# Patient Record
Sex: Female | Born: 1970
Health system: Southern US, Community
[De-identification: ages and names within clinical notes are randomized; demographics above are authoritative.]

## PROBLEM LIST (undated history)

## (undated) DIAGNOSIS — I10 Essential (primary) hypertension: Secondary | ICD-10-CM

## (undated) DIAGNOSIS — R55 Syncope and collapse: Secondary | ICD-10-CM

## (undated) DIAGNOSIS — E0590 Thyrotoxicosis, unspecified without thyrotoxic crisis or storm: Secondary | ICD-10-CM

## (undated) DIAGNOSIS — K047 Periapical abscess without sinus: Secondary | ICD-10-CM

## (undated) DIAGNOSIS — K589 Irritable bowel syndrome without diarrhea: Secondary | ICD-10-CM

## (undated) DIAGNOSIS — E0500 Thyrotoxicosis with diffuse goiter without thyrotoxic crisis or storm: Secondary | ICD-10-CM

## (undated) DIAGNOSIS — E039 Hypothyroidism, unspecified: Secondary | ICD-10-CM

## (undated) DIAGNOSIS — F419 Anxiety disorder, unspecified: Secondary | ICD-10-CM

## (undated) DIAGNOSIS — M549 Dorsalgia, unspecified: Secondary | ICD-10-CM

## (undated) DIAGNOSIS — N809 Endometriosis, unspecified: Secondary | ICD-10-CM

## (undated) DIAGNOSIS — R739 Hyperglycemia, unspecified: Secondary | ICD-10-CM

## (undated) DIAGNOSIS — F4321 Adjustment disorder with depressed mood: Secondary | ICD-10-CM

## (undated) DIAGNOSIS — Z87442 Personal history of urinary calculi: Secondary | ICD-10-CM

## (undated) DIAGNOSIS — E559 Vitamin D deficiency, unspecified: Secondary | ICD-10-CM

## (undated) DIAGNOSIS — Z87440 Personal history of urinary (tract) infections: Secondary | ICD-10-CM

## (undated) HISTORY — DX: Hyperglycemia, unspecified: R73.9

## (undated) HISTORY — DX: Essential (primary) hypertension: I10

## (undated) HISTORY — DX: Endometriosis, unspecified: N80.9

## (undated) HISTORY — DX: Syncope and collapse: R55

## (undated) HISTORY — DX: Dorsalgia, unspecified: M54.9

## (undated) HISTORY — DX: Vitamin D deficiency, unspecified: E55.9

## (undated) HISTORY — DX: Anxiety disorder, unspecified: F41.9

## (undated) HISTORY — DX: Thyrotoxicosis, unspecified without thyrotoxic crisis or storm: E05.90

## (undated) HISTORY — DX: Personal history of urinary (tract) infections: Z87.440

## (undated) HISTORY — DX: Irritable bowel syndrome, unspecified: K58.9

## (undated) HISTORY — PX: ABDOMINAL HYSTERECTOMY: SHX81

## (undated) HISTORY — DX: Personal history of urinary calculi: Z87.442

## (undated) HISTORY — DX: Adjustment disorder with depressed mood: F43.21

## (undated) HISTORY — DX: Periapical abscess without sinus: K04.7

## (undated) HISTORY — DX: Thyrotoxicosis with diffuse goiter without thyrotoxic crisis or storm: E05.00

---
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
## 1996-07-19 HISTORY — PX: OTHER SURGICAL HISTORY: SHX169

## 2001-07-19 DIAGNOSIS — Z87442 Personal history of urinary calculi: Secondary | ICD-10-CM

## 2001-07-19 HISTORY — DX: Personal history of urinary calculi: Z87.442

## 2007-01-17 HISTORY — PX: ENDOMETRIAL ABLATION: SHX621

## 2007-04-25 ENCOUNTER — Other Ambulatory Visit: Payer: Self-pay

## 2007-04-26 ENCOUNTER — Inpatient Hospital Stay: Payer: Self-pay

## 2008-05-16 ENCOUNTER — Inpatient Hospital Stay (HOSPITAL_COMMUNITY): Admission: AD | Admit: 2008-05-16 | Discharge: 2008-05-17 | Payer: Self-pay | Admitting: Obstetrics & Gynecology

## 2008-06-12 ENCOUNTER — Ambulatory Visit: Payer: Self-pay | Admitting: Obstetrics & Gynecology

## 2008-07-19 HISTORY — PX: VAGINAL HYSTERECTOMY: SHX2639

## 2008-08-08 ENCOUNTER — Ambulatory Visit: Payer: Self-pay | Admitting: Obstetrics and Gynecology

## 2008-08-08 ENCOUNTER — Encounter: Payer: Self-pay | Admitting: Obstetrics & Gynecology

## 2008-08-09 ENCOUNTER — Encounter: Payer: Self-pay | Admitting: Obstetrics and Gynecology

## 2008-08-29 ENCOUNTER — Ambulatory Visit: Payer: Self-pay | Admitting: Obstetrics & Gynecology

## 2008-10-08 ENCOUNTER — Ambulatory Visit (HOSPITAL_COMMUNITY): Admission: RE | Admit: 2008-10-08 | Discharge: 2008-10-09 | Payer: Self-pay | Admitting: Obstetrics & Gynecology

## 2008-10-08 ENCOUNTER — Ambulatory Visit: Payer: Self-pay | Admitting: Obstetrics & Gynecology

## 2008-10-08 ENCOUNTER — Encounter: Payer: Self-pay | Admitting: Obstetrics & Gynecology

## 2008-10-15 ENCOUNTER — Ambulatory Visit: Payer: Self-pay | Admitting: Obstetrics & Gynecology

## 2008-10-23 ENCOUNTER — Ambulatory Visit: Payer: Self-pay | Admitting: Obstetrics and Gynecology

## 2008-11-13 ENCOUNTER — Ambulatory Visit: Payer: Self-pay | Admitting: Obstetrics & Gynecology

## 2008-11-14 ENCOUNTER — Encounter: Payer: Self-pay | Admitting: Obstetrics & Gynecology

## 2008-11-14 LAB — CONVERTED CEMR LAB: GC Probe Amp, Genital: NEGATIVE

## 2008-11-15 ENCOUNTER — Encounter: Payer: Self-pay | Admitting: Obstetrics & Gynecology

## 2008-11-15 LAB — CONVERTED CEMR LAB
Clue Cells Wet Prep HPF POC: NONE SEEN
Trich, Wet Prep: NONE SEEN

## 2009-07-03 ENCOUNTER — Ambulatory Visit: Payer: Self-pay | Admitting: General Practice

## 2009-11-06 ENCOUNTER — Other Ambulatory Visit: Payer: Self-pay | Admitting: Physician Assistant

## 2010-02-02 ENCOUNTER — Inpatient Hospital Stay (HOSPITAL_COMMUNITY): Admission: AD | Admit: 2010-02-02 | Discharge: 2010-02-02 | Payer: Self-pay | Admitting: Obstetrics & Gynecology

## 2010-02-11 ENCOUNTER — Ambulatory Visit: Payer: Self-pay | Admitting: Obstetrics and Gynecology

## 2010-02-20 ENCOUNTER — Ambulatory Visit (HOSPITAL_COMMUNITY): Admission: RE | Admit: 2010-02-20 | Discharge: 2010-02-20 | Payer: Self-pay | Admitting: Family Medicine

## 2010-02-26 ENCOUNTER — Ambulatory Visit: Payer: Self-pay | Admitting: Obstetrics & Gynecology

## 2010-06-08 ENCOUNTER — Ambulatory Visit: Payer: Self-pay | Admitting: Internal Medicine

## 2010-06-08 ENCOUNTER — Inpatient Hospital Stay: Payer: Self-pay | Admitting: Internal Medicine

## 2010-06-14 ENCOUNTER — Encounter: Payer: Self-pay | Admitting: Internal Medicine

## 2010-06-16 ENCOUNTER — Telehealth: Payer: Self-pay | Admitting: Internal Medicine

## 2010-06-29 ENCOUNTER — Ambulatory Visit (HOSPITAL_COMMUNITY)
Admission: RE | Admit: 2010-06-29 | Discharge: 2010-06-29 | Payer: Self-pay | Source: Home / Self Care | Attending: Internal Medicine | Admitting: Internal Medicine

## 2010-07-07 ENCOUNTER — Ambulatory Visit: Payer: Self-pay | Admitting: Unknown Physician Specialty

## 2010-07-15 ENCOUNTER — Ambulatory Visit: Payer: Self-pay | Admitting: Vascular Surgery

## 2010-08-09 ENCOUNTER — Encounter: Payer: Self-pay | Admitting: *Deleted

## 2010-08-18 NOTE — Progress Notes (Signed)
Summary: Table test  Phone Note Call from Patient Call back at Home Phone [phone]   Caller: self Call For: Graciela Husbands Summary of Call: Pt was discharged from [facility] on Sunday and was told by Graciela Husbands that she needed to have a table test performed-She is calling to schedule. Initial call taken by: Harlon Flor,  June 16, 2010 2:45 PM  Follow-up for Phone Call        Attempted to call Tresa Endo or Bjorn Loser to schedule TTS, out of office today.  Attempted to contact pt, LMOM TCB.  Working on scheduling appt. Follow-up by: Cloyde Reams RN,  June 18, 2010 8:46 AM  Additional Follow-up for Phone Call Additional follow up Details #1::        Spoke with pt, she is aware we are working on scheduling TTS Cloyde Reams RN  June 18, 2010 9:42 AM   pt calling back wanting to know when she can be set up for the tilt table test? pls call [phone] Glynda Jaeger  June 24, 2010 1:42 PM  Called EP lab scheduled TTS for 06/29/10 at 10am pt arrival time 8 am. Pt notified of TTS appt. Pt instructions given over the phone. Additional Follow-up by: Lanny Hurst RN,  June 24, 2010 2:19 PM

## 2010-08-20 NOTE — Letter (Signed)
SummaryScientist, [facility]   [facility]   Imported By: Roderic Ovens 07/01/2010 10:31:28  _____________________________________________________________________  External Attachment:    Type:   Image     Comment:   External Document

## 2010-09-10 ENCOUNTER — Emergency Department (HOSPITAL_COMMUNITY)
Admission: EM | Admit: 2010-09-10 | Discharge: 2010-09-10 | Disposition: A | Discharge status: Home / Self Care | Payer: PRIVATE HEALTH INSURANCE | Attending: Emergency Medicine | Admitting: Emergency Medicine

## 2010-09-10 DIAGNOSIS — T148XXA Other injury of unspecified body region, initial encounter: Secondary | ICD-10-CM | POA: Insufficient documentation

## 2010-09-10 DIAGNOSIS — S0510XA Contusion of eyeball and orbital tissues, unspecified eye, initial encounter: Secondary | ICD-10-CM | POA: Insufficient documentation

## 2010-09-10 DIAGNOSIS — E039 Hypothyroidism, unspecified: Secondary | ICD-10-CM | POA: Insufficient documentation

## 2010-09-10 DIAGNOSIS — H5710 Ocular pain, unspecified eye: Secondary | ICD-10-CM | POA: Insufficient documentation

## 2010-09-10 DIAGNOSIS — S1093XA Contusion of unspecified part of neck, initial encounter: Secondary | ICD-10-CM | POA: Insufficient documentation

## 2010-09-10 DIAGNOSIS — R0609 Other forms of dyspnea: Secondary | ICD-10-CM | POA: Insufficient documentation

## 2010-09-10 DIAGNOSIS — S0003XA Contusion of scalp, initial encounter: Secondary | ICD-10-CM | POA: Insufficient documentation

## 2010-09-10 DIAGNOSIS — R220 Localized swelling, mass and lump, head: Secondary | ICD-10-CM | POA: Insufficient documentation

## 2010-09-10 DIAGNOSIS — Z79899 Other long term (current) drug therapy: Secondary | ICD-10-CM | POA: Insufficient documentation

## 2010-09-10 DIAGNOSIS — R0989 Other specified symptoms and signs involving the circulatory and respiratory systems: Secondary | ICD-10-CM | POA: Insufficient documentation

## 2010-09-10 DIAGNOSIS — R221 Localized swelling, mass and lump, neck: Secondary | ICD-10-CM | POA: Insufficient documentation

## 2010-10-28 LAB — POCT URINALYSIS DIP (DEVICE)
Nitrite: NEGATIVE
Protein, ur: NEGATIVE mg/dL
Urobilinogen, UA: 0.2 mg/dL (ref 0.0–1.0)
pH: 6.5 (ref 5.0–8.0)

## 2010-10-29 LAB — CBC
HCT: 30 % — ABNORMAL LOW (ref 36.0–46.0)
HCT: 40 % (ref 36.0–46.0)
Hemoglobin: 10.3 g/dL — ABNORMAL LOW (ref 12.0–15.0)
Hemoglobin: 13.7 g/dL (ref 12.0–15.0)
MCHC: 34.3 g/dL (ref 30.0–36.0)
MCV: 93.5 fL (ref 78.0–100.0)
MCV: 95 fL (ref 78.0–100.0)
RBC: 3.15 MIL/uL — ABNORMAL LOW (ref 3.87–5.11)
RDW: 11.9 % (ref 11.5–15.5)
WBC: 12.8 10*3/uL — ABNORMAL HIGH (ref 4.0–10.5)
WBC: 8.6 10*3/uL (ref 4.0–10.5)

## 2010-10-29 LAB — TYPE AND SCREEN: Antibody Screen: NEGATIVE

## 2010-10-29 LAB — ABO/RH: ABO/RH(D): O POS

## 2010-11-02 LAB — POCT URINALYSIS DIP (DEVICE)
Glucose, UA: NEGATIVE mg/dL
Ketones, ur: NEGATIVE mg/dL
Urobilinogen, UA: 0.2 mg/dL (ref 0.0–1.0)

## 2010-11-03 LAB — POCT URINALYSIS DIP (DEVICE)
Glucose, UA: NEGATIVE mg/dL
Ketones, ur: NEGATIVE mg/dL

## 2010-12-01 NOTE — Group Therapy Note (Signed)
NAME:  Katie Henry, Katie Henry NO.:  [id number]   MEDICAL RECORD NO.:  [id number]          PATIENT TYPE:  WOC   LOCATION:  [facility]                   FACILITY:  [facility]   PHYSICIAN:  Allie Bossier, MD        DATE OF BIRTH:  12-14-1970   DATE OF SERVICE:                                  CLINIC NOTE   [name] is a 40 year old lady who was seen by Dr. Silas Flood on August 08, 2008.  She has scheduled her for a laparoscopic-assisted vaginal  hysterectomy and started her on Vicodin and Voltaren for her pelvic pain  as well as Xanax to help with her anxiety.  She comes in today for  followup on this.  She said that the anxiety is relieved with the Xanax  and she said that her surgery has been scheduled for March 23.  She does  report some dysuria.  Please note that she was treated for a UTI at her  last visit.  I will reculture her urine today.  Her urinalysis only  shows a small bit of leukocyte esterase today and negative nitrite.  She  will follow up as above.      Allie Bossier, MD     MCD/MEDQ  D:  08/29/2008  T:  08/29/2008  Job:  [id number]

## 2010-12-01 NOTE — Discharge Summary (Signed)
NAMESAYLER, MICKIEWICZ               ACCOUNT NO.:  [id number]   MEDICAL RECORD NO.:  [id number]          PATIENT TYPE:  INP   LOCATION:  9319                          [facility]   PHYSICIAN:  Norton Blizzard, MD    DATE OF BIRTH:  29-Nov-1970   DATE OF ADMISSION:  10/08/2008  DATE OF DISCHARGE:  10/09/2008                               DISCHARGE SUMMARY   ADMISSION DIAGNOSIS:  Chronic pelvic pain.   DISCHARGE DIAGNOSIS:  Endometriosis.   PROCEDURES:  Laparoscopic-assisted vaginal hysterectomy on October 08, 2008.   PERTINENT STUDIES:  Preoperative hemoglobin of 13.7, postoperative  hemoglobin of 10.3.   BRIEF HOSPITAL COURSE:  The patient is a 40 year old gravida 3, para 2-0-  1-2 with long history of chronic pelvic pain, who desires definitive  surgical management in the form of a hysterectomy.  The patient was  counseled regarding adding a laparoscopy to her vaginal hysterectomy in  order to rule out endometriosis during surgery.  Endometriosis was  diagnosed during laparoscopy and she had a total vaginal hysterectomy.  For further details of this operation please refer to separate dictated  operative report.  On postoperative day #1, the patient was doing well.  She had an uncomplicated postoperative course.  She ambulated, voided  without difficulty, was passing flatus and her pain was controlled on  oral pain medications.  She was also tolerating a regular diet.  The  patient was deemed stable for discharge to home.   DISCHARGE CONDITION:  Stable.   DISCHARGE MEDICATIONS:  1. Dilaudid 2-4 mg p.o. q.4 h. p.r.n. pain.  2. Ibuprofen 600 mg p.o. q.6 h. p.r.n. pain.  3. Colace 100 mg p.o. b.i.d. p.r.n. constipation.  4. Nizoral 2% cream topical b.i.d. p.r.n. for skin irritation.   DISCHARGE INSTRUCTIONS:  The patient was given routine postoperative  instructions and told to avoid sexual intercourse for 6 weeks.  She was  advised to avoid lifting anything greater than 15  pounds for the next 6  weeks and to shower but not to sit in pools of fluid (bath tubs,  swimming pools, hot tubs etc).  She was also instructed to keep her  umbilical incision clean and dry and to come to MAU for any abnormal  bleeding, incisional drainage, infection or any other postoperative  concerns.   FOLLOWUP APPOINTMENTS:  The patient has her 2-week postop check on October 23, 2008 at 1:45 p.m. and her postoperative examination on November 13, 2008  at 2:15 p.m.      Norton Blizzard, MD  Electronically Signed     UAD/MEDQ  D:  10/09/2008  T:  10/10/2008  Job:  [id number]

## 2010-12-01 NOTE — Group Therapy Note (Signed)
NAME:  Katie Henry, Katie Henry NO.:  [id number]   MEDICAL RECORD NO.:  [id number]          PATIENT TYPE:  WOC   LOCATION:  [facility]                   FACILITY:  [facility]   PHYSICIAN:  Johnella Moloney, MD        DATE OF BIRTH:  1970/11/05   DATE OF SERVICE:  11/13/2008                                  CLINIC NOTE   The patient is a 40 year old female who underwent LAVH for chronic  pelvic pain by Dr. Johnella Moloney.  The patient had benign hysterectomy  findings with some adenomyosis.  The patient was having some pain at the  umbilical site but this looks well-healed.  No erythema, no discharge.  The patient is still having some pain vaginally, has not had sexual  intercourse yet.  We went a UA, urine culture to make sure this is not  urinary tract infection.  The patient will go back to work next week and  is going to be cleared for physical activity.   PHYSICAL EXAMINATION:  VITAL SIGNS:  Temperature 97.2, pulse 70, blood  pressure 132/94, weight 173.4, height 63 inches.  GENERAL:  Well-nourished, well-developed, in no apparent distress.  ABDOMEN:  Soft, nontender, no organomegaly, no hernia, no rebound, no  guarding.  All umbilical incisions healing well.  Speculum exam Tanner  5.  Vagina pink.  However, there is more discharge than expected.  We  sent wet prep and GC/chlamydia.  Bimanual, I still feel suture in the  left apex and the patient is a little tender on exam.  I would recommend  that the patient not have sexual intercourse for 2 more weeks.   ASSESSMENT AND PLAN:  A 40 year old female postop from LAVH for  adenomyosis.  1. GC/chlamydia and wet prep.  2. Urinalysis and urine culture.  3. Return to work next week.  4. Hold off on sexual intercourse for 2 weeks.     ______________________________  Elsie Lincoln, MD    ______________________________  Johnella Moloney, MD    KL/MEDQ  D:  11/13/2008  T:  11/13/2008  Job:  [id number]

## 2010-12-01 NOTE — Group Therapy Note (Signed)
Katie Henry, Katie Henry               ACCOUNT NO.:  [id number]   MEDICAL RECORD NO.:  [id number]          PATIENT TYPE:  WOC   LOCATION:  [facility]                   [facility]ACILITY:  [facility]   PHYSICIAN:  Johnella Moloney, MD        DATE OF BIRTH:  [birth year]   DATE OF SERVICE:  08/08/2008                                  CLINIC NOTE   CHIEF COMPLAINT:  Chronic pelvic pain.   HISTORY OF PRESENT ILLNESS:  Patient is a 40 year old gravida 3, para 2-  0-1-2 who is here today for a surgical consult regarding chronic pelvic  pain.  She was last seen by Dr. Perlie Gold on June 12, 2008, at which  point she was reported having moderate pelvic pain.  The pain is  describes as crampy in nature and severe, it is associated mostly with  her periods, but over the last month has become constant.  In the  beginning she used to be able to control her pain with some ibuprofen  but now, even when she takes 600 mg of ibuprofen, it is not able to  control her pain.  She takes a tablet of Vicodin when the pain gets to  be unbearable, it is interfering with her work and her life at home.  She says that she has become very anxious because of this pain and  really wants a hysterectomy.  Of note, patient has had an ablation done  in July 2008 for bleeding.  She said that even though she does not have  any bleeding she still has cyclical pain which is worse than it was  prior to her ablation.  On a recent ultrasound that was done on May 16, 2008, had her ultrasound was remarkable for about a 10-week sized  uterus with adenomyosis, no other abnormalities were seen and there were  normal adnexa bilaterally.  The patient also reports today that she has  been having urinary pressure, frequency, tenderness and pain at the end  of her urination for a few days.  She wants to be worked up for a  possible urinary tract infection.   PAST MEDICAL HISTORY:  1. Kidney stones.  2. Hypothyroidism.   PAST SURGICAL HISTORY:  1.  Appendectomy.  2. D and C.  3. Lithotripsy.  4. Bilateral tubal ligation.   PAST OB/GYN HISTORY:  Patient reported regular menstrual periods  initially, but she had menometrorrhagia which was caused by her  hypothyroidism and was alleviated by her ablation in 2008.  She is not  sexually active currently.  She had two vaginal deliveries and one  termination.  Her last Pap smear was in July 2008 and was normal.  She  does report a history of an abnormal Pap smear in 2002 which was treated  by follow-up repeat Paps which were normal.   MEDICATIONS:  1. Motrin p.r.n.  2. Dramamine p.r.n. nausea.  3. Synthroid 150 mg p.o. daily.  4. Vitamin D 50,000 international units every month.   ALLERGIES:  1. AMOXICILLIN, which causes nausea.  2. CODEINE, which causes nausea.  The patient does not have an allergy to  latex.   SOCIAL HISTORY:  The patient lives with her two sons.  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  She does not  smoke.  She only has one alcoholic drink per week.  She denies any  illicit drugs, denies any past or current history of sexual or physical  abuse.   FAMILIAL HISTORY:  Remarkable for cervical cancer in a paternal aunt,  prostate cancer in a paternal grandfather, extensive history of  diabetes, heart disease and high blood pressure and thyroid disease.   REVIEW OF SYSTEMS:  Patient reports having abdominal pain, urinary  frequency tenderness and dysuria.   PHYSICAL EXAMINATION:  Temperature 97.1, pulse 82, blood pressure  125/89, weight 172.6 pounds, height 5 feet 3 inches.  GENERAL:  No apparent distress.  HEENT:  Normocephalic, atraumatic.  NECK:  Supple.  No masses.  Normal thyroid.  LUNGS:  Clear to auscultation bilaterally.  HEART:  Regular rate and rhythm.  ABDOMEN:  Soft, moderate tenderness to palpation in lower abdomen.  No  rebound or guarding.  EXTREMITIES:  No cyanosis, clubbing or edema.  PELVIC:  Normal external  female genitalia.  Pink, well rugated vagina,  no lesions.  Pap smear was obtained.  Of note, patient was very  uncomfortable with entire Pap smear, especially manipulation of her  cervix.  On bimanual exam, patient was tender diffusely over the uterus  and bilateral adnexa, no abnormal masses were palpated.   ASSESSMENT/PLAN:  The patient is a 40 year old gravida 3 para 2-0-1-2  here with chronic pelvic pain.  Of note, urinalysis was done in the  clinic which was positive for nitrites and positive for leukocytes and  because of her urinary tract infection patient was given a prescription  for levofloxacin 500 mg by mouth daily for 7 days.  As for her pelvic  pain, patient is interested in getting a hysterectomy for her pain.  The  risks of surgery, including bleeding, infection, injury to surrounding  organs, need for additional procedures, were discussed with the patient.  She does want minimally invasive surgery and so given that she has a  normal-sized uterus she was told that we will proceed with a  laparoscopic-assisted vaginal hysterectomy.  I also told the patient  about the risks of conversion to laparotomy.  All questions about the  surgery were answered.  She was told that she will be contacted by a  surgical scheduler soon for a time and date of her surgery.  For her  pelvic pain in the meantime, patient was given a prescription for  Vicodin 30 tablets, Voltaren 50 mg 1-2 tablets by mouth two times daily  as needed for pain.  As for her anxiety, the patient was given a  prescription for Xanax and told that this should help with her anxiety;  however, if her symptoms worsen, or are not alleviated by this, that she  would need a referral to a psychologist or psychiatrist for further  management.  The patient will follow up in 3-4 weeks to see if her  symptoms are better and also to verify the time and date for her  upcoming surgery.           ______________________________   Johnella Moloney, MD    UD/MEDQ  D:  08/08/2008  T:  08/08/2008  Job:  [id number]

## 2010-12-01 NOTE — Op Note (Signed)
Katie Henry, LINDQUIST               ACCOUNT NO.:  [id number]   MEDICAL RECORD NO.:  [id number]          PATIENT TYPE:  INP   LOCATION:  9319                          [facility]   PHYSICIAN:  Norton Blizzard, MD    DATE OF BIRTH:  1971-01-19   DATE OF PROCEDURE:  10/08/2008  DATE OF DISCHARGE:                               OPERATIVE REPORT   PREOPERATIVE DIAGNOSIS:  Chronic pelvic pain.   POSTOPERATIVE DIAGNOSES:  Chronic pelvic pain, endometriosis.   PROCEDURE:  Laparoscopic-assisted vaginal hysterectomy.   SURGEON:  Norton Blizzard, MD.   ASSISTANT:  Scheryl Darter, MD   ANESTHESIA:  General.   IV FLUIDS:  3 L of lactated Ringer's.   ESTIMATED BLOOD LOSS:  300 mL.   URINE OUTPUT:  300 mL of clear yellow urine.   INDICATIONS:  The patient is a 40 year old gravida 3 para 2-0-1-2 with  chronic pelvic pain.  The patient had a history of dysfunctional uterine  bleeding, but underwent an ablation in July 2008 for bleeding.  Despite  the ablation, she feels that she has cyclical pain which is worst than  it was prior to her ablation.  Of note, the patient also had a bilateral  tubal ligation.  On recent ultrasound, her uterus was about 10 week size  with adenomyosis, and normal adnexa bilaterally.  The patient opted for  definitive surgical management and also evaluation for possible  endometriosis prior to surgery.  The risks of surgery including, but not  limited to bleeding which might require transfusion, infection which  might require antibiotics, injury to surrounding internal organs, need  for additional procedures, and possible need for conversion to  laparotomy were all explained and informed written consent was obtained.   FINDINGS:  10 weeks' size uterus.  There were clear endometriotic  implants on the uterosacral ligaments and the posterior cul-de-sac.  There was some adhesions of the omentum to the posterior cul-de-sac.  Bilateral adnexae were normal,  and there  were no endometriotic implants  that were visualized around the adnexae and the upper abdomen was also  normal.   SPECIMENS:  Uterus and cervix.   DISPOSITION:  Specimens to pathology.   COMPLICATIONS:  None immediately after the case.   PROCEDURE DETAILS:  The patient received preoperative intravenous  cefotetan and had sequential compressive devices applied to the lower  extremities while in the preoperative area.  She was then taken to the  operating room where general anesthesia was administered and found to be  adequate.  The patient was placed in the dorsal lithotomy position, and  prepped and draped in a sterile manner.  A Foley catheter was inserted  to the patient's bladder and attached to constant gravity and the  uterine manipulator was also placed at this point.  Attention was turned  to the patient's abdomen where an umbilical incision was made with a  scalpel.  A Veress needle was inserted and correct intraperitoneal  placement was noted upon insufflation with carbon dioxide gas as the  opening pressure was 3 mmHg.  The abdomen was insufflated to 15  mmHg and  the Veress needle was removed and a 10-mm trocar was placed in the  umbilical port.  The operative laparoscope was then placed into the  abdomen and general survey of the abdomen and pelvis showed some  implants of endometriosis that were noted involving the uterosacral  ligaments and also the posterior cul-de-sac.  There were also omental  adhesions that were noted on the posterior cul-de-sac.  Bilateral adnexa  were found to be normal and the uterus was found to be normal, but there  was a question of the endometriotic implants seen on the uterus.  A  survey of the upper abdomen was found to be normal.  At this point, the  abdomen was desufflated, but the trocar was left in place and attention  was then turned to the patient's pelvis for the vaginal hysterectomy.  The uterine manipulator was removed from the  patient's vagina and a long  weighted speculum was then placed.  The cervix was grasped with a  tenaculum and 0.25% Marcaine with epinephrine was injected  circumferentially on the cervicovaginal junction.  The cervicovaginal  junction was also then incised with a scalpel in a circumferential  manner and an incision was made on the anterior part of the  cervicovaginal junction.  Using a combination of blunt to sharp methods,  an opening into the vesicouterine peritoneum was then obtained and a  Deaver retractor was placed.  This was also done posteriorly and there  was entry into the rectovaginal peritoneum; the longer weighted speculum  was placed at this point.  Heaney clamps were used to clamp the  uterosacral ligaments, cut, and suture ligated bilaterally.  Overall  good hemostasis was noted.  The cardinal ligaments were then clamped,  cut, and suture ligated, and then the uterine arteries were clamped,  cut, and suture ligated.  Of note, the suture that was used in the  surgery is 0 Vicryl unless otherwise stated.  The rest of the broad  ligament was then serially clamped, cut, and suture ligated, and then  adnexa pedicles, as well as in the utero-ovarian ligaments were then  clamped, cut, and tied with a free tie, and then suture ligated.  Excellent hemostasis was noted on all pedicles.  The vesicouterine  peritoneum and the rectovaginal peritoneum were then reapproximated with  pursestring suture of 2-0 Vicryl with care given to incorporate the  pedicles on each side and excellent peritoneal closure was noted.  The  bilateral uterosacral ligaments were then transfixed to the upper and  lower vaginal cuffs to obtain good apical support.  Hemostasis was then  reassured and on all surfaces and the vaginal cuff was reapproximated  with 0 Vicryl figure-of-eight stitches.  At this point, all the  instruments were removed from the patient's vagina and attention was  then turned back to  the patient's abdomen where it was insufflated again  and the laparoscope was used to look at the area of surgery.  The  vaginal cuff was noted to be closed, and there was no injury to bowel or  other surrounding organs.  There was minimal blood in her abdomen as a  result of surgery and no other bleeding surfaces were noted.  Overall,  good hemostasis was noted.  The abdomen was then desufflated and the  trocar was removed.  The fascial incision on the umbilical port site was  reapproximated using a interrupted figure-of-eight stitch of 0 Vicryl  and the skin was reapproximated with 4-0 Vicryl  interrupted sutures.  The patient tolerated the procedure well.  Sponge, instrument, and  needle counts were correct x3.  She was taken to the recovery room  extubated, awake, and in stable condition.      Norton Blizzard, MD  Electronically Signed     UAD/MEDQ  D:  10/08/2008  T:  10/09/2008  Job:  [id number]

## 2010-12-01 NOTE — Group Therapy Note (Signed)
NAME:  Katie Henry, Katie Henry NO.:  [id number]   MEDICAL RECORD NO.:  [id number]          PATIENT TYPE:  WOC   LOCATION:  [facility]                   FACILITY:  [facility]   PHYSICIAN:  Dorthula Perfect, MD     DATE OF BIRTH:  18-Feb-1971   DATE OF SERVICE:  06/12/2008                                  CLINIC NOTE   [age]-year-old white female is seen here today for followup from  a visit at the end of October in the MAU.  She has been having a problem  with abdominal pain.  As best as I can elicit, she was found to have  fibroids in her pregnancy in 2003.  Because of cramping pain and  bleeding problems she had an ablation performed in July 2008.  She has  had no vaginal bleeding since that procedure was done.  Every month she  had a couple days of bad pelvic pain with the second day being really  bad.  This is much more than she had before she had the ablation.  She  is starting to have the pain again today.  When she was seen in the MAU,  she had been using Lortab.  The MAU obtained a CT of the pelvis which  was normal.  It did not reveal anything abnormal with the uterus or  ovaries.  She also had a transvaginal ultrasound which did not reveal  any acute problems.  There was slight calcification noted in the  myometrium.  It stated it may also represent adenomyosis.  The ovaries  were normal.  There was no free fluid noted.   PAST MEDICAL HISTORY/OPERATIONS:   OPERATIONS:  Uterine ablation July of 2008, bilateral tubal  sterilization, appendectomy and lithotripsy.   Lab work when she was seen in the MAU was normal.   PHYSICAL EXAM:  Weight 175 pounds, blood pressure 135/94.  The patient  is not examined today as this was a followup visit.   IMPRESSION:  Pelvic pain, etiology unknown.  Quite possibly adenomyosis  and/or endometriosis.   DISPOSITION:  1. I have given prescription for Vicodin 5 mg 30 tablets with no      refills.  2. She is to get an appointment  with one of the gynecology surgeons      here in the clinic, probably next month, to evaluate for diagnostic      laparoscopy.  Whether her pain is simply because of the ablation      and a little bit of bleeding in the upper uterus that cannot      escape, or whether it is because of adenomyosis and/or      endometriosis will have to be decided.  I believe she will need the      laparoscopy and it would not surprise me if she requires      hysterectomy.           ______________________________  Dorthula Perfect, MD     ER/MEDQ  D:  06/12/2008  T:  06/12/2008  Job:  [id number]

## 2011-04-20 LAB — WET PREP, GENITAL
Clue Cells Wet Prep HPF POC: NONE SEEN
Yeast Wet Prep HPF POC: NONE SEEN

## 2011-04-20 LAB — POCT PREGNANCY, URINE: Preg Test, Ur: NEGATIVE

## 2011-04-20 LAB — COMPREHENSIVE METABOLIC PANEL
ALT: 14
Albumin: 3.4 — ABNORMAL LOW
Alkaline Phosphatase: 60
Calcium: 9
Chloride: 108
Creatinine, Ser: 0.69
GFR calc non Af Amer: >60
Glucose, Bld: 97
Potassium: 3.8
Sodium: 139

## 2011-04-20 LAB — CBC
MCHC: 34.1
MCV: 94.7
Platelets: 395
RBC: 4.24
WBC: 11.9 — ABNORMAL HIGH

## 2011-04-20 LAB — URINALYSIS, ROUTINE W REFLEX MICROSCOPIC
Leukocytes, UA: NEGATIVE
Specific Gravity, Urine: 1.01
pH: 6.5

## 2011-05-17 ENCOUNTER — Ambulatory Visit (INDEPENDENT_AMBULATORY_CARE_PROVIDER_SITE_OTHER): Payer: 59 | Admitting: Internal Medicine

## 2011-05-17 ENCOUNTER — Encounter: Payer: Self-pay | Admitting: Internal Medicine

## 2011-05-17 VITALS — BP 140/89 | HR 83 | Temp 97.8°F | Resp 18 | Ht 64.5 in | Wt 175.0 lb

## 2011-05-17 DIAGNOSIS — E039 Hypothyroidism, unspecified: Secondary | ICD-10-CM

## 2011-05-17 DIAGNOSIS — F329 Major depressive disorder, single episode, unspecified: Secondary | ICD-10-CM

## 2011-05-17 DIAGNOSIS — E559 Vitamin D deficiency, unspecified: Secondary | ICD-10-CM

## 2011-05-17 DIAGNOSIS — R309 Painful micturition, unspecified: Secondary | ICD-10-CM

## 2011-05-17 DIAGNOSIS — N23 Unspecified renal colic: Secondary | ICD-10-CM

## 2011-05-17 DIAGNOSIS — F32A Depression, unspecified: Secondary | ICD-10-CM

## 2011-05-17 LAB — POCT URINALYSIS DIPSTICK
Bilirubin, UA: NEGATIVE
Blood, UA: NEGATIVE
Leukocytes, UA: NEGATIVE
Urobilinogen, UA: 0.2

## 2011-05-17 LAB — TSH: TSH: 0.09 u[IU]/mL — ABNORMAL LOW (ref 0.350–4.500)

## 2011-05-17 LAB — T4, FREE: Free T4: 1.51 ng/dL (ref 0.80–1.80)

## 2011-05-17 MED ORDER — CITALOPRAM HYDROBROMIDE 20 MG PO TABS: 20.0000 mg | ORAL_TABLET | Freq: Every day | ORAL | Status: DC

## 2011-05-17 MED ORDER — CIPROFLOXACIN HCL 500 MG PO TABS: 500.0000 mg | ORAL_TABLET | Freq: Two times a day (BID) | ORAL | Status: AC

## 2011-05-18 LAB — VITAMIN D 25 HYDROXY (VIT D DEFICIENCY, FRACTURES): Vit D, 25-Hydroxy: 37 ng/mL (ref 30–89)

## 2011-05-22 NOTE — Progress Notes (Signed)
  Subjective:    Patient ID: Katie Henry, female    DOB: [birth year], 40 y.o.   MRN: [id number]  HPI Pt presents to clinic to establish care and for evaluation of multiple problems. H/o hypothyroidism with dose adjustment reported 7/12. H/o vit d def s/p ergocalciferol. Has h/o migraines s/p neurology evaluation. Notes increasing stress with depressed mood and irritability. States mind won't shut off at night and has difficulty sleeping. Does snore without apnea. [facility] for insomnia. Notes recent urinary discomfort and back pain without f/c or n/v. No alleviating or exacerbating factors. No other complaints.  Past Medical History  Diagnosis Date  . Fainting episodes     Dr Morton Peters related  . Migraine   . History of kidney stones 2003    onset with pregnacy   . Hyperthyroidism   . History of UTI    Past Surgical History  Procedure Date  . Appendectomy 1991  . Endometrial ablation 01/2007  . Vaginal hysterectomy 2010    ovaries still in place    reports that she has quit smoking. She has never used smokeless tobacco. She reports that she drinks alcohol. She reports that she does not use illicit drugs. family history includes Diabetes in her maternal grandmother; Heart attack in her maternal aunt; Heart disease in her maternal grandfather; Hyperlipidemia in her maternal grandfather, maternal grandmother, and mother; Hypertension in her father and paternal grandfather; and Prostate cancer in an unspecified family member. Allergies  Allergen Reactions  . Amoxicillin   . Codeine   . Ultram (Tramadol Hcl)        Review of Systems  Constitutional: Negative for fever and chills.  Respiratory: Negative for shortness of breath.   Cardiovascular: Negative for chest pain.  Genitourinary: Positive for dysuria. Negative for flank pain.  Musculoskeletal: Positive for back pain.  Psychiatric/Behavioral: Positive for dysphoric mood. Negative for suicidal ideas.  All other systems  reviewed and are negative.       Objective:   Physical Exam  Nursing note and vitals reviewed. Constitutional: She appears well-developed and well-nourished. No distress.  HENT:  Head: Normocephalic and atraumatic.  Right Ear: External ear normal.  Left Ear: External ear normal.  Eyes: Conjunctivae are normal. Right eye exhibits no discharge. Left eye exhibits no discharge. No scleral icterus.  Neck: Neck supple.  Cardiovascular: Normal rate, regular rhythm and normal heart sounds.  Exam reveals no gallop and no friction rub.   No murmur heard. Pulmonary/Chest: Effort normal and breath sounds normal. No respiratory distress. She has no wheezes. She has no rales.  Neurological: She is alert.  Skin: Skin is warm and dry. She is not diaphoretic.  Psychiatric: She has a normal mood and affect. Her behavior is normal.          Assessment & Plan:

## 2011-05-23 DIAGNOSIS — E039 Hypothyroidism, unspecified: Secondary | ICD-10-CM | POA: Insufficient documentation

## 2011-05-23 DIAGNOSIS — R309 Painful micturition, unspecified: Secondary | ICD-10-CM | POA: Insufficient documentation

## 2011-05-23 DIAGNOSIS — F419 Anxiety disorder, unspecified: Secondary | ICD-10-CM | POA: Insufficient documentation

## 2011-05-23 DIAGNOSIS — E559 Vitamin D deficiency, unspecified: Secondary | ICD-10-CM | POA: Insufficient documentation

## 2011-05-23 NOTE — Assessment & Plan Note (Signed)
Begin cipro x 5d. Followup if no improvement or worsening.

## 2011-05-23 NOTE — Assessment & Plan Note (Signed)
Begin celexa. Schedule close follow up

## 2011-05-23 NOTE — Assessment & Plan Note (Signed)
Obtain tsh and ft4

## 2011-05-23 NOTE — Assessment & Plan Note (Signed)
Obtain vit d level 

## 2011-05-25 ENCOUNTER — Encounter: Payer: Self-pay | Admitting: Internal Medicine

## 2011-05-25 DIAGNOSIS — E039 Hypothyroidism, unspecified: Secondary | ICD-10-CM

## 2011-05-25 MED ORDER — VITAMIN D (ERGOCALCIFEROL) 1.25 MG (50000 UNIT) PO CAPS: 50000.0000 [IU] | ORAL_CAPSULE | ORAL | Status: DC

## 2011-05-25 MED ORDER — LEVOTHYROXINE SODIUM 125 MCG PO CAPS: 125.0000 ug | ORAL_CAPSULE | Freq: Every day | ORAL | Status: DC

## 2011-05-25 MED ORDER — ZOLPIDEM TARTRATE 10 MG PO TABS: ORAL_TABLET | ORAL | Status: DC

## 2011-05-25 NOTE — Telephone Encounter (Signed)
Addended by: Mervin Kung A on: 05/25/2011 05:31 PM   Modules accepted: Orders

## 2011-05-25 NOTE — Telephone Encounter (Signed)
Please advise 

## 2011-05-28 ENCOUNTER — Encounter: Payer: Self-pay | Admitting: Family Medicine

## 2011-05-28 ENCOUNTER — Ambulatory Visit (INDEPENDENT_AMBULATORY_CARE_PROVIDER_SITE_OTHER): Payer: 59 | Admitting: Family Medicine

## 2011-05-28 VITALS — BP 121/83 | HR 79 | Temp 98.1°F | Ht 64.5 in | Wt 170.1 lb

## 2011-05-28 DIAGNOSIS — K044 Acute apical periodontitis of pulpal origin: Secondary | ICD-10-CM

## 2011-05-28 DIAGNOSIS — K047 Periapical abscess without sinus: Secondary | ICD-10-CM | POA: Insufficient documentation

## 2011-05-28 HISTORY — DX: Periapical abscess without sinus: K04.7

## 2011-05-28 MED ORDER — CLINDAMYCIN HCL 300 MG PO CAPS: 300.0000 mg | ORAL_CAPSULE | Freq: Four times a day (QID) | ORAL | Status: DC

## 2011-05-28 MED ORDER — ALIGN 4 MG PO CAPS: 1.0000 | ORAL_CAPSULE | Freq: Every day | ORAL | Status: DC

## 2011-05-28 NOTE — Progress Notes (Signed)
Katie Henry [id number] 1970/08/30 05/28/2011      Progress Note-Follow Up  Subjective  Chief Complaint  Chief Complaint  Patient presents with  . Dental Pain    X 7 days    HPI  Patient is in today c/o roughly 6 days of dental pain. This tooth was already evaluated by her dentist last month. She was told that she would be a root canal but at that time it was not bothering her. Unfortunately over the last few days she's had some increased dental pain. Yesterday she started noting swelling along her right jaw bone she's here today just to hopefully start antibiotics secondary to being able to see her dentist at that time. She denies fevers, chills. She's had some trouble with her headaches recently but she thinks it's lack of sleep due to other variables. She denies any head congestion, sore throat chest pain nausea or other signs of systemic illness. She does note some swelling and pain along the gum line.  Past Medical History  Diagnosis Date  . Fainting episodes     Dr Morton Peters related  . Migraine   . History of kidney stones 2003    onset with pregnacy   . Hyperthyroidism   . History of UTI   . Dental infection 05/28/2011    Past Surgical History  Procedure Date  . Appendectomy 1991  . Endometrial ablation 01/2007  . Vaginal hysterectomy 2010    ovaries still in place    Family History  Problem Relation Age of Onset  . Prostate cancer      maternal and paternal grandparents  . Hyperlipidemia Mother   . Hyperlipidemia Maternal Grandmother   . Hyperlipidemia Maternal Grandfather   . Heart disease Maternal Grandfather   . Heart attack Maternal Aunt     deceased age 17  . Hypertension Father   . Hypertension Paternal Grandfather   . Diabetes Maternal Grandmother     History   Social History  . Marital Status: Single    Spouse Name: N/A    Number of Children: N/A  . Years of Education: N/A   Occupational History  . Not on file.   Social History Main  Topics  . Smoking status: Former Games developer  . Smokeless tobacco: Never Used   Comment: quit smoking 6 years ago  . Alcohol Use: Yes  . Drug Use: No  . Sexually Active: Yes -- Female partner(s)   Other Topics Concern  . Not on file   Social History Narrative  . No narrative on file    Current Outpatient Prescriptions on File Prior to Visit  Medication Sig Dispense Refill  . Levothyroxine Sodium 125 MCG CAPS Take 1 capsule (125 mcg total) by mouth daily.  30 capsule  1  . Vitamin D, Ergocalciferol, (DRISDOL) 50000 UNITS CAPS Take 1 capsule (50,000 Units total) by mouth every 7 (seven) days.  4 capsule  2  . zolpidem (AMBIEN) 10 MG tablet Take 1/2 to 1 tablet by mouth at bedtime.  30 tablet  0  . citalopram (CELEXA) 20 MG tablet Take 1 tablet (20 mg total) by mouth daily.  30 tablet  6    Allergies  Allergen Reactions  . Amoxicillin   . Codeine   . Ultram (Tramadol Hcl)     Review of Systems  Review of Systems  Constitutional: Negative for fever and malaise/fatigue.  HENT: Negative for congestion.   Eyes: Negative for discharge.  Respiratory: Negative for shortness of breath.  Cardiovascular: Negative for chest pain, palpitations and leg swelling.  Gastrointestinal: Negative for nausea, abdominal pain and diarrhea.  Genitourinary: Negative for dysuria.  Musculoskeletal: Negative for falls.  Skin: Negative for rash.  Neurological: Negative for loss of consciousness and headaches.  Endo/Heme/Allergies: Negative for polydipsia.  Psychiatric/Behavioral: Negative for depression and suicidal ideas. The patient is not nervous/anxious and does not have insomnia.     Objective  BP 121/83  Pulse 79  Temp(Src) 98.1 F (36.7 C) (Oral)  Ht 5' 4.5" (1.638 m)  Wt 170 lb 1.9 oz (77.166 kg)  BMI 28.75 kg/m2  SpO2 100%  Physical Exam  Physical Exam  Constitutional: She is oriented to person, place, and time and well-developed, well-nourished, and in no distress. No distress.    HENT:  Head: Normocephalic and atraumatic.  Eyes: Conjunctivae are normal.  Neck: Neck supple. No thyromegaly present.  Cardiovascular: Normal rate, regular rhythm and normal heart sounds.   No murmur heard. Pulmonary/Chest: Effort normal and breath sounds normal. She has no wheezes.  Abdominal: She exhibits no distension and no mass.  Musculoskeletal: She exhibits no edema.  Lymphadenopathy:    She has no cervical adenopathy.  Neurological: She is alert and oriented to person, place, and time.  Skin: Skin is warm and dry. No rash noted. She is not diaphoretic.  Psychiatric: Memory, affect and judgment normal.    Lab Results  Component Value Date   TSH 0.090* 05/17/2011   Lab Results  Component Value Date   WBC 12.8* 10/09/2008   HGB 10.3 DELTA CHECK NOTED* 10/09/2008   HCT 30.0* 10/09/2008   MCV 95.0 10/09/2008   PLT 272 DELTA CHECK NOTED 10/09/2008   Lab Results  Component Value Date   CREATININE 0.69 05/16/2008   BUN 6 05/16/2008   NA 139 05/16/2008   K 3.8 05/16/2008   CL 108 05/16/2008   CO2 23 05/16/2008   Lab Results  Component Value Date   ALT 14 05/16/2008   AST 15 05/16/2008   ALKPHOS 60 05/16/2008   BILITOT 0.6 05/16/2008    Assessment & Plan  Dental infection Patient notes several days of increasing tooth pain. Over the past 24 hours it has worsened, she noted pain and swelling along her right lower jaw line yesterday. She has already been told by her dentist she needs a root canal in this particular right molar but is unable to get in there today. She will proceed with further dental work soon. She may alternate Ibuprofen and Acetaminophen as directed and she is started on Clindamycin 300mg  po qid x 10 days. She will eat a yogurt daily and start a probiotic cap as well

## 2011-05-28 NOTE — Patient Instructions (Signed)
Abscessed Tooth An abscessed tooth is an infection around your tooth. It may be caused by holes or damage to the tooth (cavity) or a dental disease. An abscessed tooth causes mild to very bad pain in and around the tooth. See your dentist right away if you have tooth or gum pain. HOME CARE  Take your medicine as told. Finish it even if you start to feel better.   Do not drive after taking pain medicine.   Rinse your mouth (gargle) often with salt water ( teaspoon salt in 8 ounces of warm water).   Do not apply heat to the outside of your face.  GET HELP RIGHT AWAY IF:   You have a temperature by mouth above 102 F (38.9 C), not controlled by medicine.   You have chills and a very bad headache.   You have problems breathing or swallowing.   Your mouth will not open.   You develop puffiness (swelling) on the neck or around the eye.   Your pain is not helped by medicine.   Your pain is getting worse instead of better.  MAKE SURE YOU:   Understand these instructions.   Will watch your condition.   Will get help right away if you are not doing well or get worse.  Document Released: 12/22/2007 Document Revised: 03/17/2011 Document Reviewed: 12/22/2007 [facility] Patient Information 2012 [place], [place].

## 2011-05-28 NOTE — Assessment & Plan Note (Addendum)
Patient notes several days of increasing tooth pain. Over the past 24 hours it has worsened, she noted pain and swelling along her right lower jaw line yesterday. She has already been told by her dentist she needs a root canal in this particular right molar but is unable to get in there today. She will proceed with further dental work soon. She may alternate Ibuprofen and Acetaminophen as directed and she is started on Clindamycin 300mg  po qid x 10 days. She will eat a yogurt daily and start a probiotic cap as well

## 2011-06-03 ENCOUNTER — Other Ambulatory Visit: Payer: Self-pay

## 2011-06-03 DIAGNOSIS — N760 Acute vaginitis: Secondary | ICD-10-CM

## 2011-06-03 MED ORDER — FLUCONAZOLE 150 MG PO TABS: ORAL_TABLET | ORAL | Status: DC

## 2011-06-03 NOTE — Telephone Encounter (Signed)
Sent Diflucan in for patient, developed vaginitis secondary to recent antibiotic use.

## 2011-06-03 NOTE — Telephone Encounter (Signed)
Pt would like something sent into [facility] for yeast infection. Please advise?

## 2011-06-07 ENCOUNTER — Telehealth: Payer: Self-pay

## 2011-06-07 DIAGNOSIS — K047 Periapical abscess without sinus: Secondary | ICD-10-CM

## 2011-06-07 MED ORDER — CLINDAMYCIN HCL 300 MG PO CAPS: 300.0000 mg | ORAL_CAPSULE | Freq: Four times a day (QID) | ORAL | Status: DC

## 2011-06-07 NOTE — Telephone Encounter (Signed)
Discussed with patient and have agreed to keep her on the Clindamycin until her root canal which is scheduled for 11/27, continue probiotic and alternating Tylenol and Ibuprofen for pain management

## 2011-06-07 NOTE — Telephone Encounter (Signed)
Pt states she is getting a root canal on November 27, 12. Pt states she would like an antibiotic to get her thru till then. Per Md give patient 40 more.

## 2011-06-22 ENCOUNTER — Encounter: Payer: Self-pay | Admitting: Internal Medicine

## 2011-06-24 ENCOUNTER — Ambulatory Visit: Payer: Self-pay | Admitting: Internal Medicine

## 2011-06-25 ENCOUNTER — Other Ambulatory Visit: Payer: Self-pay | Admitting: Internal Medicine

## 2011-06-25 DIAGNOSIS — E039 Hypothyroidism, unspecified: Secondary | ICD-10-CM

## 2011-07-08 ENCOUNTER — Encounter: Payer: Self-pay | Admitting: Internal Medicine

## 2011-07-08 ENCOUNTER — Ambulatory Visit (INDEPENDENT_AMBULATORY_CARE_PROVIDER_SITE_OTHER): Payer: 59 | Admitting: Internal Medicine

## 2011-07-08 ENCOUNTER — Other Ambulatory Visit: Payer: Self-pay | Admitting: Family Medicine

## 2011-07-08 ENCOUNTER — Other Ambulatory Visit: Payer: Self-pay | Admitting: *Deleted

## 2011-07-08 VITALS — BP 102/80 | HR 79 | Temp 98.2°F | Resp 18 | Wt 174.0 lb

## 2011-07-08 DIAGNOSIS — K047 Periapical abscess without sinus: Secondary | ICD-10-CM

## 2011-07-08 DIAGNOSIS — F329 Major depressive disorder, single episode, unspecified: Secondary | ICD-10-CM

## 2011-07-08 DIAGNOSIS — E039 Hypothyroidism, unspecified: Secondary | ICD-10-CM

## 2011-07-08 DIAGNOSIS — Z1239 Encounter for other screening for malignant neoplasm of breast: Secondary | ICD-10-CM

## 2011-07-08 DIAGNOSIS — F32A Depression, unspecified: Secondary | ICD-10-CM

## 2011-07-08 LAB — T4, FREE: Free T4: 0.8 ng/dL (ref 0.80–1.80)

## 2011-07-08 LAB — TSH: TSH: 14.147 u[IU]/mL — ABNORMAL HIGH (ref 0.350–4.500)

## 2011-07-08 MED ORDER — CLINDAMYCIN HCL 300 MG PO CAPS: 300.0000 mg | ORAL_CAPSULE | Freq: Four times a day (QID) | ORAL | Status: AC

## 2011-07-08 NOTE — Progress Notes (Signed)
  Subjective:    Patient ID: Katie Henry, female    DOB: Sep 30, 1970, 40 y.o.   MRN: [id number]  HPI Pt presents to clinic for followup of multiple medical problems. Attempted celexa but stopped due sedation. Notes mood improved and is able to talk with her minister. Reviewed last tsh minimally low with nl ft4. S/p dose decrease. Wt up four lbs but no other s/s of hypothyroidism. Needs annual mammogram scheduled. No other complaints.  Past Medical History  Diagnosis Date  . Fainting episodes     Dr Morton Peters related  . Migraine   . History of kidney stones 2003    onset with pregnacy   . Hyperthyroidism   . History of UTI   . Dental infection 05/28/2011   Past Surgical History  Procedure Date  . Appendectomy 1991  . Endometrial ablation 01/2007  . Vaginal hysterectomy 2010    ovaries still in place    reports that she has quit smoking. She has never used smokeless tobacco. She reports that she drinks alcohol. She reports that she does not use illicit drugs. family history includes Diabetes in her maternal grandmother; Heart attack in her maternal aunt; Heart disease in her maternal grandfather; Hyperlipidemia in her maternal grandfather, maternal grandmother, and mother; Hypertension in her father and paternal grandfather; and Prostate cancer in an unspecified family member. Allergies  Allergen Reactions  . Amoxicillin   . Codeine   . Ultram (Tramadol Hcl)       Review of Systems see hpi     Objective:   Physical Exam  Nursing note and vitals reviewed. Constitutional: She appears well-developed and well-nourished. No distress.  HENT:  Head: Normocephalic and atraumatic.  Right Ear: External ear normal.  Left Ear: External ear normal.  Eyes: Conjunctivae are normal. No scleral icterus.  Cardiovascular: Normal rate, regular rhythm and normal heart sounds.  Exam reveals no gallop and no friction rub.   No murmur heard. Pulmonary/Chest: Effort normal and breath sounds  normal. No respiratory distress. She has no wheezes. She has no rales.  Neurological: She is alert.  Skin: Skin is warm and dry. She is not diaphoretic.  Psychiatric: She has a normal mood and affect.          Assessment & Plan:

## 2011-07-08 NOTE — Assessment & Plan Note (Signed)
Obtain tsh/ft4 s/p dose change. asx

## 2011-07-08 NOTE — Assessment & Plan Note (Signed)
Schedule screening mammogram

## 2011-07-08 NOTE — Assessment & Plan Note (Signed)
Improved without medication.

## 2011-07-15 ENCOUNTER — Other Ambulatory Visit: Payer: Self-pay | Admitting: *Deleted

## 2011-07-15 DIAGNOSIS — E039 Hypothyroidism, unspecified: Secondary | ICD-10-CM

## 2011-07-15 MED ORDER — ZOLPIDEM TARTRATE 10 MG PO TABS: ORAL_TABLET | ORAL | Status: DC

## 2011-07-15 NOTE — Telephone Encounter (Signed)
Patient returned phone call to acknowledge message received. Katie Henry states Katie Henry forgot to ask Dr Rodena Medin for a refill on Ambien. Katie Henry was informed Rx would be sent to pharmacy.  Refill for Ambien left on [facility] voice line.

## 2011-09-13 ENCOUNTER — Encounter: Payer: Self-pay | Admitting: Internal Medicine

## 2011-09-13 ENCOUNTER — Other Ambulatory Visit: Payer: Self-pay | Admitting: *Deleted

## 2011-09-13 NOTE — Telephone Encounter (Signed)
Call placed to [facility], spoke with Sharyl Nimrod, she has verified Rx on file from December that is available for refill for Ambien. Call placed to patient she was informed of refill to [facility] pharmacy for Ambien.  No refills needed at this time.

## 2011-09-16 ENCOUNTER — Other Ambulatory Visit: Payer: Self-pay | Admitting: *Deleted

## 2011-09-16 DIAGNOSIS — E039 Hypothyroidism, unspecified: Secondary | ICD-10-CM

## 2011-09-21 ENCOUNTER — Encounter: Payer: Self-pay | Admitting: Internal Medicine

## 2011-09-21 ENCOUNTER — Telehealth: Payer: Self-pay | Admitting: *Deleted

## 2011-09-21 DIAGNOSIS — E039 Hypothyroidism, unspecified: Secondary | ICD-10-CM

## 2011-09-21 MED ORDER — LEVOTHYROXINE SODIUM 150 MCG PO TABS: 150.0000 ug | ORAL_TABLET | Freq: Every day | ORAL | Status: DC

## 2011-09-21 NOTE — Telephone Encounter (Signed)
Patient made aware via my chart message.

## 2011-09-21 NOTE — Telephone Encounter (Signed)
Patient sent my chart message requesting thyroid results and a refill on her thyroid medication. She states she has been out of her thyroid medication for a couple of days.

## 2011-09-21 NOTE — Telephone Encounter (Signed)
Both thyroid test underactive. Return back to qd dose. rf 6. tsh and free t4 in 8-10 wks hypothyroidism

## 2012-01-07 ENCOUNTER — Ambulatory Visit: Payer: 59 | Admitting: Internal Medicine

## 2012-01-13 ENCOUNTER — Encounter: Payer: Self-pay | Admitting: Internal Medicine

## 2012-01-13 ENCOUNTER — Telehealth: Payer: Self-pay | Admitting: Internal Medicine

## 2012-01-13 ENCOUNTER — Ambulatory Visit (INDEPENDENT_AMBULATORY_CARE_PROVIDER_SITE_OTHER): Payer: 59 | Admitting: Internal Medicine

## 2012-01-13 VITALS — BP 124/66 | HR 71 | Temp 98.5°F | Resp 18 | Ht 64.5 in | Wt 178.0 lb

## 2012-01-13 DIAGNOSIS — Z79899 Other long term (current) drug therapy: Secondary | ICD-10-CM

## 2012-01-13 DIAGNOSIS — N200 Calculus of kidney: Secondary | ICD-10-CM

## 2012-01-13 DIAGNOSIS — G43909 Migraine, unspecified, not intractable, without status migrainosus: Secondary | ICD-10-CM | POA: Insufficient documentation

## 2012-01-13 DIAGNOSIS — E039 Hypothyroidism, unspecified: Secondary | ICD-10-CM

## 2012-01-13 DIAGNOSIS — E559 Vitamin D deficiency, unspecified: Secondary | ICD-10-CM

## 2012-01-13 DIAGNOSIS — Z13220 Encounter for screening for lipoid disorders: Secondary | ICD-10-CM

## 2012-01-13 LAB — BASIC METABOLIC PANEL
BUN: 13 mg/dL (ref 6–23)
Calcium: 9.9 mg/dL (ref 8.4–10.5)
Creat: 0.77 mg/dL (ref 0.50–1.10)
Glucose, Bld: 82 mg/dL (ref 70–99)
Sodium: 139 mEq/L (ref 135–145)

## 2012-01-13 MED ORDER — ZOLPIDEM TARTRATE 10 MG PO TABS: ORAL_TABLET | ORAL | Status: DC

## 2012-01-13 NOTE — Assessment & Plan Note (Signed)
Asx. Reminded if has recurrent kidney stones may need to stop topamax. States medication is intended for several month trial only. Obtain chem7

## 2012-01-13 NOTE — Patient Instructions (Signed)
Please schedule fasting labs prior to next visit TSH, free t4-hypothyroidism, lipid-chol screening and vitamin d-vit d deficiency

## 2012-01-13 NOTE — Telephone Encounter (Signed)
Please schedule fasting labs prior to next visit  TSH, free t4-hypothyroidism, lipid-chol screening and vitamin d-vit d deficiency   Patient has upcoming visit at the end of December. She will be going to [facility] lab.

## 2012-01-13 NOTE — Progress Notes (Signed)
  Subjective:    Patient ID: Katie Henry, female    DOB: 16-May-1971, 41 y.o.   MRN: [id number]  HPI Pt is a very pleasant 41 yo female who presents to clinic for follow up of multiple medical problems. H/o hypothyroidism and wt up mildly. Seeing neurology with h/o migraines. Due to recent severe ha with associated vertigo was placed on topamax. Tolerating well without side effects. Remote h/o kidney stones but none recently. Mood is good. Insomnia is stable using lower dose ambien prn and takes vacations from medication sometimes on weekends.  Past Medical History  Diagnosis Date  . Fainting episodes     Dr Morton Peters related  . Migraine   . History of kidney stones 2003    onset with pregnacy   . Hyperthyroidism   . History of UTI   . Dental infection 05/28/2011   Past Surgical History  Procedure Date  . Appendectomy 1991  . Endometrial ablation 01/2007  . Vaginal hysterectomy 2010    ovaries still in place    reports that she has quit smoking. She has never used smokeless tobacco. She reports that she drinks alcohol. She reports that she does not use illicit drugs. family history includes Diabetes in her maternal grandmother; Heart attack in her maternal aunt; Heart disease in her maternal grandfather; Hyperlipidemia in her maternal grandfather, maternal grandmother, and mother; Hypertension in her father and paternal grandfather; and Prostate cancer in an unspecified family member. Allergies  Allergen Reactions  . Amoxicillin   . Codeine   . Ultram (Tramadol Hcl)      Review of Systems see hpi     Objective:   Physical Exam  Physical Exam  Nursing note and vitals reviewed. Constitutional: Appears well-developed and well-nourished. No distress.  HENT:  Head: Normocephalic and atraumatic.  Right Ear: External ear normal.  Left Ear: External ear normal.  Eyes: Conjunctivae are normal. No scleral icterus.  Neck: Neck supple. Carotid bruit is not present.    Cardiovascular: Normal rate, regular rhythm and normal heart sounds.  Exam reveals no gallop and no friction rub.   No murmur heard. Pulmonary/Chest: Effort normal and breath sounds normal. No respiratory distress. He has no wheezes. no rales.  Lymphadenopathy:    He has no cervical adenopathy.  Neurological:Alert.  Skin: Skin is warm and dry. Not diaphoretic.  Psychiatric: Has a normal mood and affect.        Assessment & Plan:

## 2012-01-13 NOTE — Assessment & Plan Note (Signed)
Obtain tsh/free t4 

## 2012-01-14 ENCOUNTER — Other Ambulatory Visit: Payer: Self-pay | Admitting: *Deleted

## 2012-01-14 MED ORDER — LEVOTHYROXINE SODIUM 150 MCG PO TABS: 150.0000 ug | ORAL_TABLET | Freq: Every day | ORAL | Status: DC

## 2012-01-14 NOTE — Telephone Encounter (Signed)
Lab orders entered for December 2013. 

## 2012-01-14 NOTE — Telephone Encounter (Signed)
Patient advised of lab results per Dr Rodena Medin instructions. Refill for synthroid sent to pharmacy.

## 2012-01-21 ENCOUNTER — Encounter: Payer: Self-pay | Admitting: Family Medicine

## 2012-01-21 ENCOUNTER — Ambulatory Visit (INDEPENDENT_AMBULATORY_CARE_PROVIDER_SITE_OTHER): Payer: 59 | Admitting: Family Medicine

## 2012-01-21 VITALS — BP 122/78 | HR 75 | Temp 96.4°F | Wt 180.0 lb

## 2012-01-21 DIAGNOSIS — J069 Acute upper respiratory infection, unspecified: Secondary | ICD-10-CM | POA: Insufficient documentation

## 2012-01-21 LAB — POCT RAPID STREP A (OFFICE): Rapid Strep A Screen: NEGATIVE

## 2012-01-21 MED ORDER — CEPHALEXIN 500 MG PO CAPS: ORAL_CAPSULE | ORAL | Status: DC

## 2012-01-21 NOTE — Progress Notes (Signed)
OFFICE NOTE  01/21/2012  CC: No chief complaint on file.    HPI: Patient is a 41 y.o. African-American female who is here for sore throat. Pt presents complaining of respiratory symptoms for 24 hours.  Primary symptoms are: ST, chest feels congested when she coughs but cough is not that prominent, has to clear her throat some but denies nasal mucous or congestion.  No fever, no HA.  Worst symptoms seems to be the ST/cough.  Lately the symptoms seem to be slightly worsening. Pertinent negatives: No fevers, no wheezing, and no SOB.  No pain in face or teeth.  No significant HA.  Symptoms made worse by nothing.  Symptoms improved by nothing. Smoker? no Recent sick contact? Yes--friend with strep was at her home yesterday. Muscle or joint aches? no  Additional ROS: no n/v/d or abdominal pain.  No rash.  No neck stiffness.   +Mild fatigue.  +Mild appetite loss.   Pertinent PMH:  Past Medical History  Diagnosis Date  . Fainting episodes     Dr Morton Peters related  . Migraine   . History of kidney stones 2003    onset with pregnacy   . Hyperthyroidism   . History of UTI   . Dental infection 05/28/2011    MEDS:  Outpatient Prescriptions Prior to Visit  Medication Sig Dispense Refill  . levothyroxine (SYNTHROID, LEVOTHROID) 150 MCG tablet Take 1 tablet (150 mcg total) by mouth daily.  90 tablet  1  . Probiotic Product (ALIGN) 4 MG CAPS Take 1 capsule by mouth daily.  30 capsule  0  . topiramate (TOPAMAX) 25 MG capsule Take 25 mg by mouth 2 (two) times daily.      . Vitamin D, Ergocalciferol, (DRISDOL) 50000 UNITS CAPS Take 1 capsule (50,000 Units total) by mouth every 7 (seven) days.  4 capsule  2  . zolpidem (AMBIEN) 10 MG tablet Take 1/2 to 1 tablet by mouth at bedtime.  90 tablet  0    PE: Blood pressure 122/78, pulse 75, temperature 96.4 F (35.8 C), weight 180 lb (81.647 kg). VS: noted--normal. Gen: alert, NAD, NONTOXIC APPEARING. HEENT: eyes without injection, drainage, or  swelling.  Ears: EACs clear, TMs with normal light reflex and landmarks.  Nose: No rhinorrhea or turbinate edema or injection. No paranasal sinus TTP.  No facial swelling.  Throat and mouth without focal lesion.  No pharyngial swelling or exudate.  Her soft palate has a deeper pink hue compared to the remainder of her pharynx.   Neck: supple, no LAD.   LUNGS: CTA bilat, nonlabored resps.   CV: RRR, no m/r/g. EXT: no c/c/e SKIN: no rash  LABS: rapid strep NEG  IMPRESSION AND PLAN:  URI (upper respiratory infection) Self-limited nature of this illness was discussed, questions answered.  Discussed symptomatic care, rest, fluids.   Warning signs/symptoms of worsening illness were discussed.  Patient instructed to call or return if any of these occur. Rapid strep neg.  Will send swab for throat culture. Pt going out of town for the w/e.  I gave her a rx for keflex to fill if her strep culture returns positive--I'll give her a call if this occurs.     FOLLOW UP: prn

## 2012-01-21 NOTE — Assessment & Plan Note (Addendum)
Self-limited nature of this illness was discussed, questions answered.  Discussed symptomatic care, rest, fluids.   Warning signs/symptoms of worsening illness were discussed.  Patient instructed to call or return if any of these occur. Rapid strep neg.  Will send swab for throat culture. Pt going out of town for the w/e.  I gave her a rx for keflex to fill if her strep culture returns positive--I'll give her a call if this occurs.

## 2012-05-29 ENCOUNTER — Ambulatory Visit (HOSPITAL_BASED_OUTPATIENT_CLINIC_OR_DEPARTMENT_OTHER)
Admission: RE | Admit: 2012-05-29 | Discharge: 2012-05-29 | Disposition: A | Discharge status: Home / Self Care | Payer: 59 | Source: Ambulatory Visit | Attending: Internal Medicine | Admitting: Internal Medicine

## 2012-05-29 DIAGNOSIS — Z1231 Encounter for screening mammogram for malignant neoplasm of breast: Secondary | ICD-10-CM | POA: Insufficient documentation

## 2012-05-29 DIAGNOSIS — Z1239 Encounter for other screening for malignant neoplasm of breast: Secondary | ICD-10-CM

## 2012-06-01 ENCOUNTER — Encounter: Payer: Self-pay | Admitting: Internal Medicine

## 2012-07-14 ENCOUNTER — Ambulatory Visit: Payer: 59 | Admitting: Internal Medicine

## 2012-07-17 ENCOUNTER — Ambulatory Visit (INDEPENDENT_AMBULATORY_CARE_PROVIDER_SITE_OTHER): Payer: 59 | Admitting: Internal Medicine

## 2012-07-17 ENCOUNTER — Other Ambulatory Visit: Payer: Self-pay | Admitting: *Deleted

## 2012-07-17 ENCOUNTER — Encounter: Payer: Self-pay | Admitting: Internal Medicine

## 2012-07-17 VITALS — BP 116/80 | HR 84 | Temp 98.0°F | Resp 16 | Ht 64.5 in | Wt 173.1 lb

## 2012-07-17 DIAGNOSIS — M79609 Pain in unspecified limb: Secondary | ICD-10-CM

## 2012-07-17 DIAGNOSIS — Z13220 Encounter for screening for lipoid disorders: Secondary | ICD-10-CM

## 2012-07-17 DIAGNOSIS — E559 Vitamin D deficiency, unspecified: Secondary | ICD-10-CM

## 2012-07-17 DIAGNOSIS — E039 Hypothyroidism, unspecified: Secondary | ICD-10-CM

## 2012-07-17 DIAGNOSIS — Z79899 Other long term (current) drug therapy: Secondary | ICD-10-CM

## 2012-07-17 DIAGNOSIS — M79673 Pain in unspecified foot: Secondary | ICD-10-CM | POA: Insufficient documentation

## 2012-07-17 LAB — BASIC METABOLIC PANEL
CO2: 23 mEq/L (ref 19–32)
Calcium: 9.1 mg/dL (ref 8.4–10.5)
Chloride: 106 mEq/L (ref 96–112)
Glucose, Bld: 95 mg/dL (ref 70–99)
Potassium: 4.6 mEq/L (ref 3.5–5.3)
Sodium: 140 mEq/L (ref 135–145)

## 2012-07-17 LAB — T4, FREE: Free T4: 1.1 ng/dL (ref 0.80–1.80)

## 2012-07-17 LAB — LIPID PANEL
HDL: 44 mg/dL (ref >39)
LDL Cholesterol: 112 mg/dL — ABNORMAL HIGH (ref 0–99)

## 2012-07-17 LAB — TSH: TSH: 1.443 u[IU]/mL (ref 0.350–4.500)

## 2012-07-17 MED ORDER — TOPIRAMATE 25 MG PO CPSP: 25.0000 mg | ORAL_CAPSULE | Freq: Two times a day (BID) | ORAL | Status: DC

## 2012-07-17 MED ORDER — ZOLPIDEM TARTRATE 10 MG PO TABS: ORAL_TABLET | ORAL | Status: DC

## 2012-07-17 MED ORDER — VITAMIN D (ERGOCALCIFEROL) 1.25 MG (50000 UNIT) PO CAPS: 50000.0000 [IU] | ORAL_CAPSULE | ORAL | Status: DC

## 2012-07-17 NOTE — Assessment & Plan Note (Signed)
Obtain vit d level 

## 2012-07-17 NOTE — Assessment & Plan Note (Signed)
States will attempt shoe inserts. Consider podiatry referral if sx's persist.

## 2012-07-17 NOTE — Assessment & Plan Note (Signed)
Obtain tsh/free t4 

## 2012-07-17 NOTE — Progress Notes (Signed)
  Subjective:    Patient ID: Katie Henry, female    DOB: 1970/12/31, 41 y.o.   MRN: [id number]  HPI Pt presents to clinic for followup of multiple medical problems. Weight down 5 lbs since last visit. Is attempting to use less ambien and has not required any for the last few days. Does note intermittent left heel pain and right lateral distal MT pain without injury. Wearing running shoes at work helps.  Past Medical History  Diagnosis Date  . Fainting episodes     Dr Morton Peters related  . Migraine   . History of kidney stones 2003    onset with pregnacy   . Hyperthyroidism   . History of UTI   . Dental infection 05/28/2011   Past Surgical History  Procedure Date  . Appendectomy 1991  . Endometrial ablation 01/2007  . Vaginal hysterectomy 2010    ovaries still in place    reports that she has quit smoking. She has never used smokeless tobacco. She reports that she drinks alcohol. She reports that she does not use illicit drugs. family history includes Diabetes in her maternal grandmother; Heart attack in her maternal aunt; Heart disease in her maternal grandfather; Hyperlipidemia in her maternal grandfather, maternal grandmother, and mother; Hypertension in her father and paternal grandfather; and Prostate cancer in an unspecified family member. Allergies  Allergen Reactions  . Amoxicillin   . Codeine   . Ultram (Tramadol Hcl)       Review of Systems see hpi     Objective:   Physical Exam  Physical Exam  Nursing note and vitals reviewed. Constitutional: Appears well-developed and well-nourished. No distress.  HENT:  Head: Normocephalic and atraumatic.  Right Ear: External ear normal.  Left Ear: External ear normal.  Eyes: Conjunctivae are normal. No scleral icterus.  Neck: Neck supple. Carotid bruit is not present.  Cardiovascular: Normal rate, regular rhythm and normal heart sounds.  Exam reveals no gallop and no friction rub.   No murmur heard. Pulmonary/Chest:  Effort normal and breath sounds normal. No respiratory distress. He has no wheezes. no rales.  Lymphadenopathy:    He has no cervical adenopathy.  Neurological:Alert.  Skin: Skin is warm and dry. Not diaphoretic.  Psychiatric: Has a normal mood and affect.        Assessment & Plan:

## 2012-07-17 NOTE — Progress Notes (Signed)
Per patient request, BMET added to orders for kidney function d/t new medication/SLS

## 2012-07-18 ENCOUNTER — Telehealth: Payer: Self-pay | Admitting: *Deleted

## 2012-07-18 DIAGNOSIS — E559 Vitamin D deficiency, unspecified: Secondary | ICD-10-CM

## 2012-07-18 LAB — VITAMIN D 25 HYDROXY (VIT D DEFICIENCY, FRACTURES): Vit D, 25-Hydroxy: 28 ng/mL — ABNORMAL LOW (ref 30–89)

## 2012-07-18 NOTE — Telephone Encounter (Signed)
Message copied by Regis Bill on Tue Jul 18, 2012  4:06 PM ------      Message from: Edwyna Perfect      Created: Tue Jul 18, 2012  1:02 PM       pls notify vit d level low (28). Stop ergocalciferol after finishing current prescription. Begin otc vitamin d 3000 units a day. Recheck vit d level 6 wks after change. Other labs nl

## 2012-07-18 NOTE — Telephone Encounter (Signed)
Pt informed, understood & agreed; future lab order placed/SLS

## 2012-09-08 ENCOUNTER — Other Ambulatory Visit (INDEPENDENT_AMBULATORY_CARE_PROVIDER_SITE_OTHER): Payer: 59

## 2012-09-08 ENCOUNTER — Other Ambulatory Visit: Payer: Self-pay | Admitting: Family Medicine

## 2012-09-08 ENCOUNTER — Telehealth (INDEPENDENT_AMBULATORY_CARE_PROVIDER_SITE_OTHER): Payer: 59 | Admitting: Internal Medicine

## 2012-09-08 DIAGNOSIS — R319 Hematuria, unspecified: Secondary | ICD-10-CM

## 2012-09-08 DIAGNOSIS — N390 Urinary tract infection, site not specified: Secondary | ICD-10-CM

## 2012-09-08 DIAGNOSIS — R350 Frequency of micturition: Secondary | ICD-10-CM

## 2012-09-08 LAB — POCT URINALYSIS DIPSTICK
Bilirubin, UA: NEGATIVE
Glucose, UA: NEGATIVE
Ketones, UA: NEGATIVE
Nitrite, UA: NEGATIVE
pH, UA: 6.5

## 2012-09-08 MED ORDER — NITROFURANTOIN MONOHYD MACRO 100 MG PO CAPS: 100.0000 mg | ORAL_CAPSULE | Freq: Two times a day (BID) | ORAL | Status: DC

## 2012-09-08 NOTE — Telephone Encounter (Signed)
Have printed a copy of Macrobid for her to use over the weekend if her symptoms worsen

## 2012-09-08 NOTE — Progress Notes (Signed)
Patient with urinary frequency, some wbc and rbc on dip will send for culture and given Macrobid to use if symptoms worsen

## 2012-09-08 NOTE — Telephone Encounter (Signed)
RX faxed to [facility].  Pt will fill and use if needed.

## 2012-09-08 NOTE — Telephone Encounter (Signed)
Pt having frequency and hematuria.  Verbal per Dr.Blyth, OK to run POCT urine and culture.

## 2012-09-08 NOTE — Telephone Encounter (Signed)
Patient saw a trace of blood in urine yesterday. Can she have a lab appointment for UA today? She did have her kidney function checked in Dec and it was normal. She is requesting the UA due to her Rx for Topomax.

## 2012-09-10 LAB — URINE CULTURE

## 2012-12-21 ENCOUNTER — Other Ambulatory Visit: Payer: Self-pay | Admitting: Internal Medicine

## 2013-01-09 ENCOUNTER — Encounter: Payer: Self-pay | Admitting: Family

## 2013-01-09 ENCOUNTER — Ambulatory Visit (INDEPENDENT_AMBULATORY_CARE_PROVIDER_SITE_OTHER): Payer: 59 | Admitting: Family

## 2013-01-09 VITALS — BP 122/80 | HR 91 | Temp 98.0°F | Resp 16 | Ht 64.5 in | Wt 176.1 lb

## 2013-01-09 DIAGNOSIS — E039 Hypothyroidism, unspecified: Secondary | ICD-10-CM

## 2013-01-09 DIAGNOSIS — M79609 Pain in unspecified limb: Secondary | ICD-10-CM

## 2013-01-09 DIAGNOSIS — G43909 Migraine, unspecified, not intractable, without status migrainosus: Secondary | ICD-10-CM

## 2013-01-09 DIAGNOSIS — E559 Vitamin D deficiency, unspecified: Secondary | ICD-10-CM

## 2013-01-09 DIAGNOSIS — M79673 Pain in unspecified foot: Secondary | ICD-10-CM

## 2013-01-09 LAB — TSH: TSH: 0.559 u[IU]/mL (ref 0.350–4.500)

## 2013-01-09 NOTE — Progress Notes (Signed)
Subjective:    Patient ID: Katie Henry, female    DOB: [birth year], 42 y.o.   MRN: [id number]  HPI  Katie Henry is a 42 yr old female who presents today for follow up.  1) Hypothyroidism-  The pt is currently maintained on levothyroxine . Reports feeling well on current dose.   2) Migraines- she is currently maintained on topamax. Reports that last month they became more frequent.  She has imitrex that she uses PRN.     3) Vitamin D deficiency- currently on once weekly vitamin D.   4) Heel pain- bilateral heel pain L>R, worsend by standing.    Review of Systems See HPI  Past Medical History  Diagnosis Date  . Fainting episodes     Dr Morton Peters related  . Migraine   . History of kidney stones 2003    onset with pregnacy   . Hyperthyroidism   . History of UTI   . Dental infection 05/28/2011    History   Social History  . Marital Status: Single    Spouse Name: N/A    Number of Children: N/A  . Years of Education: N/A   Occupational History  . Not on file.   Social History Main Topics  . Smoking status: Former Games developer  . Smokeless tobacco: Never Used     Comment: quit smoking 6 years ago  . Alcohol Use: Yes  . Drug Use: No  . Sexually Active: Yes -- Female partner(s)   Other Topics Concern  . Not on file   Social History Narrative   Engaged, two sons.   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   No Tob, occ alcohol, no drugs.    Past Surgical History  Procedure Laterality Date  . Appendectomy  1991  . Endometrial ablation  01/2007  . Vaginal hysterectomy  2010    ovaries still in place    Family History  Problem Relation Age of Onset  . Prostate cancer      maternal and paternal grandparents  . Hyperlipidemia Mother   . Hyperlipidemia Maternal Grandmother   . Hyperlipidemia Maternal Grandfather   . Heart disease Maternal Grandfather   . Heart attack Maternal Aunt     deceased age 59  . Hypertension  Father   . Hypertension Paternal Grandfather   . Diabetes Maternal Grandmother     Allergies  Allergen Reactions  . Amoxicillin   . Codeine   . Ultram (Tramadol Hcl)     Current Outpatient Prescriptions on File Prior to Visit  Medication Sig Dispense Refill  . levothyroxine (SYNTHROID, LEVOTHROID) 150 MCG tablet TAKE 1 TABLET (150 MCG TOTAL) BY MOUTH DAILY.  90 tablet  1  . Magnesium 500 MG TABS Take 500 mg by mouth daily.      . Probiotic Product (ALIGN) 4 MG CAPS Take 1 capsule by mouth daily.  30 capsule  0  . topiramate (TOPAMAX) 25 MG tablet TAKE 1 TABLET (25 MG TOTAL) BY MOUTH 2 (TWO) TIMES DAILY.  60 tablet  3  . Vitamin D, Ergocalciferol, (DRISDOL) 50000 UNITS CAPS Take 1 capsule (50,000 Units total) by mouth every 7 (seven) days.  4 capsule  2  . zolpidem (AMBIEN) 10 MG tablet Take 1/2 to 1 tablet by mouth at bedtime.  90 tablet  0   No current facility-administered medications on file prior to visit.    BP 122/80  Pulse 91  Temp(Src) 98 F (36.7 C) (Oral)  Resp 16  Ht 5' 4.5" (1.638 m)  Wt 176 lb 1.3 oz (79.869 kg)  BMI 29.77 kg/m2  SpO2 99%       Objective:   Physical Exam        Assessment & Plan:

## 2013-01-09 NOTE — Assessment & Plan Note (Signed)
Clinically stable on synthroid, obtain tsh, continue same.

## 2013-01-09 NOTE — Patient Instructions (Addendum)
You can try aleve 220mg  twice daily for [redacted] week along with daily exercises (see handout) and icing for heel pain. Call if symptoms worsen or if not improved in 2 weeks. Please complete lab work prior to leaving.  Follow up in 6 months.

## 2013-01-09 NOTE — Assessment & Plan Note (Signed)
Stable on topamax and prn imitrex.  

## 2013-01-09 NOTE — Assessment & Plan Note (Signed)
Check, level.  Hopefully she can transition to an otc vitamin D if stable.

## 2013-01-09 NOTE — Assessment & Plan Note (Signed)
Likely plantar fasciitis.  Recommended 1 week course of aleve, ice bid, exercises provided.

## 2013-01-10 ENCOUNTER — Other Ambulatory Visit: Payer: Self-pay | Admitting: Family

## 2013-01-10 ENCOUNTER — Encounter: Payer: Self-pay | Admitting: Family

## 2013-01-10 DIAGNOSIS — E039 Hypothyroidism, unspecified: Secondary | ICD-10-CM

## 2013-01-10 LAB — VITAMIN D 25 HYDROXY (VIT D DEFICIENCY, FRACTURES): Vit D, 25-Hydroxy: 40 ng/mL (ref 30–89)

## 2013-01-10 MED ORDER — VITAMIN D 1000 UNITS PO CAPS: 3000.0000 [IU] | ORAL_CAPSULE | Freq: Every day | ORAL | Status: DC

## 2013-03-28 ENCOUNTER — Other Ambulatory Visit (INDEPENDENT_AMBULATORY_CARE_PROVIDER_SITE_OTHER): Payer: 59

## 2013-03-28 DIAGNOSIS — E039 Hypothyroidism, unspecified: Secondary | ICD-10-CM

## 2013-03-28 NOTE — Progress Notes (Signed)
Labs only

## 2013-03-29 ENCOUNTER — Ambulatory Visit (INDEPENDENT_AMBULATORY_CARE_PROVIDER_SITE_OTHER): Payer: 59 | Admitting: Neurology

## 2013-03-29 ENCOUNTER — Encounter: Payer: Self-pay | Admitting: Neurology

## 2013-03-29 ENCOUNTER — Telehealth: Payer: Self-pay | Admitting: Family

## 2013-03-29 VITALS — BP 123/84 | HR 78 | Ht 65.0 in | Wt 179.0 lb

## 2013-03-29 DIAGNOSIS — R55 Syncope and collapse: Secondary | ICD-10-CM | POA: Insufficient documentation

## 2013-03-29 DIAGNOSIS — E0500 Thyrotoxicosis with diffuse goiter without thyrotoxic crisis or storm: Secondary | ICD-10-CM | POA: Insufficient documentation

## 2013-03-29 DIAGNOSIS — G43909 Migraine, unspecified, not intractable, without status migrainosus: Secondary | ICD-10-CM

## 2013-03-29 DIAGNOSIS — K589 Irritable bowel syndrome without diarrhea: Secondary | ICD-10-CM

## 2013-03-29 DIAGNOSIS — N809 Endometriosis, unspecified: Secondary | ICD-10-CM

## 2013-03-29 DIAGNOSIS — E039 Hypothyroidism, unspecified: Secondary | ICD-10-CM

## 2013-03-29 MED ORDER — TOPIRAMATE 50 MG PO TABS: 50.0000 mg | ORAL_TABLET | Freq: Two times a day (BID) | ORAL | Status: DC

## 2013-03-29 MED ORDER — LEVOTHYROXINE SODIUM 125 MCG PO TABS: ORAL_TABLET | ORAL | Status: DC

## 2013-03-29 NOTE — Telephone Encounter (Signed)
pls call pt and let her know that her thyroid medication appears a bit too strong for her.  I would like her to decrease her dose.  She should take 125 mcg tabs on tuesdays and thursdays and 150mg  all other days.  Repeat TSH in 6 weeks, dx hypothyroid.

## 2013-03-29 NOTE — Progress Notes (Signed)
[facility]  PATIENT: Katie Henry DOB: [birth year]  HISTORICAL  Katie Henry is a 42 years old right-handed Caucasian female, was previously patient of Dr. Sandria Manly, she has been followed up for migraine headaches  She reported a history of migraine headaches since high school, usually clustered around her menstruation period of time, but has improved since her first childbirth at age 15.    Both her father and mother suffered migraine.  Since age 35, she began to suffer frequent migraine headaches again, her typical migraine are lateralized retro-orbital area severe pounding headache, usually on the left side, with associated light noise sensitivity, movements make it worse, lasting 4-6 hours, relieved by sleeping in dark quiet room, ibuprofen, occasionally Imitrex as needed was helpful.  Trigger for her migraines are artificial sweetener, nuts, canned food, weather change, exertion, sleep deprivation, she had history of endometriosis, had total hysterectomy  She was also admitted to [facility] in November 2011 for passing out episode, extensive evaluation including MRI of the brain, MRA of the brain, CAT scan of the brain, EKG, carotid Doppler study, echocardiogram, laboratory evaluation including CPK, serum cortisol, CBC, CMP, drug screen, thyroid function tests, EEG was all normal.  She no longer has dizziness or passing out episode, overall her migraine headaches are under satisfactory control, she only has migrated 2-4 times each month, relieved by couple tablets of ibuprofen, still work full time as phelbotomist  REVIEW OF SYSTEMS: Full 14 system review of systems performed and notable only for headaches ALLERGIES: Allergies  Allergen Reactions  . Amoxicillin   . Codeine   . Ultram [Tramadol Hcl]     HOME MEDICATIONS: Outpatient Prescriptions Prior to Visit  Medication Sig Dispense Refill  . Cholecalciferol (VITAMIN D) 1000 UNITS capsule Take 3  capsules (3,000 Units total) by mouth daily.      . Magnesium 500 MG TABS Take 500 mg by mouth daily.      . Probiotic Product (ALIGN) 4 MG CAPS Take 1 capsule by mouth daily.  30 capsule  0  . topiramate (TOPAMAX) 25 MG tablet TAKE 1 TABLET (25 MG TOTAL) BY MOUTH 2 (TWO) TIMES DAILY.  60 tablet  3  . zolpidem (AMBIEN) 10 MG tablet Take 1/2 to 1 tablet by mouth at bedtime.  90 tablet  0  . levothyroxine (SYNTHROID, LEVOTHROID) 150 MCG tablet TAKE 1 TABLET (150 MCG TOTAL) BY MOUTH DAILY.  90 tablet  1   No facility-administered medications prior to visit.    PAST MEDICAL HISTORY: Past Medical History  Diagnosis Date  . Fainting episodes     Dr Morton Peters related  . Migraine   . History of kidney stones 2003    onset with pregnacy   . Hyperthyroidism   . History of UTI   . Dental infection 05/28/2011  . Graves disease   . Endometriosis   . IBS (irritable bowel syndrome)   . Vitamin D deficiency   . Syncope     PAST SURGICAL HISTORY: Past Surgical History  Procedure Laterality Date  . Appendectomy  1991  . Endometrial ablation  01/2007  . Vaginal hysterectomy  2010    ovaries still in place    FAMILY HISTORY: Family History  Problem Relation Age of Onset  . Prostate cancer      maternal and paternal grandparents  . Hyperlipidemia Mother   . Hyperlipidemia Maternal Grandmother   . Diabetes Maternal Grandmother   . Hyperlipidemia Maternal Grandfather   . Heart disease  Maternal Grandfather   . Heart attack Maternal Aunt     deceased age 60  . Hypertension Father   . Hypertension Paternal Grandfather     SOCIAL HISTORY:  History   Social History  . Marital Status: Single    Spouse Name: N/A    Number of Children: 2  . Years of Education: 13   Occupational History  .  [employer]   Social History Main Topics  . Smoking status: Former Smoker    Types: Cigarettes  . Smokeless tobacco: Never Used     Comment: quit one year ago.  . Alcohol Use: 0.6 oz/week      1 Glasses of wine per week  . Drug Use: No  . Sexual Activity: Yes    Partners: Male   Other Topics Concern  . Not on file   Social History Narrative   Engaged, two sons.   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   No Tob, occ alcohol, no drugs.     PHYSICAL EXAM  Filed Vitals:   03/29/13 0812  BP: 123/84  Pulse: 78  Height: 5\' 5"  (1.651 m)  Weight: 179 lb (81.194 kg)   Body mass index is 29.79 kg/(m^2).   Generalized: In no acute distress  Neck: Supple, no carotid bruits   Cardiac: Regular rate rhythm  Pulmonary: Clear to auscultation bilaterally  Musculoskeletal: No deformity  Neurological examination  Mentation: Alert oriented to time, place, history taking, and causual conversation  Cranial nerve II-XII: Pupils were equal round reactive to light extraocular movements were full, visual field were full on confrontational test. facial sensation and strength were normal. hearing was intact to finger rubbing bilaterally. Uvula tongue midline.  head turning and shoulder shrug and were normal and symmetric.Tongue protrusion into cheek strength was normal.  Motor: normal tone, bulk and strength.  Sensory: Intact to fine touch, pinprick, preserved vibratory sensation, and proprioception at toes.  Coordination: Normal finger to nose, heel-to-shin bilaterally there was no truncal ataxia  Gait: Rising up from seated position without assistance, normal stance, without trunk ataxia, moderate stride, good arm swing, smooth turning, able to perform tiptoe, and heel walking without difficulty.   Romberg signs: Negative  Deep tendon reflexes: Brachioradialis 2/2, biceps 2/2, triceps 2/2, patellar 2/2, Achilles 2/2, plantar responses were flexor bilaterally.   DIAGNOSTIC DATA (LABS, IMAGING, TESTING) - I reviewed patient records, labs, notes, testing and imaging myself where available.  Lab Results  Component Value Date   WBC  12.8* 10/09/2008   HGB 10.3 DELTA CHECK NOTED* 10/09/2008   HCT 30.0* 10/09/2008   MCV 95.0 10/09/2008   PLT 272 DELTA CHECK NOTED 10/09/2008      Component Value Date/Time   NA 140 07/17/2012 1049   K 4.6 07/17/2012 1049   CL 106 07/17/2012 1049   CO2 23 07/17/2012 1049   GLUCOSE 95 07/17/2012 1049   BUN 8 07/17/2012 1049   CREATININE 0.72 07/17/2012 1049   CREATININE 0.69 05/16/2008 1850   CALCIUM 9.1 07/17/2012 1049   PROT 6.7 05/16/2008 1850   ALBUMIN 3.4* 05/16/2008 1850   AST 15 05/16/2008 1850   ALT 14 05/16/2008 1850   ALKPHOS 60 05/16/2008 1850   BILITOT 0.6 05/16/2008 1850   GFRNONAA >60 05/16/2008 1850   GFRAA  Value: >60        The eGFR has been calculated using the MDRD equation. This calculation has not been validated in all clinical 05/16/2008 1850  Lab Results  Component Value Date   CHOL 177 07/17/2012   HDL 44 07/17/2012   LDLCALC 112* 07/17/2012   TRIG 107 07/17/2012   CHOLHDL 4.0 07/17/2012   No results found for this basename: HGBA1C   No results found for this basename: VITAMINB12   Lab Results  Component Value Date   TSH 0.234* 03/28/2013   ASSESSMENT AND PLAN  42 years old right-handed Caucasian female, with a long-standing history of migraines, overall doing well,  1 increase her preventive medications Topamax from 25-50 mg twice a day 2 continue ibuprofen, and Imitrex as needed for abortive treatment 3 return to clinic in  one year as needed     Levert Feinstein, M.D. Ph.D.  [facility] [address], [address] [place], [place] [address] [phone]

## 2013-03-29 NOTE — Telephone Encounter (Signed)
Left detailed message on cell# and to call and let us know if we need to send strength to pharmacy. Lab order entered.

## 2013-05-08 ENCOUNTER — Other Ambulatory Visit (INDEPENDENT_AMBULATORY_CARE_PROVIDER_SITE_OTHER): Payer: 59

## 2013-05-08 ENCOUNTER — Encounter: Payer: Self-pay | Admitting: Family

## 2013-05-08 DIAGNOSIS — Z5181 Encounter for therapeutic drug level monitoring: Secondary | ICD-10-CM

## 2013-05-08 DIAGNOSIS — E039 Hypothyroidism, unspecified: Secondary | ICD-10-CM

## 2013-05-08 LAB — TSH: TSH: 0.06 u[IU]/mL — ABNORMAL LOW (ref 0.35–5.50)

## 2013-05-08 NOTE — Telephone Encounter (Signed)
Could you please leave order for bmet in lab dx therapeutic drug monitoring. Thanks

## 2013-05-09 ENCOUNTER — Encounter: Payer: Self-pay | Admitting: Family

## 2013-05-09 MED ORDER — LEVOTHYROXINE SODIUM 125 MCG PO TABS: 125.0000 ug | ORAL_TABLET | Freq: Every day | ORAL | Status: DC

## 2013-05-09 NOTE — Telephone Encounter (Signed)
Thyroid testing shows that her thyroid medication is still too strong. Decrease TSH to 125 mcg every day. Repeat TSH in 6 weeks.  I do not see a bmet.  Can elam add it on?

## 2013-05-09 NOTE — Telephone Encounter (Signed)
Order entered

## 2013-05-10 ENCOUNTER — Ambulatory Visit: Payer: 59

## 2013-05-10 DIAGNOSIS — Z5181 Encounter for therapeutic drug level monitoring: Secondary | ICD-10-CM

## 2013-05-10 LAB — BASIC METABOLIC PANEL
CO2: 26 mEq/L (ref 19–32)
Calcium: 10.1 mg/dL (ref 8.4–10.5)
Creatinine, Ser: 0.7 mg/dL (ref 0.4–1.2)
GFR: 119.79 mL/min (ref >60.00)
Glucose, Bld: 89 mg/dL (ref 70–99)
Sodium: 141 mEq/L (ref 135–145)

## 2013-05-11 ENCOUNTER — Encounter: Payer: Self-pay | Admitting: Family

## 2013-05-14 ENCOUNTER — Telehealth: Payer: Self-pay | Admitting: Family

## 2013-05-14 MED ORDER — LEVOTHYROXINE SODIUM 100 MCG PO TABS: 100.0000 ug | ORAL_TABLET | Freq: Every day | ORAL | Status: DC

## 2013-05-14 NOTE — Telephone Encounter (Signed)
See phone note

## 2013-05-14 NOTE — Telephone Encounter (Signed)
Please call pt and let her know that her thyroid test shows that her thyroid medication should be decreased. Change synthroid to once daily. Repeat TSH in 6 weeks, dx hypothyroid.

## 2013-05-14 NOTE — Telephone Encounter (Signed)
Left detailed message on cell# and to call and let us know where she wants to complete her lab work in 6 weeks.

## 2013-05-24 ENCOUNTER — Other Ambulatory Visit: Payer: Self-pay

## 2013-06-25 ENCOUNTER — Other Ambulatory Visit: Payer: Self-pay | Admitting: Family

## 2013-06-26 NOTE — Telephone Encounter (Signed)
Rx was sent to [facility] on 03/29/13, #60 x 12 refills. Left message on [facility] pharmacy voicemail to see if they can transfer rx from [facility] or call us tomorrow if new Rx is needed.

## 2013-06-29 ENCOUNTER — Other Ambulatory Visit: Payer: Self-pay | Admitting: Family Medicine

## 2013-06-29 ENCOUNTER — Encounter: Payer: Self-pay | Admitting: Family Medicine

## 2013-06-29 DIAGNOSIS — Z139 Encounter for screening, unspecified: Secondary | ICD-10-CM

## 2013-06-29 DIAGNOSIS — E039 Hypothyroidism, unspecified: Secondary | ICD-10-CM

## 2013-06-29 NOTE — Telephone Encounter (Signed)
I believe this is y'alls pt?

## 2013-07-09 ENCOUNTER — Ambulatory Visit (HOSPITAL_COMMUNITY)
Admission: RE | Admit: 2013-07-09 | Discharge: 2013-07-09 | Disposition: A | Discharge status: Home / Self Care | Payer: 59 | Source: Ambulatory Visit | Attending: Family Medicine | Admitting: Family Medicine

## 2013-07-09 DIAGNOSIS — Z1231 Encounter for screening mammogram for malignant neoplasm of breast: Secondary | ICD-10-CM | POA: Insufficient documentation

## 2013-07-09 DIAGNOSIS — Z139 Encounter for screening, unspecified: Secondary | ICD-10-CM

## 2013-07-22 LAB — T4, FREE: Free T4: 0.79 ng/dL — ABNORMAL LOW (ref 0.80–1.80)

## 2013-07-22 LAB — TSH: TSH: 46.91 u[IU]/mL — ABNORMAL HIGH (ref 0.350–4.500)

## 2013-07-24 NOTE — Telephone Encounter (Signed)
Please advise mychart question?

## 2013-07-26 MED ORDER — LEVOTHYROXINE SODIUM 150 MCG PO TABS: 150.0000 ug | ORAL_TABLET | Freq: Every day | ORAL | Status: DC

## 2013-07-26 NOTE — Addendum Note (Signed)
Addended by: Varney Daily on: 07/26/2013 01:10 PM   Modules accepted: Orders

## 2013-10-03 ENCOUNTER — Encounter: Payer: Self-pay | Admitting: Family Medicine

## 2013-10-03 DIAGNOSIS — E039 Hypothyroidism, unspecified: Secondary | ICD-10-CM

## 2013-10-03 DIAGNOSIS — Z0000 Encounter for general adult medical examination without abnormal findings: Secondary | ICD-10-CM

## 2013-10-05 NOTE — Telephone Encounter (Signed)
Lab order faxed.

## 2013-10-06 LAB — RENAL FUNCTION PANEL
Albumin: 3.9 g/dL (ref 3.5–5.2)
BUN: 11 mg/dL (ref 6–23)
CALCIUM: 8.9 mg/dL (ref 8.4–10.5)
CHLORIDE: 110 meq/L (ref 96–112)
CO2: 22 meq/L (ref 19–32)
Creat: 0.72 mg/dL (ref 0.50–1.10)
GLUCOSE: 91 mg/dL (ref 70–99)
POTASSIUM: 4.5 meq/L (ref 3.5–5.3)
Phosphorus: 2.6 mg/dL (ref 2.3–4.6)
Sodium: 136 mEq/L (ref 135–145)

## 2013-10-06 LAB — CBC
HEMATOCRIT: 39.8 % (ref 36.0–46.0)
HEMOGLOBIN: 13.5 g/dL (ref 12.0–15.0)
MCH: 30.8 pg (ref 26.0–34.0)
MCHC: 33.9 g/dL (ref 30.0–36.0)
MCV: 90.7 fL (ref 78.0–100.0)
Platelets: 387 10*3/uL (ref 150–400)
RBC: 4.39 MIL/uL (ref 3.87–5.11)
RDW: 12.7 % (ref 11.5–15.5)
WBC: 7.1 10*3/uL (ref 4.0–10.5)

## 2013-10-06 LAB — HEPATIC FUNCTION PANEL
ALBUMIN: 3.9 g/dL (ref 3.5–5.2)
ALT: 13 U/L (ref 0–35)
AST: 10 U/L (ref 0–37)
Alkaline Phosphatase: 48 U/L (ref 39–117)
Bilirubin, Direct: 0.1 mg/dL (ref 0.0–0.3)
Indirect Bilirubin: 0.3 mg/dL (ref 0.2–1.2)
TOTAL PROTEIN: 6.6 g/dL (ref 6.0–8.3)
Total Bilirubin: 0.4 mg/dL (ref 0.2–1.2)

## 2013-10-06 LAB — LIPID PANEL
CHOL/HDL RATIO: 3.1 ratio
CHOLESTEROL: 164 mg/dL (ref 0–200)
HDL: 53 mg/dL (ref >39)
LDL Cholesterol: 91 mg/dL (ref 0–99)
Triglycerides: 102 mg/dL (ref <150)
VLDL: 20 mg/dL (ref 0–40)

## 2013-10-06 LAB — T4, FREE: FREE T4: 0.89 ng/dL (ref 0.80–1.80)

## 2013-10-06 LAB — TSH: TSH: 4.662 u[IU]/mL — ABNORMAL HIGH (ref 0.350–4.500)

## 2013-10-09 ENCOUNTER — Ambulatory Visit (INDEPENDENT_AMBULATORY_CARE_PROVIDER_SITE_OTHER): Payer: 59 | Admitting: Family Medicine

## 2013-10-09 ENCOUNTER — Encounter: Payer: Self-pay | Admitting: Family Medicine

## 2013-10-09 VITALS — BP 120/74 | HR 75 | Temp 98.2°F | Ht 64.5 in | Wt 181.0 lb

## 2013-10-09 DIAGNOSIS — G43909 Migraine, unspecified, not intractable, without status migrainosus: Secondary | ICD-10-CM

## 2013-10-09 DIAGNOSIS — E039 Hypothyroidism, unspecified: Secondary | ICD-10-CM

## 2013-10-09 DIAGNOSIS — Z0000 Encounter for general adult medical examination without abnormal findings: Secondary | ICD-10-CM

## 2013-10-09 DIAGNOSIS — K589 Irritable bowel syndrome without diarrhea: Secondary | ICD-10-CM

## 2013-10-09 NOTE — Progress Notes (Signed)
Patient ID: Katie Henry, female   DOB: 09-10-1970, 43 y.o.   MRN: [id number] Katie Henry [id number] 19-Oct-1970 10/09/2013      Progress Note-Follow Up  Subjective  Chief Complaint  Chief Complaint  Patient presents with  . Annual Exam    Pt had mamogranm in 2014 and last pap was on 2010. no record of ECG. Pt complained about HA since last Saturday.    HPI  Patient is a 43 year old female in today for routine medical care. She is generally doing well. Recent adjustments to her levothyroxine has been helpful. But she did note on the increased dose every day she was struggling with some increased sluggishness. She is presently taking 150 mcg tablets daily except for twice a week she takes a half a tab. She happens to have a headache today which she has had for several days but in general is having less headaches than usual. Since starting Topamax they have decreased in intensity and frequency. No recent illness. IBS is improved with probiotic yogurt. Denies CP/palp/SOB/HA/congestion/fevers/GI or GU c/o. Taking meds as prescribed  Past Medical History  Diagnosis Date  . 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 Migraine   . History of kidney stones 2003    onset with pregnacy   . Hyperthyroidism   . History of UTI   . Dental infection 05/28/2011  . Graves disease   . Endometriosis   . IBS (irritable bowel syndrome)   . Vitamin D deficiency   . Syncope     Past Surgical History  Procedure Laterality Date  . Appendectomy  1991  . Endometrial ablation  01/2007  . Vaginal hysterectomy  2010    ovaries still in place    Family History  Problem Relation Age of Onset  . Prostate cancer      maternal and paternal grandparents  . Hyperlipidemia Mother   . Hyperlipidemia Maternal Grandmother   . Diabetes Maternal Grandmother   . Kidney disease Maternal Grandmother   . Hyperlipidemia Maternal Grandfather   . Heart disease Maternal Grandfather 35    MI  . Prostate  cancer Maternal Grandfather   . Heart attack Maternal Aunt     deceased age 17  . Hypertension Father   . Dementia Father   . Seizures Father     s/p TBI  . Hypertension Paternal Grandfather   . Graves' disease Sister   . Obesity Brother     History   Social History  . Marital Status: Single    Spouse Name: N/A    Number of Children: 2  . Years of Education: 13   Occupational History  .  [place]   Social History Main Topics  . Smoking status: Former Smoker    Types: Cigarettes  . Smokeless tobacco: Never Used     Comment: quit one year ago.  . Alcohol Use: 0.6 oz/week    1 Glasses of wine per week  . Drug Use: No  . Sexual Activity: Yes    Partners: Male     Comment: work at [employer], lives with fiance and son   Other Topics Concern  . Not on file   Social History Narrative   Engaged, two sons.   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   No Tob, occ alcohol, no drugs.    Current Outpatient Prescriptions on File Prior to Visit  Medication Sig Dispense Refill  .  Cholecalciferol (VITAMIN D) 1000 UNITS capsule Take 3 capsules (3,000 Units total) by mouth daily.      Marland Kitchen levothyroxine (SYNTHROID, LEVOTHROID) 150 MCG tablet Take 1 tablet (150 mcg total) by mouth daily. Must dispense Synthroid  90 tablet  0  . Magnesium 500 MG TABS Take 500 mg by mouth daily.      . Probiotic Product (ALIGN) 4 MG CAPS Take 1 capsule by mouth daily.  30 capsule  0  . topiramate (TOPAMAX) 50 MG tablet Take 1 tablet (50 mg total) by mouth 2 (two) times daily.  60 tablet  12  . levothyroxine (SYNTHROID, LEVOTHROID) 100 MCG tablet Take 1 tablet (100 mcg total) by mouth daily.  30 tablet  2   No current facility-administered medications on file prior to visit.    Allergies  Allergen Reactions  . Amoxicillin   . Codeine   . Ultram [Tramadol Hcl]     Review of Systems  Review of Systems  Constitutional: Negative for fever, chills and  malaise/fatigue.  HENT: Negative for congestion, hearing loss and nosebleeds.   Eyes: Negative for discharge.  Respiratory: Negative for cough, sputum production, shortness of breath and wheezing.   Cardiovascular: Negative for chest pain, palpitations and leg swelling.  Gastrointestinal: Negative for heartburn, nausea, vomiting, abdominal pain, diarrhea, constipation and blood in stool.  Genitourinary: Negative for dysuria, urgency, frequency and hematuria.  Musculoskeletal: Negative for back pain, falls and myalgias.  Skin: Negative for rash.  Neurological: Negative for dizziness, tremors, sensory change, focal weakness, loss of consciousness, weakness and headaches.  Endo/Heme/Allergies: Negative for polydipsia. Does not bruise/bleed easily.  Psychiatric/Behavioral: Negative for depression and suicidal ideas. The patient is not nervous/anxious and does not have insomnia.     Objective  BP 120/74  Pulse 75  Temp(Src) 98.2 F (36.8 C) (Oral)  Ht 5' 4.5" (1.638 m)  Wt 181 lb (82.101 kg)  BMI 30.60 kg/m2  SpO2 99%  Physical Exam  Physical Exam  Constitutional: She is well-developed, well-nourished, and in no distress. No distress.  HENT:  Left Ear: External ear normal.  Mouth/Throat: No oropharyngeal exudate.  Eyes: EOM are normal. Left eye exhibits no discharge. No scleral icterus.  Neck: No JVD present. Tracheal deviation present.  Cardiovascular: Normal heart sounds and intact distal pulses.   Pulmonary/Chest: She is in respiratory distress. She has no rales.  Abdominal: She exhibits no distension and no mass. There is tenderness. There is guarding.  Musculoskeletal: She exhibits edema. She exhibits no tenderness.  Lymphadenopathy:    She has no cervical adenopathy.  Skin: No rash noted. No erythema.    Lab Results  Component Value Date   TSH 4.662* 10/05/2013   Lab Results  Component Value Date   WBC 7.1 10/05/2013   HGB 13.5 10/05/2013   HCT 39.8 10/05/2013   MCV  90.7 10/05/2013   PLT 387 10/05/2013   Lab Results  Component Value Date   CREATININE 0.72 10/05/2013   BUN 11 10/05/2013   NA 136 10/05/2013   K 4.5 10/05/2013   CL 110 10/05/2013   CO2 22 10/05/2013   Lab Results  Component Value Date   ALT 13 10/05/2013   AST 10 10/05/2013   ALKPHOS 48 10/05/2013   BILITOT 0.4 10/05/2013   Lab Results  Component Value Date   CHOL 164 10/05/2013   Lab Results  Component Value Date   HDL 53 10/05/2013   Lab Results  Component Value Date   LDLCALC 91 10/05/2013  Lab Results  Component Value Date   TRIG 102 10/05/2013   Lab Results  Component Value Date   CHOLHDL 3.1 10/05/2013     Assessment & Plan  IBS (irritable bowel syndrome) Improved some with Activia yogurt, encouraged probiotic caps daily as well  Hypothyroidism On Levothyroxine, continue to monitor. Recent adjustments have been helpful.  Preventative health care Patient encouraged to maintain heart healthy diet, regular exercise, adequate sleep. Consider daily probiotics. Take medications as prescribed. Reviewed annual labs, encouraged 3D MGM  Migraines Improved with Topamax. Encouraged increased hydration, 64 ounces of clear fluids daily. Minimize alcohol and caffeine. Eat small frequent meals with lean proteins and complex carbs. Avoid high and low blood sugars. Get adequate sleep, 7-8 hours a night. Needs exercise daily preferably in the morning.

## 2013-10-09 NOTE — Patient Instructions (Signed)
Premium or Premier orthotics on [employer] Pas cream Call if you would like an appt with Dr Tamala Julian Plantar Fasciitis Plantar fasciitis is a common condition that causes foot pain. It is soreness (inflammation) of the band of tough fibrous tissue on the bottom of the foot that runs from the heel bone (calcaneus) to the ball of the foot. The cause of this soreness may be from excessive standing, poor fitting shoes, running on hard surfaces, being overweight, having an abnormal walk, or overuse (this is common in runners) of the painful foot or feet. It is also common in aerobic exercise dancers and ballet dancers. SYMPTOMS  Most people with plantar fasciitis complain of:  Severe pain in the morning on the bottom of their foot especially when taking the first steps out of bed. This pain recedes after a few minutes of walking.  Severe pain is experienced also during walking following a long period of inactivity.  Pain is worse when walking barefoot or up stairs DIAGNOSIS   Your caregiver will diagnose this condition by examining and feeling your foot.  Special tests such as X-rays of your foot, are usually not needed. PREVENTION   Consult a sports medicine professional before beginning a new exercise program.  Walking programs offer a good workout. With walking there is a lower chance of overuse injuries common to runners. There is less impact and less jarring of the joints.  Begin all new exercise programs slowly. If problems or pain develop, decrease the amount of time or distance until you are at a comfortable level.  Wear good shoes and replace them regularly.  Stretch your foot and the heel cords at the back of the ankle (Achilles tendon) both before and after exercise.  Run or exercise on even surfaces that are not hard. For example, asphalt is better than pavement.  Do not run barefoot on hard surfaces.  If using a treadmill, vary the incline.  Do not continue to workout if you  have foot or joint problems. Seek professional help if they do not improve. HOME CARE INSTRUCTIONS   Avoid activities that cause you pain until you recover.  Use ice or cold packs on the problem or painful areas after working out.  Only take over-the-counter or prescription medicines for pain, discomfort, or fever as directed by your caregiver.  Soft shoe inserts or athletic shoes with air or gel sole cushions may be helpful.  If problems continue or become more severe, consult a sports medicine caregiver or your own health care provider. Cortisone is a potent anti-inflammatory medication that may be injected into the painful area. You can discuss this treatment with your caregiver. MAKE SURE YOU:   Understand these instructions.  Will watch your condition.  Will get help right away if you are not doing well or get worse. Document Released: 03/30/2001 Document Revised: 09/27/2011 Document Reviewed: 05/29/2008 [facility] Patient Information 2014 [place], [place].

## 2013-10-14 DIAGNOSIS — Z0000 Encounter for general adult medical examination without abnormal findings: Secondary | ICD-10-CM | POA: Insufficient documentation

## 2013-10-14 MED ORDER — LEVOTHYROXINE SODIUM 150 MCG PO TABS: ORAL_TABLET | ORAL | Status: DC

## 2013-10-14 NOTE — Assessment & Plan Note (Signed)
On Levothyroxine, continue to monitor. Recent adjustments have been helpful.

## 2013-10-14 NOTE — Assessment & Plan Note (Signed)
Patient encouraged to maintain heart healthy diet, regular exercise, adequate sleep. Consider daily probiotics. Take medications as prescribed. Reviewed annual labs, encouraged 3D MGM

## 2013-10-14 NOTE — Assessment & Plan Note (Signed)
Improved some with Activia yogurt, encouraged probiotic caps daily as well

## 2013-10-14 NOTE — Assessment & Plan Note (Signed)
Improved with Topamax. Encouraged increased hydration, 64 ounces of clear fluids daily. Minimize alcohol and caffeine. Eat small frequent meals with lean proteins and complex carbs. Avoid high and low blood sugars. Get adequate sleep, 7-8 hours a night. Needs exercise daily preferably in the morning.

## 2013-10-22 ENCOUNTER — Encounter: Payer: Self-pay | Admitting: Family Medicine

## 2014-01-10 ENCOUNTER — Encounter: Payer: Self-pay | Admitting: Family Medicine

## 2014-01-11 ENCOUNTER — Other Ambulatory Visit: Payer: Self-pay | Admitting: Family Medicine

## 2014-01-11 DIAGNOSIS — R110 Nausea: Secondary | ICD-10-CM

## 2014-01-11 MED ORDER — SCOPOLAMINE 1 MG/3DAYS TD PT72
1.0000 | MEDICATED_PATCH | TRANSDERMAL | Status: DC
Start: 2014-01-11 — End: 2015-04-16

## 2014-01-15 ENCOUNTER — Telehealth: Payer: Self-pay | Admitting: Family Medicine

## 2014-01-15 MED ORDER — IBUPROFEN 400 MG PO TABS: 400.0000 mg | ORAL_TABLET | Freq: Three times a day (TID) | ORAL | Status: DC | PRN

## 2014-01-15 NOTE — Telephone Encounter (Signed)
I do not see an old script, my favorite dose is Ibuprofen 400 mg tabs 1 tab po tid prn pain with food, disp #90 with 2 rf. If that is OK with her

## 2014-01-15 NOTE — Telephone Encounter (Signed)
Refill- ibuprofen  [facility] [facility]

## 2014-01-15 NOTE — Telephone Encounter (Signed)
Please advise 

## 2014-04-02 ENCOUNTER — Telehealth: Payer: Self-pay | Admitting: Family Medicine

## 2014-04-02 DIAGNOSIS — N200 Calculus of kidney: Secondary | ICD-10-CM

## 2014-04-02 DIAGNOSIS — E559 Vitamin D deficiency, unspecified: Secondary | ICD-10-CM

## 2014-04-02 DIAGNOSIS — E039 Hypothyroidism, unspecified: Secondary | ICD-10-CM

## 2014-04-02 NOTE — Telephone Encounter (Signed)
Please advise labs and diagnosis

## 2014-04-02 NOTE — Telephone Encounter (Signed)
Check tsh and free T4 for hypothyroid, vit d for deficiency and renal panel for kidney stones at [facility]

## 2014-04-02 NOTE — Telephone Encounter (Signed)
Caller name: Gagandeep Relation to pt: self  Call back number: [phone]  Reason for call:   pt requesting lab orders for [facility]

## 2014-04-03 NOTE — Telephone Encounter (Signed)
Left a message for pt that labs were ordered

## 2014-04-09 ENCOUNTER — Other Ambulatory Visit (INDEPENDENT_AMBULATORY_CARE_PROVIDER_SITE_OTHER): Payer: 59

## 2014-04-09 DIAGNOSIS — E039 Hypothyroidism, unspecified: Secondary | ICD-10-CM

## 2014-04-09 DIAGNOSIS — N200 Calculus of kidney: Secondary | ICD-10-CM

## 2014-04-09 DIAGNOSIS — E559 Vitamin D deficiency, unspecified: Secondary | ICD-10-CM

## 2014-04-09 LAB — RENAL FUNCTION PANEL
ALBUMIN: 4 g/dL (ref 3.5–5.2)
BUN: 13 mg/dL (ref 6–23)
CHLORIDE: 108 meq/L (ref 96–112)
CO2: 25 meq/L (ref 19–32)
Calcium: 9.9 mg/dL (ref 8.4–10.5)
Creatinine, Ser: 0.9 mg/dL (ref 0.4–1.2)
GFR: 83.48 mL/min (ref >60.00)
Glucose, Bld: 89 mg/dL (ref 70–99)
Phosphorus: 3.1 mg/dL (ref 2.3–4.6)
Potassium: 4.1 mEq/L (ref 3.5–5.1)
Sodium: 137 mEq/L (ref 135–145)

## 2014-04-10 LAB — VITAMIN D 25 HYDROXY (VIT D DEFICIENCY, FRACTURES): VITD: 31.15 ng/mL (ref 30.00–100.00)

## 2014-04-10 LAB — TSH: TSH: 27.41 u[IU]/mL — ABNORMAL HIGH (ref 0.35–4.50)

## 2014-04-10 LAB — T4, FREE: FREE T4: 0.67 ng/dL (ref 0.60–1.60)

## 2014-04-12 ENCOUNTER — Ambulatory Visit (INDEPENDENT_AMBULATORY_CARE_PROVIDER_SITE_OTHER): Payer: 59 | Admitting: Family Medicine

## 2014-04-12 ENCOUNTER — Encounter: Payer: Self-pay | Admitting: Family Medicine

## 2014-04-12 VITALS — BP 141/77 | HR 74 | Temp 98.1°F | Ht 64.5 in | Wt 187.0 lb

## 2014-04-12 DIAGNOSIS — E039 Hypothyroidism, unspecified: Secondary | ICD-10-CM

## 2014-04-12 DIAGNOSIS — G43909 Migraine, unspecified, not intractable, without status migrainosus: Secondary | ICD-10-CM

## 2014-04-12 MED ORDER — SYNTHROID 175 MCG PO TABS: 175.0000 ug | ORAL_TABLET | Freq: Every day | ORAL | Status: DC

## 2014-04-12 NOTE — Progress Notes (Signed)
Pre visit review using our clinic review tool, if applicable. No additional management support is needed unless otherwise documented below in the visit note. 

## 2014-04-12 NOTE — Patient Instructions (Addendum)
Vitamin D 3 2000 to 3000 IU daily Krill oil or fish oil caps daily DASH Eating Plan DASH stands for "Dietary Approaches to Stop Hypertension." The DASH eating plan is a healthy eating plan that has been shown to reduce high blood pressure (hypertension). Additional health benefits may include reducing the risk of type 2 diabetes mellitus, heart disease, and stroke. The DASH eating plan may also help with weight loss. WHAT DO I NEED TO KNOW ABOUT THE DASH EATING PLAN? For the DASH eating plan, you will follow these general guidelines:  Choose foods with a percent daily value for sodium of less than 5% (as listed on the food label).  Use salt-free seasonings or herbs instead of table salt or sea salt.  Check with your health care provider or pharmacist before using salt substitutes.  Eat lower-sodium products, often labeled as "lower sodium" or "no salt added."  Eat fresh foods.  Eat more vegetables, fruits, and low-fat dairy products.  Choose whole grains. Look for the word "whole" as the first word in the ingredient list.  Choose fish and skinless chicken or [place] more often than red meat. Limit fish, poultry, and meat to 6 oz (170 g) each day.  Limit sweets, desserts, sugars, and sugary drinks.  Choose heart-healthy fats.  Limit cheese to 1 oz (28 g) per day.  Eat more home-cooked food and less restaurant, buffet, and fast food.  Limit fried foods.  Cook foods using methods other than frying.  Limit canned vegetables. If you do use them, rinse them well to decrease the sodium.  When eating at a restaurant, ask that your food be prepared with less salt, or no salt if possible. WHAT FOODS CAN I EAT? Seek help from a dietitian for individual calorie needs. Grains Whole grain or whole wheat bread. Brown rice. Whole grain or whole wheat pasta. Quinoa, bulgur, and whole grain cereals. Low-sodium cereals. Corn or whole wheat flour tortillas. Whole grain cornbread. Whole grain  crackers. Low-sodium crackers. Vegetables Fresh or frozen vegetables (raw, steamed, roasted, or grilled). Low-sodium or reduced-sodium tomato and vegetable juices. Low-sodium or reduced-sodium tomato sauce and paste. Low-sodium or reduced-sodium canned vegetables.  Fruits All fresh, canned (in natural juice), or frozen fruits. Meat and Other Protein Products Ground beef (85% or leaner), grass-fed beef, or beef trimmed of fat. Skinless chicken or [place]. Ground chicken or [place]. Pork trimmed of fat. All fish and seafood. Eggs. Dried beans, peas, or lentils. Unsalted nuts and seeds. Unsalted canned beans. Dairy Low-fat dairy products, such as skim or 1% milk, 2% or reduced-fat cheeses, low-fat ricotta or cottage cheese, or plain low-fat yogurt. Low-sodium or reduced-sodium cheeses. Fats and Oils Tub margarines without trans fats. Light or reduced-fat mayonnaise and salad dressings (reduced sodium). Avocado. Safflower, olive, or canola oils. Natural peanut or almond butter. Other Unsalted popcorn and pretzels. The items listed above may not be a complete list of recommended foods or beverages. Contact your dietitian for more options. WHAT FOODS ARE NOT RECOMMENDED? Grains White bread. White pasta. White rice. Refined cornbread. Bagels and croissants. Crackers that contain trans fat. Vegetables Creamed or fried vegetables. Vegetables in a cheese sauce. Regular canned vegetables. Regular canned tomato sauce and paste. Regular tomato and vegetable juices. Fruits Dried fruits. Canned fruit in light or heavy syrup. Fruit juice. Meat and Other Protein Products Fatty cuts of meat. Ribs, chicken wings, bacon, sausage, bologna, salami, chitterlings, fatback, hot dogs, bratwurst, and packaged luncheon meats. Salted nuts and seeds. Canned beans with salt.  Dairy Whole or 2% milk, cream, half-and-half, and cream cheese. Whole-fat or sweetened yogurt. Full-fat cheeses or blue cheese. Nondairy creamers and  whipped toppings. Processed cheese, cheese spreads, or cheese curds. Condiments Onion and garlic salt, seasoned salt, table salt, and sea salt. Canned and packaged gravies. Worcestershire sauce. Tartar sauce. Barbecue sauce. Teriyaki sauce. Soy sauce, including reduced sodium. Steak sauce. Fish sauce. Oyster sauce. Cocktail sauce. Horseradish. Ketchup and mustard. Meat flavorings and tenderizers. Bouillon cubes. Hot sauce. Tabasco sauce. Marinades. Taco seasonings. Relishes. Fats and Oils Butter, stick margarine, lard, shortening, ghee, and bacon fat. Coconut, palm kernel, or palm oils. Regular salad dressings. Other Pickles and olives. Salted popcorn and pretzels. The items listed above may not be a complete list of foods and beverages to avoid. Contact your dietitian for more information. WHERE CAN I FIND MORE INFORMATION? National Heart, Lung, and Blood Institute: travelstabloid.com Document Released: 06/24/2011 Document Revised: 11/19/2013 Document Reviewed: 05/09/2013 [facility] Patient Information 2015 [place], [place]. This information is not intended to replace advice given to you by your health care provider. Make sure you discuss any questions you have with your health care provider. Moist heat and [employer] patches daily  Back Pain, Adult Low back pain is very common. About 1 in 5 people have back pain.The cause of low back pain is rarely dangerous. The pain often gets better over time.About half of people with a sudden onset of back pain feel better in just 2 weeks. About 8 in 10 people feel better by 6 weeks.  CAUSES Some common causes of back pain include: Strain of the muscles or ligaments supporting the spine. Wear and tear (degeneration) of the spinal discs. Arthritis. Direct injury to the back. DIAGNOSIS Most of the time, the direct cause of low back pain is not known.However, back pain can be treated effectively even when the exact cause of  the pain is unknown.Answering your caregiver's questions about your overall health and symptoms is one of the most accurate ways to make sure the cause of your pain is not dangerous. If your caregiver needs more information, he or she may order lab work or imaging tests (X-rays or MRIs).However, even if imaging tests show changes in your back, this usually does not require surgery. HOME CARE INSTRUCTIONS For many people, back pain returns.Since low back pain is rarely dangerous, it is often a condition that people can learn to [facility] their own.  Remain active. It is stressful on the back to sit or stand in one place. Do not sit, drive, or stand in one place for more than 30 minutes at a time. Take short walks on level surfaces as soon as pain allows.Try to increase the length of time you walk each day. Do not stay in bed.Resting more than 1 or 2 days can delay your recovery. Do not avoid exercise or work.Your body is made to move.It is not dangerous to be active, even though your back may hurt.Your back will likely heal faster if you return to being active before your pain is gone. Pay attention to your body when you bend and lift. Many people have less discomfortwhen lifting if they bend their knees, keep the load close to their bodies,and avoid twisting. Often, the most comfortable positions are those that put less stress on your recovering back. Find a comfortable position to sleep. Use a firm mattress and lie on your side with your knees slightly bent. If you lie on your back, put a pillow under your knees. Only  take over-the-counter or prescription medicines as directed by your caregiver. Over-the-counter medicines to reduce pain and inflammation are often the most helpful.Your caregiver may prescribe muscle relaxant drugs.These medicines help dull your pain so you can more quickly return to your normal activities and healthy exercise. Put ice on the injured area. Put ice in a plastic  bag. Place a towel between your skin and the bag. Leave the ice on for 15-20 minutes, 03-04 times a day for the first 2 to 3 days. After that, ice and heat may be alternated to reduce pain and spasms. Ask your caregiver about trying back exercises and gentle massage. This may be of some benefit. Avoid feeling anxious or stressed.Stress increases muscle tension and can worsen back pain.It is important to recognize when you are anxious or stressed and learn ways to manage it.Exercise is a great option. SEEK MEDICAL CARE IF: You have pain that is not relieved with rest or medicine. You have pain that does not improve in 1 week. You have new symptoms. You are generally not feeling well. SEEK IMMEDIATE MEDICAL CARE IF:  You have pain that radiates from your back into your legs. You develop new bowel or bladder control problems. You have unusual weakness or numbness in your arms or legs. You develop nausea or vomiting. You develop abdominal pain. You feel faint. Document Released: 07/05/2005 Document Revised: 01/04/2012 Document Reviewed: 11/06/2013 [facility] Patient Information 2015 [place], [place]. This information is not intended to replace advice given to you by your health care provider. Make sure you discuss any questions you have with your health care provider.

## 2014-04-12 NOTE — Progress Notes (Signed)
Patient ID: CAMBER NINH, female   DOB: 08-06-1970, 43 y.o.   MRN: [id number] Katie Henry [id number] 02-Aug-1970 04/12/2014      Progress Note-Follow Up  Subjective  Chief Complaint  Chief Complaint  Patient presents with  . Follow-up    6 month    HPI  Patient is a 43 year old female in today for routine medical care. She is generally doing well. Has recently gotten married. Does acknowledge she is having excessive fatigue and weight gain and was not surprisingly her her thyroid was off she has had some recent increase in back pain and right shoulder. Denies CP/palp/SOB/HA/congestion/fevers/GI or GU c/o. Taking meds as prescribed  Past Medical History  Diagnosis Date  . 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 Migraine   . History of kidney stones 2003    onset with pregnacy   . Hyperthyroidism   . History of UTI   . Dental infection 05/28/2011  . Graves disease   . Endometriosis   . IBS (irritable bowel syndrome)   . Vitamin D deficiency   . Syncope     Past Surgical History  Procedure Laterality Date  . Appendectomy  1991  . Endometrial ablation  01/2007  . Vaginal hysterectomy  2010    ovaries still in place    Family History  Problem Relation Age of Onset  . Prostate cancer      maternal and paternal grandparents  . Hyperlipidemia Mother   . Hyperlipidemia Maternal Grandmother   . Diabetes Maternal Grandmother   . Kidney disease Maternal Grandmother   . Hyperlipidemia Maternal Grandfather   . Heart disease Maternal Grandfather 38    MI  . Prostate cancer Maternal Grandfather   . Heart attack Maternal Aunt     deceased age 20  . Hypertension Father   . Dementia Father   . Seizures Father     s/p TBI  . Hypertension Paternal Grandfather   . Graves' disease Sister   . Obesity Brother     History   Social History  . Marital Status: Single    Spouse Name: N/A    Number of Children: 2  . Years of Education: 13   Occupational History   .  [place]   Social History Main Topics  . Smoking status: Former Smoker    Types: Cigarettes  . Smokeless tobacco: Never Used     Comment: quit one year ago.  . Alcohol Use: 0.6 oz/week    1 Glasses of wine per week  . Drug Use: No  . Sexual Activity: Yes    Partners: Male     Comment: work at [employer], lives with fiance and son   Other Topics Concern  . Not on file   Social History Narrative   Engaged, two sons.   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   No Tob, occ alcohol, no drugs.    Current Outpatient Prescriptions on File Prior to Visit  Medication Sig Dispense Refill  . Cholecalciferol (VITAMIN D) 1000 UNITS capsule Take 3 capsules (3,000 Units total) by mouth daily.      Marland Kitchen ibuprofen (ADVIL,MOTRIN) 400 MG tablet Take 1 tablet (400 mg total) by mouth 3 (three) times daily as needed.  90 tablet  2  . levothyroxine (SYNTHROID, LEVOTHROID) 150 MCG tablet Must dispense Synthroid  1 tab po daily except Tuesday and Saturday take 1/2 tab  90 tablet  0  . Magnesium 500 MG TABS Take 500 mg by mouth daily.      . Probiotic Product (ALIGN) 4 MG CAPS Take 1 capsule by mouth daily.  30 capsule  0  . scopolamine (TRANSDERM-SCOP) 1 MG/3DAYS Place 1 patch (1.5 mg total) onto the skin every 3 (three) days.  3 patch  0  . topiramate (TOPAMAX) 50 MG tablet Take 1 tablet (50 mg total) by mouth 2 (two) times daily.  60 tablet  12   No current facility-administered medications on file prior to visit.    Allergies  Allergen Reactions  . Amoxicillin   . Codeine   . Ultram [Tramadol Hcl]     Review of Systems  Review of Systems  Constitutional: Negative for fever and malaise/fatigue.  HENT: Negative for congestion.   Eyes: Negative for discharge.  Respiratory: Negative for shortness of breath.   Cardiovascular: Negative for chest pain, palpitations and leg swelling.  Gastrointestinal: Negative for nausea, abdominal pain and diarrhea.   Genitourinary: Negative for dysuria.  Musculoskeletal: Positive for back pain. Negative for falls.  Skin: Negative for rash.  Neurological: Negative for loss of consciousness and headaches.  Endo/Heme/Allergies: Negative for polydipsia.  Psychiatric/Behavioral: Negative for depression and suicidal ideas. The patient is not nervous/anxious and does not have insomnia.     Objective  BP 141/77  Pulse 74  Temp(Src) 98.1 F (36.7 C) (Oral)  Ht 5' 4.5" (1.638 m)  Wt 187 lb (84.823 kg)  BMI 31.61 kg/m2  SpO2 100%  Physical Exam  Physical Exam  Constitutional: She is oriented to person, place, and time and well-developed, well-nourished, and in no distress. No distress.  HENT:  Head: Normocephalic and atraumatic.  Eyes: Conjunctivae are normal.  Neck: Neck supple. No thyromegaly present.  Cardiovascular: Normal rate, regular rhythm and normal heart sounds.   No murmur heard. Pulmonary/Chest: Effort normal and breath sounds normal. She has no wheezes.  Abdominal: She exhibits no distension and no mass.  Musculoskeletal: She exhibits no edema.  Lymphadenopathy:    She has no cervical adenopathy.  Neurological: She is alert and oriented to person, place, and time.  Skin: Skin is warm and dry. No rash noted. She is not diaphoretic.  Psychiatric: Memory, affect and judgment normal.    Lab Results  Component Value Date   TSH 27.41* 04/09/2014   Lab Results  Component Value Date   WBC 7.1 10/05/2013   HGB 13.5 10/05/2013   HCT 39.8 10/05/2013   MCV 90.7 10/05/2013   PLT 387 10/05/2013   Lab Results  Component Value Date   CREATININE 0.9 04/09/2014   BUN 13 04/09/2014   NA 137 04/09/2014   K 4.1 04/09/2014   CL 108 04/09/2014   CO2 25 04/09/2014   Lab Results  Component Value Date   ALT 13 10/05/2013   AST 10 10/05/2013   ALKPHOS 48 10/05/2013   BILITOT 0.4 10/05/2013   Lab Results  Component Value Date   CHOL 164 10/05/2013   Lab Results  Component Value Date   HDL 53  10/05/2013   Lab Results  Component Value Date   LDLCALC 91 10/05/2013   Lab Results  Component Value Date   TRIG 102 10/05/2013   Lab Results  Component Value Date   CHOLHDL 3.1 10/05/2013     Assessment & Plan  Hypothyroidism On Levothyroxine, continue to monitor  Migraines Encouraged increased hydration, 64 ounces of clear fluids daily. Minimize alcohol and caffeine. Eat small frequent meals  with lean proteins and complex carbs. Avoid high and low blood sugars. Get adequate sleep, 7-8 hours a night. Needs exercise daily preferably in the morning.

## 2014-04-14 NOTE — Assessment & Plan Note (Signed)
On Levothyroxine, continue to monitor 

## 2014-04-14 NOTE — Assessment & Plan Note (Signed)
Encouraged increased hydration, 64 ounces of clear fluids daily. Minimize alcohol and caffeine. Eat small frequent meals with lean proteins and complex carbs. Avoid high and low blood sugars. Get adequate sleep, 7-8 hours a night. Needs exercise daily preferably in the morning.  

## 2014-05-09 ENCOUNTER — Other Ambulatory Visit: Payer: Self-pay | Admitting: Neurology

## 2014-06-28 ENCOUNTER — Other Ambulatory Visit (INDEPENDENT_AMBULATORY_CARE_PROVIDER_SITE_OTHER): Payer: 59

## 2014-06-28 DIAGNOSIS — E039 Hypothyroidism, unspecified: Secondary | ICD-10-CM

## 2014-06-28 LAB — TSH: TSH: 2.56 u[IU]/mL (ref 0.35–4.50)

## 2014-06-28 LAB — T4, FREE: Free T4: 0.99 ng/dL (ref 0.60–1.60)

## 2014-07-05 ENCOUNTER — Other Ambulatory Visit: Payer: Self-pay | Admitting: Neurology

## 2014-08-06 ENCOUNTER — Other Ambulatory Visit: Payer: Self-pay | Admitting: Family Medicine

## 2014-08-06 ENCOUNTER — Other Ambulatory Visit: Payer: Self-pay

## 2014-08-06 ENCOUNTER — Other Ambulatory Visit: Payer: Self-pay | Admitting: Neurology

## 2014-08-06 DIAGNOSIS — Z1231 Encounter for screening mammogram for malignant neoplasm of breast: Secondary | ICD-10-CM

## 2014-08-07 NOTE — Telephone Encounter (Signed)
Last TSH 06/28/14 within normal limits.   Rx refill sent.  eal

## 2014-08-07 NOTE — Telephone Encounter (Signed)
Patient has not been seen in over 1 year.  I called, got no answer.  Left message.

## 2014-08-19 ENCOUNTER — Ambulatory Visit: Payer: 59

## 2014-08-26 ENCOUNTER — Ambulatory Visit
Admission: RE | Admit: 2014-08-26 | Discharge: 2014-08-26 | Disposition: A | Discharge status: Home / Self Care | Payer: 59 | Source: Ambulatory Visit

## 2014-08-26 DIAGNOSIS — Z1231 Encounter for screening mammogram for malignant neoplasm of breast: Secondary | ICD-10-CM

## 2014-08-27 ENCOUNTER — Other Ambulatory Visit: Payer: Self-pay | Admitting: Family Medicine

## 2014-08-27 DIAGNOSIS — N644 Mastodynia: Secondary | ICD-10-CM

## 2014-08-28 ENCOUNTER — Telehealth: Payer: Self-pay | Admitting: Family Medicine

## 2014-08-28 NOTE — Telephone Encounter (Signed)
Caller name: kristi Relation to pt: self Call back number: [phone] Pharmacy: [facility] outpatient pharmacy  Reason for call:   Patient states that her neurologist has "signed off" on her as a patient and states that Dr. Charlett Blake can prescribe patient topiramate (TOPAMAX) for patient. Patient wants to know if Dr. Charlett Blake will start presribing this for her.

## 2014-08-29 NOTE — Telephone Encounter (Signed)
Sure I am willing to write her Topamax same sig number 60 with 2 rf til seen

## 2014-08-30 MED ORDER — TOPIRAMATE 50 MG PO TABS: ORAL_TABLET | ORAL | Status: DC

## 2014-08-30 NOTE — Telephone Encounter (Signed)
Patient informed PCP ok to fill topamax.  Sent in refill to [place] as PCP instructed.

## 2014-09-04 ENCOUNTER — Ambulatory Visit
Admission: RE | Admit: 2014-09-04 | Discharge: 2014-09-04 | Disposition: A | Discharge status: Home / Self Care | Payer: 59 | Source: Ambulatory Visit | Attending: Family Medicine | Admitting: Family Medicine

## 2014-09-04 DIAGNOSIS — N644 Mastodynia: Secondary | ICD-10-CM

## 2014-10-03 ENCOUNTER — Telehealth: Payer: Self-pay | Admitting: Family Medicine

## 2014-10-03 NOTE — Telephone Encounter (Signed)
CPE letter sent °

## 2014-10-09 ENCOUNTER — Other Ambulatory Visit: Payer: Self-pay | Admitting: Family Medicine

## 2014-10-09 ENCOUNTER — Encounter: Payer: Self-pay | Admitting: Family Medicine

## 2014-10-09 DIAGNOSIS — E039 Hypothyroidism, unspecified: Secondary | ICD-10-CM

## 2014-10-09 DIAGNOSIS — Z0000 Encounter for general adult medical examination without abnormal findings: Secondary | ICD-10-CM

## 2014-10-09 DIAGNOSIS — E559 Vitamin D deficiency, unspecified: Secondary | ICD-10-CM

## 2014-10-14 ENCOUNTER — Encounter: Payer: 59 | Admitting: Family Medicine

## 2014-10-17 ENCOUNTER — Other Ambulatory Visit (INDEPENDENT_AMBULATORY_CARE_PROVIDER_SITE_OTHER): Payer: 59

## 2014-10-17 DIAGNOSIS — E559 Vitamin D deficiency, unspecified: Secondary | ICD-10-CM | POA: Diagnosis not present

## 2014-10-17 DIAGNOSIS — E039 Hypothyroidism, unspecified: Secondary | ICD-10-CM | POA: Diagnosis not present

## 2014-10-17 DIAGNOSIS — Z0000 Encounter for general adult medical examination without abnormal findings: Secondary | ICD-10-CM | POA: Diagnosis not present

## 2014-10-17 LAB — TSH: TSH: 0.34 u[IU]/mL — ABNORMAL LOW (ref 0.35–4.50)

## 2014-10-17 LAB — URINALYSIS, ROUTINE W REFLEX MICROSCOPIC
BILIRUBIN URINE: NEGATIVE
HGB URINE DIPSTICK: NEGATIVE
KETONES UR: NEGATIVE
Leukocytes, UA: NEGATIVE
Nitrite: POSITIVE — AB
RBC / HPF: NONE SEEN (ref 0–?)
Specific Gravity, Urine: 1.02 (ref 1.000–1.030)
Total Protein, Urine: NEGATIVE
URINE GLUCOSE: NEGATIVE
UROBILINOGEN UA: 0.2 (ref 0.0–1.0)
pH: 6 (ref 5.0–8.0)

## 2014-10-17 LAB — VITAMIN D 25 HYDROXY (VIT D DEFICIENCY, FRACTURES): VITD: 19.36 ng/mL — ABNORMAL LOW (ref 30.00–100.00)

## 2014-10-17 LAB — COMPREHENSIVE METABOLIC PANEL
ALBUMIN: 3.8 g/dL (ref 3.5–5.2)
ALT: 14 U/L (ref 0–35)
AST: 12 U/L (ref 0–37)
Alkaline Phosphatase: 56 U/L (ref 39–117)
BUN: 14 mg/dL (ref 6–23)
CHLORIDE: 107 meq/L (ref 96–112)
CO2: 25 mEq/L (ref 19–32)
Calcium: 9.8 mg/dL (ref 8.4–10.5)
Creatinine, Ser: 0.7 mg/dL (ref 0.40–1.20)
GFR: 117.02 mL/min (ref >60.00)
GLUCOSE: 84 mg/dL (ref 70–99)
Potassium: 4.2 mEq/L (ref 3.5–5.1)
Sodium: 136 mEq/L (ref 135–145)
Total Bilirubin: 0.3 mg/dL (ref 0.2–1.2)
Total Protein: 6.6 g/dL (ref 6.0–8.3)

## 2014-10-17 LAB — CBC
HCT: 40.2 % (ref 36.0–46.0)
HEMOGLOBIN: 13.7 g/dL (ref 12.0–15.0)
MCHC: 34 g/dL (ref 30.0–36.0)
MCV: 90 fl (ref 78.0–100.0)
Platelets: 395 10*3/uL (ref 150.0–400.0)
RBC: 4.46 Mil/uL (ref 3.87–5.11)
RDW: 12.5 % (ref 11.5–15.5)
WBC: 9.8 10*3/uL (ref 4.0–10.5)

## 2014-10-18 ENCOUNTER — Other Ambulatory Visit: Payer: Self-pay | Admitting: Family Medicine

## 2014-10-18 ENCOUNTER — Other Ambulatory Visit: Payer: 59

## 2014-10-18 DIAGNOSIS — R8271 Bacteriuria: Secondary | ICD-10-CM

## 2014-10-18 DIAGNOSIS — I10 Essential (primary) hypertension: Secondary | ICD-10-CM

## 2014-10-18 DIAGNOSIS — E559 Vitamin D deficiency, unspecified: Secondary | ICD-10-CM

## 2014-10-18 DIAGNOSIS — E038 Other specified hypothyroidism: Secondary | ICD-10-CM

## 2014-10-18 MED ORDER — VITAMIN D (ERGOCALCIFEROL) 1.25 MG (50000 UNIT) PO CAPS: 50000.0000 [IU] | ORAL_CAPSULE | ORAL | Status: DC

## 2014-10-18 NOTE — Telephone Encounter (Signed)
Labs entered and prescription sent in.

## 2014-10-21 ENCOUNTER — Ambulatory Visit (INDEPENDENT_AMBULATORY_CARE_PROVIDER_SITE_OTHER): Payer: 59 | Admitting: Family Medicine

## 2014-10-21 ENCOUNTER — Encounter: Payer: Self-pay | Admitting: Family Medicine

## 2014-10-21 VITALS — BP 126/90 | HR 76 | Temp 98.0°F | Resp 16 | Ht 63.0 in | Wt 185.0 lb

## 2014-10-21 DIAGNOSIS — E559 Vitamin D deficiency, unspecified: Secondary | ICD-10-CM | POA: Diagnosis not present

## 2014-10-21 DIAGNOSIS — E039 Hypothyroidism, unspecified: Secondary | ICD-10-CM | POA: Diagnosis not present

## 2014-10-21 DIAGNOSIS — G43909 Migraine, unspecified, not intractable, without status migrainosus: Secondary | ICD-10-CM

## 2014-10-21 DIAGNOSIS — N390 Urinary tract infection, site not specified: Secondary | ICD-10-CM

## 2014-10-21 DIAGNOSIS — K589 Irritable bowel syndrome without diarrhea: Secondary | ICD-10-CM

## 2014-10-21 LAB — URINE CULTURE: Colony Count: >=100000

## 2014-10-21 MED ORDER — SULFAMETHOXAZOLE-TRIMETHOPRIM 800-160 MG PO TABS
1.0000 | ORAL_TABLET | Freq: Two times a day (BID) | ORAL | Status: DC
Start: 2014-10-21 — End: 2015-04-16

## 2014-10-21 NOTE — Patient Instructions (Signed)
Hypothyroidism The thyroid is a large gland located in the lower front of your neck. The thyroid gland helps control metabolism. Metabolism is how your body handles food. It controls metabolism with the hormone thyroxine. When this gland is underactive (hypothyroid), it produces too little hormone.  CAUSES These include:   Absence or destruction of thyroid tissue.  Goiter due to iodine deficiency.  Goiter due to medications.  Congenital defects (since birth).  Problems with the pituitary. This causes a lack of TSH (thyroid stimulating hormone). This hormone tells the thyroid to turn out more hormone. SYMPTOMS  Lethargy (feeling as though you have no energy)  Cold intolerance  Weight gain (in spite of normal food intake)  Dry skin  Coarse hair  Menstrual irregularity (if severe, may lead to infertility)  Slowing of thought processes Cardiac problems are also caused by insufficient amounts of thyroid hormone. Hypothyroidism in the newborn is cretinism, and is an extreme form. It is important that this form be treated adequately and immediately or it will lead rapidly to retarded physical and mental development. DIAGNOSIS  To prove hypothyroidism, your caregiver may do blood tests and ultrasound tests. Sometimes the signs are hidden. It may be necessary for your caregiver to watch this illness with blood tests either before or after diagnosis and treatment. TREATMENT  Low levels of thyroid hormone are increased by using synthetic thyroid hormone. This is a safe, effective treatment. It usually takes about four weeks to gain the full effects of the medication. After you have the full effect of the medication, it will generally take another four weeks for problems to leave. Your caregiver may start you on low doses. If you have had heart problems the dose may be gradually increased. It is generally not an emergency to get rapidly to normal. HOME CARE INSTRUCTIONS   Take your  medications as your caregiver suggests. Let your caregiver know of any medications you are taking or start taking. Your caregiver will help you with dosage schedules.  As your condition improves, your dosage needs may increase. It will be necessary to have continuing blood tests as suggested by your caregiver.  Report all suspected medication side effects to your caregiver. SEEK MEDICAL CARE IF: Seek medical care if you develop:  Sweating.  Tremulousness (tremors).  Anxiety.  Rapid weight loss.  Heat intolerance.  Emotional swings.  Diarrhea.  Weakness. SEEK IMMEDIATE MEDICAL CARE IF:  You develop chest pain, an irregular heart beat (palpitations), or a rapid heart beat. MAKE SURE YOU:   Understand these instructions.  Will watch your condition.  Will get help right away if you are not doing well or get worse. Document Released: 07/05/2005 Document Revised: 09/27/2011 Document Reviewed: 02/23/2008 Exit[employer] Patient Information 2015 [employer]. This information is not intended to replace advice given to you by your health care provider. Make sure you discuss any questions you have with your health care provider.  

## 2014-10-27 ENCOUNTER — Encounter: Payer: Self-pay | Admitting: Family Medicine

## 2014-10-27 NOTE — Assessment & Plan Note (Signed)
Encouraged increased hydration, 64 ounces of clear fluids daily. Minimize alcohol and caffeine. Eat small frequent meals with lean proteins and complex carbs. Avoid high and low blood sugars. Get adequate sleep, 7-8 hours a night. Needs exercise daily preferably in the morning. Doing well, infrequent at this time

## 2014-10-27 NOTE — Progress Notes (Signed)
Katie Henry  [id number] [birth year] 10/27/2014      Progress Note-Follow Up  Subjective  Chief Complaint  Chief Complaint  Patient presents with  . Annual Exam    HPI  Patient is in today for follow-up. She is doing fairly well. No recent illness. Has had some trouble with right leg pain but it is manageable. She has low vitamin D but is not picked up her supplement yet. Patient is a 44 y.o. female in today for routine medical care. Denies CP/palp/SOB/HA/congestion/fevers/GI or GU c/o. Taking meds as prescribed  Past Medical History  Diagnosis Date  . 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 Migraine   . History of kidney stones 2003    onset with pregnacy   . Hyperthyroidism   . History of UTI   . Dental infection 05/28/2011  . Graves disease   . Endometriosis   . IBS (irritable bowel syndrome)   . Vitamin D deficiency   . Syncope     Past Surgical History  Procedure Laterality Date  . Appendectomy  1991  . Endometrial ablation  01/2007  . Vaginal hysterectomy  2010    ovaries still in place    Family History  Problem Relation Age of Onset  . Prostate cancer      maternal and paternal grandparents  . Hyperlipidemia Mother   . Hyperlipidemia Maternal Grandmother   . Diabetes Maternal Grandmother   . Kidney disease Maternal Grandmother   . Hyperlipidemia Maternal Grandfather   . Heart disease Maternal Grandfather 64    MI  . Prostate cancer Maternal Grandfather   . Heart attack Maternal Aunt     deceased age 27  . Hypertension Father   . Dementia Father   . Seizures Father     s/p TBI  . Hypertension Paternal Grandfather   . Graves' disease Sister   . Obesity Brother     History   Social History  . Marital Status: Married    Spouse Name: N/A  . Number of Children: 2  . Years of Education: 13   Occupational History  .  [employer]   Social History Main Topics  . Smoking status: Former Smoker    Types: Cigarettes  . Smokeless  tobacco: Never Used     Comment: quit one year ago.  . Alcohol Use: 0.6 oz/week    1 Glasses of wine per week  . Drug Use: No  . Sexual Activity:    Partners: Male     Comment: work at [employer], lives with fiance and son   Other Topics Concern  . Not on file   Social History Narrative   Engaged, two sons.   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   No Tob, occ alcohol, no drugs.    Current Outpatient Prescriptions on File Prior to Visit  Medication Sig Dispense Refill  . Cholecalciferol (VITAMIN D) 1000 UNITS capsule Take 3 capsules (3,000 Units total) by mouth daily.    Marland Kitchen ibuprofen (ADVIL,MOTRIN) 400 MG tablet Take 1 tablet (400 mg total) by mouth 3 (three) times daily as needed. 90 tablet 2  . Magnesium 500 MG TABS Take 500 mg by mouth daily.    . Probiotic Product (ALIGN) 4 MG CAPS Take 1 capsule by mouth daily. 30 capsule 0  . scopolamine (TRANSDERM-SCOP) 1 MG/3DAYS Place 1 patch (1.5 mg total) onto the skin every 3 (three) days. 3 patch 0  .  SYNTHROID 175 MCG tablet TAKE 1 TABLET BY MOUTH DAILY BEFORE BREAKFAST 90 tablet 1  . topiramate (TOPAMAX) 50 MG tablet TAKE 1 TABLET BY MOUTH 2 TIMES DAILY. 60 tablet 2  . Vitamin D, Ergocalciferol, (DRISDOL) 50000 UNITS CAPS capsule Take 1 capsule (50,000 Units total) by mouth every 7 (seven) days. 4 capsule 3   No current facility-administered medications on file prior to visit.    Allergies  Allergen Reactions  . Amoxicillin Nausea And Vomiting  . Codeine Nausea And Vomiting  . Ultram [Tramadol Hcl] Nausea And Vomiting    Review of Systems  Review of Systems  Constitutional: Negative for fever and malaise/fatigue.  HENT: Negative for congestion.   Eyes: Negative for discharge.  Respiratory: Negative for shortness of breath.   Cardiovascular: Negative for chest pain, palpitations and leg swelling.  Gastrointestinal: Negative for nausea, abdominal pain and diarrhea.  Genitourinary:  Negative for dysuria.  Musculoskeletal: Negative for falls.  Skin: Negative for rash.  Neurological: Negative for loss of consciousness and headaches.  Endo/Heme/Allergies: Negative for polydipsia.  Psychiatric/Behavioral: Negative for depression and suicidal ideas. The patient is not nervous/anxious and does not have insomnia.     Objective  BP 126/90 mmHg  Pulse 76  Temp(Src) 98 F (36.7 C) (Oral)  Resp 16  Ht 5\' 3"  (1.6 m)  Wt 185 lb (83.915 kg)  BMI 32.78 kg/m2  SpO2 98%  Physical Exam  Physical Exam  Constitutional: She is oriented to person, place, and time and well-developed, well-nourished, and in no distress. No distress.  HENT:  Head: Normocephalic and atraumatic.  Eyes: Conjunctivae are normal.  Neck: Neck supple. No thyromegaly present.  Cardiovascular: Normal rate, regular rhythm and normal heart sounds.   No murmur heard. Pulmonary/Chest: Effort normal and breath sounds normal. She has no wheezes.  Abdominal: She exhibits no distension and no mass.  Musculoskeletal: She exhibits no edema.  Lymphadenopathy:    She has no cervical adenopathy.  Neurological: She is alert and oriented to person, place, and time.  Skin: Skin is warm and dry. No rash noted. She is not diaphoretic.  Psychiatric: Memory, affect and judgment normal.    Lab Results  Component Value Date   TSH 0.34* 10/17/2014   Lab Results  Component Value Date   WBC 9.8 10/17/2014   HGB 13.7 10/17/2014   HCT 40.2 10/17/2014   MCV 90.0 10/17/2014   PLT 395.0 10/17/2014   Lab Results  Component Value Date   CREATININE 0.70 10/17/2014   BUN 14 10/17/2014   NA 136 10/17/2014   K 4.2 10/17/2014   CL 107 10/17/2014   CO2 25 10/17/2014   Lab Results  Component Value Date   ALT 14 10/17/2014   AST 12 10/17/2014   ALKPHOS 56 10/17/2014   BILITOT 0.3 10/17/2014   Lab Results  Component Value Date   CHOL 164 10/05/2013   Lab Results  Component Value Date   HDL 53 10/05/2013   Lab  Results  Component Value Date   LDLCALC 91 10/05/2013   Lab Results  Component Value Date   TRIG 102 10/05/2013   Lab Results  Component Value Date   CHOLHDL 3.1 10/05/2013     Assessment & Plan  Migraines Encouraged increased hydration, 64 ounces of clear fluids daily. Minimize alcohol and caffeine. Eat small frequent meals with lean proteins and complex carbs. Avoid high and low blood sugars. Get adequate sleep, 7-8 hours a night. Needs exercise daily preferably in the morning. Doing  well, infrequent at this time   Vitamin D deficiency Started on Vitamin D 50000 IU q week and will monitor   Hypothyroidism On Levothyroxine, continue to monitor   IBS (irritable bowel syndrome) Avoid offending foods, start probiotics. Do not eat large meals in late evening

## 2014-10-27 NOTE — Assessment & Plan Note (Signed)
Started on Vitamin D 50000 IU q week and will monitor

## 2014-10-27 NOTE — Assessment & Plan Note (Signed)
Avoid offending foods, start probiotics. Do not eat large meals in late evening 

## 2014-10-27 NOTE — Assessment & Plan Note (Signed)
On Levothyroxine, continue to monitor 

## 2014-12-13 ENCOUNTER — Other Ambulatory Visit: Payer: Self-pay | Admitting: Family Medicine

## 2014-12-13 ENCOUNTER — Encounter: Payer: Self-pay | Admitting: Family Medicine

## 2014-12-13 MED ORDER — NITROFURANTOIN MONOHYD MACRO 100 MG PO CAPS: 100.0000 mg | ORAL_CAPSULE | Freq: Two times a day (BID) | ORAL | Status: DC

## 2015-01-10 ENCOUNTER — Telehealth: Payer: Self-pay | Admitting: Family Medicine

## 2015-01-10 NOTE — Telephone Encounter (Signed)
[facility] - Client [facility] Patient Name: Katie Henry DOB: 02-Aug-1970 Initial Comment Caller says that she had heart palpitations early this week Nurse Assessment Nurse: Donalynn Furlong, RN, Myna Hidalgo Date/Time Eilene Ghazi Time): 01/10/2015 9:37:23 AM Confirm and document reason for call. If symptomatic, describe symptoms. ---Caller says that she had heart palpitations early this week . Not experiencing them now, at work currently. Has the patient traveled out of the country within the last 30 days? ---No Does the patient require triage? ---Yes Related visit to physician within the last 2 weeks? ---No Does the PT have any chronic conditions? (i.e. diabetes, asthma, etc.) ---Yes List chronic conditions. ---Graves Disease, migraines Did the patient indicate they were pregnant? ---No Guidelines Guideline Title Affirmed Question Affirmed Notes Heart Rate and Heartbeat Questions Palpitations (all triage questions negative) Final Disposition User Guthrie, RN, Myna Hidalgo

## 2015-01-10 NOTE — Telephone Encounter (Signed)
FYI

## 2015-01-10 NOTE — Telephone Encounter (Signed)
Patient states that she has been having chest tightness and heart palpitations earlier this week. Call transferred to [facility].

## 2015-01-31 ENCOUNTER — Other Ambulatory Visit (INDEPENDENT_AMBULATORY_CARE_PROVIDER_SITE_OTHER): Payer: 59

## 2015-01-31 DIAGNOSIS — I10 Essential (primary) hypertension: Secondary | ICD-10-CM | POA: Diagnosis not present

## 2015-01-31 DIAGNOSIS — E559 Vitamin D deficiency, unspecified: Secondary | ICD-10-CM | POA: Diagnosis not present

## 2015-01-31 DIAGNOSIS — E038 Other specified hypothyroidism: Secondary | ICD-10-CM | POA: Diagnosis not present

## 2015-01-31 LAB — CBC WITH DIFFERENTIAL/PLATELET
BASOS ABS: 0.1 10*3/uL (ref 0.0–0.1)
BASOS PCT: 1 % (ref 0.0–3.0)
EOS ABS: 0.2 10*3/uL (ref 0.0–0.7)
EOS PCT: 2.5 % (ref 0.0–5.0)
HCT: 41.6 % (ref 36.0–46.0)
Hemoglobin: 14.2 g/dL (ref 12.0–15.0)
Lymphocytes Relative: 27.9 % (ref 12.0–46.0)
Lymphs Abs: 2.5 10*3/uL (ref 0.7–4.0)
MCHC: 34.1 g/dL (ref 30.0–36.0)
MCV: 91.9 fl (ref 78.0–100.0)
Monocytes Absolute: 0.6 10*3/uL (ref 0.1–1.0)
Monocytes Relative: 7 % (ref 3.0–12.0)
NEUTROS ABS: 5.5 10*3/uL (ref 1.4–7.7)
Neutrophils Relative %: 61.6 % (ref 43.0–77.0)
Platelets: 379 10*3/uL (ref 150.0–400.0)
RBC: 4.53 Mil/uL (ref 3.87–5.11)
RDW: 12.7 % (ref 11.5–15.5)
WBC: 8.9 10*3/uL (ref 4.0–10.5)

## 2015-01-31 LAB — COMPREHENSIVE METABOLIC PANEL
ALT: 29 U/L (ref 0–35)
AST: 20 U/L (ref 0–37)
Albumin: 3.8 g/dL (ref 3.5–5.2)
Alkaline Phosphatase: 57 U/L (ref 39–117)
BUN: 12 mg/dL (ref 6–23)
CO2: 20 mEq/L (ref 19–32)
CREATININE: 0.66 mg/dL (ref 0.40–1.20)
Calcium: 9 mg/dL (ref 8.4–10.5)
Chloride: 108 mEq/L (ref 96–112)
GFR: 125.08 mL/min (ref >60.00)
Glucose, Bld: 88 mg/dL (ref 70–99)
Potassium: 4.1 mEq/L (ref 3.5–5.1)
SODIUM: 138 meq/L (ref 135–145)
TOTAL PROTEIN: 6.7 g/dL (ref 6.0–8.3)
Total Bilirubin: 0.4 mg/dL (ref 0.2–1.2)

## 2015-01-31 LAB — VITAMIN D 25 HYDROXY (VIT D DEFICIENCY, FRACTURES): VITD: 25.23 ng/mL — ABNORMAL LOW (ref 30.00–100.00)

## 2015-01-31 LAB — TSH: TSH: 0.64 u[IU]/mL (ref 0.35–4.50)

## 2015-03-03 ENCOUNTER — Encounter: Payer: Self-pay | Admitting: Family Medicine

## 2015-03-04 ENCOUNTER — Other Ambulatory Visit: Payer: Self-pay | Admitting: Family Medicine

## 2015-03-04 ENCOUNTER — Other Ambulatory Visit (INDEPENDENT_AMBULATORY_CARE_PROVIDER_SITE_OTHER): Payer: 59

## 2015-03-04 DIAGNOSIS — R8290 Unspecified abnormal findings in urine: Secondary | ICD-10-CM | POA: Diagnosis not present

## 2015-03-04 DIAGNOSIS — N390 Urinary tract infection, site not specified: Secondary | ICD-10-CM

## 2015-03-04 LAB — URINALYSIS, ROUTINE W REFLEX MICROSCOPIC
Bilirubin Urine: NEGATIVE
HGB URINE DIPSTICK: NEGATIVE
KETONES UR: NEGATIVE
LEUKOCYTES UA: NEGATIVE
NITRITE: POSITIVE — AB
Specific Gravity, Urine: 1.02 (ref 1.000–1.030)
TOTAL PROTEIN, URINE-UPE24: NEGATIVE
UROBILINOGEN UA: 0.2 (ref 0.0–1.0)
Urine Glucose: NEGATIVE
pH: 6 (ref 5.0–8.0)

## 2015-03-04 NOTE — Telephone Encounter (Signed)
Reordered urine test at [facility] lab per pt. Request.

## 2015-03-06 ENCOUNTER — Other Ambulatory Visit: Payer: Self-pay | Admitting: Family Medicine

## 2015-03-07 LAB — CULTURE, URINE COMPREHENSIVE: Colony Count: >=100000

## 2015-03-09 ENCOUNTER — Other Ambulatory Visit: Payer: Self-pay | Admitting: Family Medicine

## 2015-03-09 MED ORDER — SULFAMETHOXAZOLE-TRIMETHOPRIM 800-160 MG PO TABS: 1.0000 | ORAL_TABLET | Freq: Two times a day (BID) | ORAL | Status: DC

## 2015-04-16 ENCOUNTER — Encounter: Payer: Self-pay | Admitting: Physician Assistant

## 2015-04-16 ENCOUNTER — Ambulatory Visit (INDEPENDENT_AMBULATORY_CARE_PROVIDER_SITE_OTHER): Payer: 59 | Admitting: Physician Assistant

## 2015-04-16 VITALS — BP 136/92 | HR 91 | Temp 98.3°F | Resp 16 | Ht 63.5 in | Wt 187.0 lb

## 2015-04-16 DIAGNOSIS — J019 Acute sinusitis, unspecified: Secondary | ICD-10-CM | POA: Diagnosis not present

## 2015-04-16 DIAGNOSIS — B9689 Other specified bacterial agents as the cause of diseases classified elsewhere: Secondary | ICD-10-CM | POA: Insufficient documentation

## 2015-04-16 MED ORDER — BENZONATATE 100 MG PO CAPS: 100.0000 mg | ORAL_CAPSULE | Freq: Two times a day (BID) | ORAL | Status: DC | PRN

## 2015-04-16 MED ORDER — SULFAMETHOXAZOLE-TRIMETHOPRIM 800-160 MG PO TABS: 1.0000 | ORAL_TABLET | Freq: Two times a day (BID) | ORAL | Status: DC

## 2015-04-16 NOTE — Progress Notes (Signed)
Patient presents to clinic today c/o 5 days of sinus pressure, sinus pain, dizziness, sore throat and non-productive cough. Endorses some loose stools but denies increased stool frequency, fatigue and body aches. Denies fever,chest pain or SOB. Has been using Cepacol for sore throat.   Past Medical History  Diagnosis Date  . 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 Migraine   . History of kidney stones 2003    onset with pregnacy   . Hyperthyroidism   . History of UTI   . Dental infection 05/28/2011  . Graves disease   . Endometriosis   . IBS (irritable bowel syndrome)   . Vitamin D deficiency   . Syncope     Current Outpatient Prescriptions on File Prior to Visit  Medication Sig Dispense Refill  . Cholecalciferol (VITAMIN D) 1000 UNITS capsule Take 3 capsules (3,000 Units total) by mouth daily.    Marland Kitchen ibuprofen (ADVIL,MOTRIN) 400 MG tablet Take 1 tablet (400 mg total) by mouth 3 (three) times daily as needed. 90 tablet 2  . Magnesium 500 MG TABS Take 500 mg by mouth daily.    . Probiotic Product (ALIGN) 4 MG CAPS Take 1 capsule by mouth daily. 30 capsule 0  . SYNTHROID 175 MCG tablet TAKE 1 TABLET BY MOUTH DAILY BEFORE BREAKFAST 90 tablet PRN  . topiramate (TOPAMAX) 50 MG tablet TAKE 1 TABLET BY MOUTH 2 TIMES DAILY. 60 tablet PRN  . Vitamin D, Ergocalciferol, (DRISDOL) 50000 UNITS CAPS capsule Take 1 capsule (50,000 Units total) by mouth every 7 (seven) days. 4 capsule 3   No current facility-administered medications on file prior to visit.    Allergies  Allergen Reactions  . Amoxicillin Nausea And Vomiting  . Codeine Nausea And Vomiting  . Ultram [Tramadol Hcl] Nausea And Vomiting    Family History  Problem Relation Age of Onset  . Prostate cancer      maternal and paternal grandparents  . Hyperlipidemia Mother   . Hyperlipidemia Maternal Grandmother   . Diabetes Maternal Grandmother   . Kidney disease Maternal Grandmother   . Hyperlipidemia Maternal  Grandfather   . Heart disease Maternal Grandfather 55    MI  . Prostate cancer Maternal Grandfather   . Heart attack Maternal Aunt     deceased age 67  . Hypertension Father   . Dementia Father   . Seizures Father     s/p TBI  . Hypertension Paternal Grandfather   . Graves' disease Sister   . Obesity Brother     Social History   Social History  . Marital Status: Married    Spouse Name: N/A  . Number of Children: 2  . Years of Education: 13   Occupational History  .  [employer]   Social History Main Topics  . Smoking status: Former Smoker    Types: Cigarettes  . Smokeless tobacco: Never Used     Comment: quit one year ago.  . Alcohol Use: 0.6 oz/week    1 Glasses of wine per week  . Drug Use: No  . Sexual Activity:    Partners: Male     Comment: work at [employer], lives with fiance and son   Other Topics Concern  . None   Social History Narrative   Engaged, two sons.   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   No Tob, occ alcohol, no drugs.   Review of Systems - See HPI.  All other ROS are negative.  BP 136/92 mmHg  Pulse 91  Temp(Src) 98.3 F (36.8 C) (Oral)  Resp 16  Ht 5' 3.5" (1.613 m)  Wt 187 lb (84.823 kg)  BMI 32.60 kg/m2  SpO2 99%  Physical Exam  Constitutional: She is oriented to person, place, and time and well-developed, well-nourished, and in no distress.  HENT:  Head: Normocephalic and atraumatic.  Right Ear: Tympanic membrane normal.  Left Ear: Tympanic membrane normal.  Nose: Right sinus exhibits frontal sinus tenderness. Left sinus exhibits frontal sinus tenderness.  Mouth/Throat: Uvula is midline, oropharynx is clear and moist and mucous membranes are normal.  Eyes: Conjunctivae are normal.  Cardiovascular: Normal rate, regular rhythm, normal heart sounds and intact distal pulses.   Pulmonary/Chest: Effort normal and breath sounds normal. No respiratory distress. She has no wheezes. She has no  rales. She exhibits no tenderness.  Neurological: She is alert and oriented to person, place, and time.  Skin: Skin is warm and dry. No rash noted.  Psychiatric: Affect normal.    Recent Results (from the past 2160 hour(s))  Vitamin D (25 hydroxy)     Status: Abnormal   Collection Time: 01/31/15  8:59 AM  Result Value Ref Range   VITD 25.23 (L) 30.00 - 100.00 ng/mL  Comp Met (CMET)     Status: None   Collection Time: 01/31/15  8:59 AM  Result Value Ref Range   Sodium 138 135 - 145 mEq/L   Potassium 4.1 3.5 - 5.1 mEq/L   Chloride 108 96 - 112 mEq/L   CO2 20 19 - 32 mEq/L   Glucose, Bld 88 70 - 99 mg/dL   BUN 12 6 - 23 mg/dL   Creatinine, Ser 0.66 0.40 - 1.20 mg/dL   Total Bilirubin 0.4 0.2 - 1.2 mg/dL   Alkaline Phosphatase 57 39 - 117 U/L   AST 20 0 - 37 U/L   ALT 29 0 - 35 U/L   Total Protein 6.7 6.0 - 8.3 g/dL   Albumin 3.8 3.5 - 5.2 g/dL   Calcium 9.0 8.4 - 10.5 mg/dL   GFR 125.08 >60.00 mL/min  CBC with Differential/Platelet     Status: None   Collection Time: 01/31/15  8:59 AM  Result Value Ref Range   WBC 8.9 4.0 - 10.5 K/uL   RBC 4.53 3.87 - 5.11 Mil/uL   Hemoglobin 14.2 12.0 - 15.0 g/dL   HCT 41.6 36.0 - 46.0 %   MCV 91.9 78.0 - 100.0 fl   MCHC 34.1 30.0 - 36.0 g/dL   RDW 12.7 11.5 - 15.5 %   Platelets 379.0 150.0 - 400.0 K/uL   Neutrophils Relative % 61.6 43.0 - 77.0 %   Lymphocytes Relative 27.9 12.0 - 46.0 %   Monocytes Relative 7.0 3.0 - 12.0 %   Eosinophils Relative 2.5 0.0 - 5.0 %   Basophils Relative 1.0 0.0 - 3.0 %   Neutro Abs 5.5 1.4 - 7.7 K/uL   Lymphs Abs 2.5 0.7 - 4.0 K/uL   Monocytes Absolute 0.6 0.1 - 1.0 K/uL   Eosinophils Absolute 0.2 0.0 - 0.7 K/uL   Basophils Absolute 0.1 0.0 - 0.1 K/uL  TSH     Status: None   Collection Time: 01/31/15  8:59 AM  Result Value Ref Range   TSH 0.64 0.35 - 4.50 uIU/mL  CULTURE, URINE COMPREHENSIVE     Status: None   Collection Time: 03/04/15 11:33 AM  Result Value Ref Range   Culture ESCHERICHIA  COLI     Colony Count >=100,000 COLONIES/ML    Organism ID, Bacteria ESCHERICHIA COLI       Susceptibility   Escherichia coli -  (no method available)    AMPICILLIN >=32 Resistant     AMOX/CLAVULANIC 4 Sensitive     AMPICILLIN/SULBACTAM 16 Intermediate     PIP/TAZO <=4 Sensitive     IMIPENEM <=0.25 Sensitive     CEFTRIAXONE <=1 Sensitive     CEFTAZIDIME <=1 Sensitive     CEFEPIME <=1 Sensitive     GENTAMICIN <=1 Sensitive     TOBRAMYCIN <=1 Sensitive     CIPROFLOXACIN 1 Sensitive     LEVOFLOXACIN 1 Sensitive     NITROFURANTOIN <=16 Sensitive     TRIMETH/SULFA* <=20 Sensitive      * ORAL therapy:A cefazolin MIC of <32 predicts susceptibility to the oral agents cefaclor,cefdinir,cefpodoxime,cefprozil,cefuroxime,cephalexin,and loracarbef when used for therapy of uncomplicated UTIs due to E.coli,K.pneumomiae,and P.mirabilis. PARENTERAL therapy: A cefazolinMIC of >8 indicates resistance to parenteralcefazolin. An alternate test method must beperformed to confirm susceptibility to parenteralcefazolin.  Urinalysis, Routine w reflex microscopic (not at [facility])     Status: Abnormal   Collection Time: 03/04/15 11:33 AM  Result Value Ref Range   Color, Urine YELLOW Yellow;Lt. Yellow   APPearance CLEAR Clear   Specific Gravity, Urine 1.020 1.000-1.030   pH 6.0 5.0 - 8.0   Total Protein, Urine NEGATIVE Negative   Urine Glucose NEGATIVE Negative   Ketones, ur NEGATIVE Negative   Bilirubin Urine NEGATIVE Negative   Hgb urine dipstick NEGATIVE Negative   Urobilinogen, UA 0.2 0.0 - 1.0   Leukocytes, UA NEGATIVE Negative   Nitrite POSITIVE (A) Negative   WBC, UA 0-2/hpf 0-2/hpf   RBC / HPF 0-2/hpf 0-2/hpf   Squamous Epithelial / LPF Rare(0-4/hpf) Rare(0-4/hpf)   Bacteria, UA Many(>50/hpf) (A) None    Assessment/Plan: Acute bacterial sinusitis Rx Bactrim.  Increase fluids.  Rest.  Saline nasal spray.  Probiotic.  Mucinex as directed.  Humidifier in bedroom. Tessalon for cough.  Call or return to clinic  if symptoms are not improving.

## 2015-04-16 NOTE — Patient Instructions (Signed)
Please take antibiotic as directed.  Increase fluid intake.  Use Saline nasal spray.  Take a daily multivitamin. Use Tessalon as directed for cough.  Get some Mucinex-D to take twice daily  Place a humidifier in the bedroom.  Please call or return clinic if symptoms are not improving.  Sinusitis Sinusitis is redness, soreness, and swelling (inflammation) of the paranasal sinuses. Paranasal sinuses are air pockets within the bones of your face (beneath the eyes, the middle of the forehead, or above the eyes). In healthy paranasal sinuses, mucus is able to drain out, and air is able to circulate through them by way of your nose. However, when your paranasal sinuses are inflamed, mucus and air can become trapped. This can allow bacteria and other germs to grow and cause infection. Sinusitis can develop quickly and last only a short time (acute) or continue over a long period (chronic). Sinusitis that lasts for more than 12 weeks is considered chronic.  CAUSES  Causes of sinusitis include:  Allergies.  Structural abnormalities, such as displacement of the cartilage that separates your nostrils (deviated septum), which can decrease the air flow through your nose and sinuses and affect sinus drainage.  Functional abnormalities, such as when the small hairs (cilia) that line your sinuses and help remove mucus do not work properly or are not present. SYMPTOMS  Symptoms of acute and chronic sinusitis are the same. The primary symptoms are pain and pressure around the affected sinuses. Other symptoms include:  Upper toothache.  Earache.  Headache.  Bad breath.  Decreased sense of smell and taste.  A cough, which worsens when you are lying flat.  Fatigue.  Fever.  Thick drainage from your nose, which often is green and may contain pus (purulent).  Swelling and warmth over the affected sinuses. DIAGNOSIS  Your caregiver will perform a physical exam. During the exam, your caregiver  may:  Look in your nose for signs of abnormal growths in your nostrils (nasal polyps).  Tap over the affected sinus to check for signs of infection.  View the inside of your sinuses (endoscopy) with a special imaging device with a light attached (endoscope), which is inserted into your sinuses. If your caregiver suspects that you have chronic sinusitis, one or more of the following tests may be recommended:  Allergy tests.  Nasal culture A sample of mucus is taken from your nose and sent to a lab and screened for bacteria.  Nasal cytology A sample of mucus is taken from your nose and examined by your caregiver to determine if your sinusitis is related to an allergy. TREATMENT  Most cases of acute sinusitis are related to a viral infection and will resolve on their own within 10 days. Sometimes medicines are prescribed to help relieve symptoms (pain medicine, decongestants, nasal steroid sprays, or saline sprays).  However, for sinusitis related to a bacterial infection, your caregiver will prescribe antibiotic medicines. These are medicines that will help kill the bacteria causing the infection.  Rarely, sinusitis is caused by a fungal infection. In theses cases, your caregiver will prescribe antifungal medicine. For some cases of chronic sinusitis, surgery is needed. Generally, these are cases in which sinusitis recurs more than 3 times per year, despite other treatments. HOME CARE INSTRUCTIONS   Drink plenty of water. Water helps thin the mucus so your sinuses can drain more easily.  Use a humidifier.  Inhale steam 3 to 4 times a day (for example, sit in the bathroom with the shower running).  Apply a warm, moist washcloth to your face 3 to 4 times a day, or as directed by your caregiver.  Use saline nasal sprays to help moisten and clean your sinuses.  Take over-the-counter or prescription medicines for pain, discomfort, or fever only as directed by your caregiver. SEEK IMMEDIATE  MEDICAL CARE IF:  You have increasing pain or severe headaches.  You have nausea, vomiting, or drowsiness.  You have swelling around your face.  You have vision problems.  You have a stiff neck.  You have difficulty breathing. MAKE SURE YOU:   Understand these instructions.  Will watch your condition.  Will get help right away if you are not doing well or get worse. Document Released: 07/05/2005 Document Revised: 09/27/2011 Document Reviewed: 07/20/2011 [facility] Patient Information 2014 [place], [place].

## 2015-04-16 NOTE — Assessment & Plan Note (Signed)
Rx Bactrim.  Increase fluids.  Rest.  Saline nasal spray.  Probiotic.  Mucinex as directed.  Humidifier in bedroom. Tessalon for cough.  Call or return to clinic if symptoms are not improving.

## 2015-04-16 NOTE — Progress Notes (Signed)
Pre visit review using our clinic review tool, if applicable. No additional management support is needed unless otherwise documented below in the visit note/SLS  

## 2015-04-28 ENCOUNTER — Encounter: Payer: 59 | Admitting: Family Medicine

## 2015-05-06 ENCOUNTER — Other Ambulatory Visit: Payer: Self-pay | Admitting: Physician Assistant

## 2015-05-06 MED ORDER — FLUCONAZOLE 150 MG PO TABS: ORAL_TABLET | ORAL | Status: DC

## 2015-06-26 ENCOUNTER — Telehealth: Payer: Self-pay | Admitting: Family Medicine

## 2015-06-26 NOTE — Telephone Encounter (Signed)
documented

## 2015-06-26 NOTE — Telephone Encounter (Signed)
Called pt, she says that she had her flu vac here in the office.

## 2015-07-25 ENCOUNTER — Other Ambulatory Visit: Payer: Self-pay | Admitting: Family Medicine

## 2015-07-25 MED ORDER — ALPRAZOLAM 0.25 MG PO TABS: 0.2500 mg | ORAL_TABLET | Freq: Two times a day (BID) | ORAL | Status: DC | PRN

## 2015-07-25 MED FILL — ALPRAZolam 0.25 MG TABS: 0.25 | 10 days supply | Qty: 20 | Fill #0

## 2015-07-28 MED FILL — SYNTHROID 175 MCG TABLET: 175 | 30 days supply | Qty: 30 | Fill #1

## 2015-08-01 ENCOUNTER — Telehealth: Payer: Self-pay | Admitting: Family Medicine

## 2015-08-01 DIAGNOSIS — Z0000 Encounter for general adult medical examination without abnormal findings: Secondary | ICD-10-CM

## 2015-08-01 DIAGNOSIS — E0500 Thyrotoxicosis with diffuse goiter without thyrotoxic crisis or storm: Secondary | ICD-10-CM

## 2015-08-01 DIAGNOSIS — E038 Other specified hypothyroidism: Secondary | ICD-10-CM

## 2015-08-01 NOTE — Telephone Encounter (Signed)
Feel free to schedule her for a PE any time in next month, I can come in early if need be. Labs tsh, free T4, cbc, cmp, lipid

## 2015-08-01 NOTE — Telephone Encounter (Signed)
Labs ordered and patient informed

## 2015-08-01 NOTE — Telephone Encounter (Signed)
Patient would like to have some lab work done.  She is gaining weight and concerned her thyroid is off.  She does need a physical which is due now, she missed in October due to scheduling problem.  (She is employeed here as lab tech).  Advise about labs and also if can be worked in for appt/or change provider to accomodate the need for Physical now?? Extension [phone]

## 2015-08-04 ENCOUNTER — Other Ambulatory Visit (INDEPENDENT_AMBULATORY_CARE_PROVIDER_SITE_OTHER): Payer: 59

## 2015-08-04 DIAGNOSIS — Z0000 Encounter for general adult medical examination without abnormal findings: Secondary | ICD-10-CM

## 2015-08-04 DIAGNOSIS — E0500 Thyrotoxicosis with diffuse goiter without thyrotoxic crisis or storm: Secondary | ICD-10-CM | POA: Diagnosis not present

## 2015-08-04 DIAGNOSIS — E559 Vitamin D deficiency, unspecified: Secondary | ICD-10-CM | POA: Diagnosis not present

## 2015-08-04 DIAGNOSIS — E038 Other specified hypothyroidism: Secondary | ICD-10-CM

## 2015-08-04 LAB — CBC WITH DIFFERENTIAL/PLATELET
BASOS PCT: 0.6 % (ref 0.0–3.0)
Basophils Absolute: 0 10*3/uL (ref 0.0–0.1)
EOS PCT: 2.2 % (ref 0.0–5.0)
Eosinophils Absolute: 0.2 10*3/uL (ref 0.0–0.7)
HCT: 40.2 % (ref 36.0–46.0)
HEMOGLOBIN: 13.4 g/dL (ref 12.0–15.0)
LYMPHS ABS: 1.9 10*3/uL (ref 0.7–4.0)
Lymphocytes Relative: 27.9 % (ref 12.0–46.0)
MCHC: 33.3 g/dL (ref 30.0–36.0)
MCV: 91.8 fl (ref 78.0–100.0)
MONO ABS: 0.4 10*3/uL (ref 0.1–1.0)
MONOS PCT: 6.1 % (ref 3.0–12.0)
Neutro Abs: 4.4 10*3/uL (ref 1.4–7.7)
Neutrophils Relative %: 63.2 % (ref 43.0–77.0)
Platelets: 372 10*3/uL (ref 150.0–400.0)
RBC: 4.38 Mil/uL (ref 3.87–5.11)
RDW: 12.5 % (ref 11.5–15.5)
WBC: 6.9 10*3/uL (ref 4.0–10.5)

## 2015-08-04 LAB — COMPREHENSIVE METABOLIC PANEL
ALBUMIN: 3.7 g/dL (ref 3.5–5.2)
ALT: 34 U/L (ref 0–35)
AST: 20 U/L (ref 0–37)
Alkaline Phosphatase: 67 U/L (ref 39–117)
BILIRUBIN TOTAL: 0.4 mg/dL (ref 0.2–1.2)
BUN: 9 mg/dL (ref 6–23)
CO2: 18 meq/L — AB (ref 19–32)
CREATININE: 0.63 mg/dL (ref 0.40–1.20)
Calcium: 9 mg/dL (ref 8.4–10.5)
Chloride: 108 mEq/L (ref 96–112)
GFR: 131.67 mL/min (ref >60.00)
GLUCOSE: 87 mg/dL (ref 70–99)
Potassium: 3.8 mEq/L (ref 3.5–5.1)
SODIUM: 138 meq/L (ref 135–145)
TOTAL PROTEIN: 6.7 g/dL (ref 6.0–8.3)

## 2015-08-04 LAB — LIPID PANEL
CHOL/HDL RATIO: 4
Cholesterol: 180 mg/dL (ref 0–200)
HDL: 43.5 mg/dL (ref >39.00)
LDL Cholesterol: 107 mg/dL — ABNORMAL HIGH (ref 0–99)
NONHDL: 136.81
Triglycerides: 151 mg/dL — ABNORMAL HIGH (ref 0.0–149.0)
VLDL: 30.2 mg/dL (ref 0.0–40.0)

## 2015-08-04 LAB — T4, FREE: FREE T4: 1.08 ng/dL (ref 0.60–1.60)

## 2015-08-04 LAB — TSH: TSH: 0.87 u[IU]/mL (ref 0.35–4.50)

## 2015-08-04 LAB — VITAMIN D 25 HYDROXY (VIT D DEFICIENCY, FRACTURES): VITD: 28.4 ng/mL — AB (ref 30.00–100.00)

## 2015-08-05 ENCOUNTER — Other Ambulatory Visit: Payer: 59

## 2015-08-08 MED FILL — TOPIRAMATE 50 MG TABLET: 50 | 30 days supply | Qty: 60 | Fill #1

## 2015-09-08 MED FILL — SYNTHROID 175 MCG TABLET: 175 | 30 days supply | Qty: 30 | Fill #2

## 2015-09-15 ENCOUNTER — Ambulatory Visit (INDEPENDENT_AMBULATORY_CARE_PROVIDER_SITE_OTHER): Payer: 59 | Admitting: Adult Health

## 2015-09-15 ENCOUNTER — Encounter: Payer: Self-pay | Admitting: Adult Health

## 2015-09-15 DIAGNOSIS — R42 Dizziness and giddiness: Secondary | ICD-10-CM | POA: Diagnosis not present

## 2015-09-15 MED ORDER — ONDANSETRON HCL 4 MG PO TABS: 4.0000 mg | ORAL_TABLET | Freq: Three times a day (TID) | ORAL | Status: DC | PRN

## 2015-09-15 MED ORDER — MECLIZINE HCL 25 MG PO TABS: 25.0000 mg | ORAL_TABLET | Freq: Three times a day (TID) | ORAL | Status: DC | PRN

## 2015-09-15 MED FILL — MECLIZINE 25 MG TABLET: 25 | 10 days supply | Qty: 30 | Fill #0

## 2015-09-15 MED FILL — ONDANSETRON HCL 4 MG TABLET: 4 | 6 days supply | Qty: 20 | Fill #0

## 2015-09-15 NOTE — Patient Instructions (Signed)
It was great meeting you today  Your exam is consistent with vertigo.   I have sent in a prescription for Meclizine ( dizziness) and Zofran (nausea).   Do the home exercises.   Follow up with PCP if needed.

## 2015-09-15 NOTE — Progress Notes (Signed)
Pre visit review using our clinic review tool, if applicable. No additional management support is needed unless otherwise documented below in the visit note. 

## 2015-09-15 NOTE — Progress Notes (Signed)
Subjective:    Patient ID: Katie Henry, female    DOB: Jan 20, 1971, 45 y.o.   MRN: [id number]  HPI  45 year old female who presents to the office today for dizziness that started this morning " it felt like the room was spinning". When she woke up this morning she felt like the room was spinning, she had trouble walking to the bathroom when she got up out of bed. When she finally made it to the restroom, she started to experience nausea with dry heaves.   She did not go to work today due to the dizziness. She reports that the dizziness has eased up from this morning but she continues to feel nauseated.   She denies fever or vomiting.   Does have a history of migraines and had one yesterday.   Review of Systems  Constitutional: Positive for activity change.  Respiratory: Negative.   Cardiovascular: Negative.   Gastrointestinal: Positive for nausea. Negative for vomiting and diarrhea.  Neurological: Positive for dizziness and headaches. Negative for weakness and light-headedness.  Hematological: Negative.   All other systems reviewed and are negative.  Past Medical History  Diagnosis Date  . 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 Migraine   . History of kidney stones 2003    onset with pregnacy   . Hyperthyroidism   . History of UTI   . Dental infection 05/28/2011  . Graves disease   . Endometriosis   . IBS (irritable bowel syndrome)   . Vitamin D deficiency   . Syncope     Social History   Social History  . Marital Status: Married    Spouse Name: N/A  . Number of Children: 2  . Years of Education: 13   Occupational History  .     Social History Main Topics  . Smoking status: Former Smoker    Types: Cigarettes  . Smokeless tobacco: Never Used     Comment: quit one year ago.  . Alcohol Use: 0.6 oz/week    1 Glasses of wine per week  . Drug Use: No  . Sexual Activity:    Partners: Male     Comment: work at [employer], lives with fiance  and son   Other Topics Concern  . Not on file   Social History Narrative   Engaged, two sons.   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   No Tob, occ alcohol, no drugs.    Past Surgical History  Procedure Laterality Date  . Appendectomy  1991  . Endometrial ablation  01/2007  . Vaginal hysterectomy  2010    ovaries still in place    Family History  Problem Relation Age of Onset  . Prostate cancer      maternal and paternal grandparents  . Hyperlipidemia Mother   . Hyperlipidemia Maternal Grandmother   . Diabetes Maternal Grandmother   . Kidney disease Maternal Grandmother   . Hyperlipidemia Maternal Grandfather   . Heart disease Maternal Grandfather 26    MI  . Prostate cancer Maternal Grandfather   . Heart attack Maternal Aunt     deceased age 24  . Hypertension Father   . Dementia Father   . Seizures Father     s/p TBI  . Hypertension Paternal Grandfather   . Graves' disease Sister   . Obesity Brother     Allergies  Allergen Reactions  . Amoxicillin Nausea And Vomiting  .  Codeine Nausea And Vomiting  . Ultram [Tramadol Hcl] Nausea And Vomiting    Current Outpatient Prescriptions on File Prior to Visit  Medication Sig Dispense Refill  . ALPRAZolam (XANAX) 0.25 MG tablet Take 1 tablet (0.25 mg total) by mouth 2 (two) times daily as needed for anxiety or sleep. 20 tablet 0  . ibuprofen (ADVIL,MOTRIN) 400 MG tablet Take 1 tablet (400 mg total) by mouth 3 (three) times daily as needed. 90 tablet 2  . Magnesium 500 MG TABS Take 500 mg by mouth daily.    . Probiotic Product (ALIGN) 4 MG CAPS Take 1 capsule by mouth daily. 30 capsule 0  . SYNTHROID 175 MCG tablet TAKE 1 TABLET BY MOUTH DAILY BEFORE BREAKFAST 90 tablet PRN  . topiramate (TOPAMAX) 50 MG tablet TAKE 1 TABLET BY MOUTH 2 TIMES DAILY. 60 tablet PRN  . Vitamin D, Ergocalciferol, (DRISDOL) 50000 UNITS CAPS capsule Take 1 capsule (50,000 Units total) by mouth every 7  (seven) days. 4 capsule 3   No current facility-administered medications on file prior to visit.    BP 134/84 mmHg  Temp(Src) 98.4 F (36.9 C) (Oral)  Ht 5' 3.5" (1.613 m)  Wt 195 lb 14.4 oz (88.86 kg)  BMI 34.15 kg/m2       Objective:   Physical Exam  Constitutional: She is oriented to person, place, and time. She appears well-developed and well-nourished. No distress.  HENT:  Head: Normocephalic and atraumatic.  Right Ear: External ear normal.  Left Ear: External ear normal.  Nose: Nose normal.  Mouth/Throat: Oropharynx is clear and moist. No oropharyngeal exudate.  TM's visualized. No cerumen impaction. No signs of infection.   No vertical or horizontal nystagmus.   Eyes: Conjunctivae and EOM are normal. Pupils are equal, round, and reactive to light. Right eye exhibits no discharge. Left eye exhibits no discharge. No scleral icterus.  Neck: Normal range of motion. Neck supple.  Cardiovascular: Normal rate, regular rhythm, normal heart sounds and intact distal pulses.  Exam reveals no gallop.   No murmur heard. Pulmonary/Chest: Effort normal and breath sounds normal. No respiratory distress. She has no wheezes. She has no rales. She exhibits no tenderness.  Lymphadenopathy:    She has no cervical adenopathy.  Neurological: She is alert and oriented to person, place, and time.  Going from sitting to standing position caused dizziness.   Skin: Skin is warm and dry. No rash noted. She is not diaphoretic. No erythema. No pallor.  Psychiatric: She has a normal mood and affect. Her behavior is normal. Judgment and thought content normal.  Nursing note and vitals reviewed.      Assessment & Plan:  1. Dizziness - Likely BPPV. Not concerned for postural hypotension. She is staying hydrated.  - meclizine (ANTIVERT) 25 MG tablet; Take 1 tablet (25 mg total) by mouth 3 (three) times daily as needed for dizziness.  Dispense: 30 tablet; Refill: 0 - ondansetron (ZOFRAN) 4 MG tablet;  Take 1 tablet (4 mg total) by mouth every 8 (eight) hours as needed for nausea or vomiting.  Dispense: 20 tablet; Refill: 0

## 2015-10-17 MED FILL — SYNTHROID 175 MCG TABLET: 175 | 30 days supply | Qty: 30 | Fill #3

## 2015-11-18 MED FILL — SYNTHROID 175 MCG TABLET: 175 | 30 days supply | Qty: 30 | Fill #4

## 2015-11-18 MED FILL — TOPIRAMATE 50 MG TABLET: 50 | 30 days supply | Qty: 60 | Fill #2

## 2015-11-24 ENCOUNTER — Ambulatory Visit (INDEPENDENT_AMBULATORY_CARE_PROVIDER_SITE_OTHER): Payer: 59 | Admitting: Family Medicine

## 2015-11-24 ENCOUNTER — Encounter (HOSPITAL_BASED_OUTPATIENT_CLINIC_OR_DEPARTMENT_OTHER): Payer: Self-pay | Admitting: *Deleted

## 2015-11-24 ENCOUNTER — Encounter: Payer: Self-pay | Admitting: Family Medicine

## 2015-11-24 ENCOUNTER — Emergency Department (HOSPITAL_BASED_OUTPATIENT_CLINIC_OR_DEPARTMENT_OTHER)
Admission: EM | Admit: 2015-11-24 | Discharge: 2015-11-24 | Disposition: A | Discharge status: Home / Self Care | Payer: 59 | Attending: Emergency Medicine | Admitting: Emergency Medicine

## 2015-11-24 VITALS — BP 130/92 | HR 93 | Temp 98.2°F | Ht 64.0 in | Wt 190.0 lb

## 2015-11-24 DIAGNOSIS — R110 Nausea: Secondary | ICD-10-CM | POA: Diagnosis not present

## 2015-11-24 DIAGNOSIS — R42 Dizziness and giddiness: Secondary | ICD-10-CM

## 2015-11-24 DIAGNOSIS — Z791 Long term (current) use of non-steroidal anti-inflammatories (NSAID): Secondary | ICD-10-CM | POA: Insufficient documentation

## 2015-11-24 DIAGNOSIS — Z79899 Other long term (current) drug therapy: Secondary | ICD-10-CM | POA: Insufficient documentation

## 2015-11-24 DIAGNOSIS — Z87891 Personal history of nicotine dependence: Secondary | ICD-10-CM | POA: Insufficient documentation

## 2015-11-24 DIAGNOSIS — E0590 Thyrotoxicosis, unspecified without thyrotoxic crisis or storm: Secondary | ICD-10-CM | POA: Insufficient documentation

## 2015-11-24 LAB — COMPREHENSIVE METABOLIC PANEL
ALBUMIN: 3.8 g/dL (ref 3.5–5.0)
ALT: 50 U/L (ref 14–54)
ANION GAP: 5 (ref 5–15)
AST: 35 U/L (ref 15–41)
Alkaline Phosphatase: 69 U/L (ref 38–126)
BUN: 11 mg/dL (ref 6–20)
CHLORIDE: 110 mmol/L (ref 101–111)
CO2: 20 mmol/L — ABNORMAL LOW (ref 22–32)
Calcium: 9.2 mg/dL (ref 8.9–10.3)
Creatinine, Ser: 0.69 mg/dL (ref 0.44–1.00)
GFR calc Af Amer: >60 mL/min (ref >60)
Glucose, Bld: 98 mg/dL (ref 65–99)
POTASSIUM: 3.8 mmol/L (ref 3.5–5.1)
Sodium: 135 mmol/L (ref 135–145)
Total Bilirubin: 0.5 mg/dL (ref 0.3–1.2)
Total Protein: 7.3 g/dL (ref 6.5–8.1)

## 2015-11-24 LAB — URINALYSIS, ROUTINE W REFLEX MICROSCOPIC
BILIRUBIN URINE: NEGATIVE
GLUCOSE, UA: NEGATIVE mg/dL
Hgb urine dipstick: NEGATIVE
KETONES UR: NEGATIVE mg/dL
LEUKOCYTES UA: NEGATIVE
NITRITE: NEGATIVE
PROTEIN: NEGATIVE mg/dL
Specific Gravity, Urine: 1.009 (ref 1.005–1.030)
pH: 7 (ref 5.0–8.0)

## 2015-11-24 LAB — CBG MONITORING, ED: GLUCOSE-CAPILLARY: 106 mg/dL — AB (ref 65–99)

## 2015-11-24 LAB — CBC
HEMATOCRIT: 43.1 % (ref 36.0–46.0)
HEMOGLOBIN: 14.2 g/dL (ref 12.0–15.0)
MCH: 31.5 pg (ref 26.0–34.0)
MCHC: 32.9 g/dL (ref 30.0–36.0)
MCV: 95.6 fL (ref 78.0–100.0)
Platelets: 339 10*3/uL (ref 150–400)
RBC: 4.51 MIL/uL (ref 3.87–5.11)
RDW: 12.2 % (ref 11.5–15.5)
WBC: 7.8 10*3/uL (ref 4.0–10.5)

## 2015-11-24 LAB — GLUCOSE, POCT (MANUAL RESULT ENTRY): POC Glucose: 123 mg/dl — AB (ref 70–99)

## 2015-11-24 MED ORDER — SODIUM CHLORIDE 0.9 % IV BOLUS (SEPSIS)
1000.0000 mL | Freq: Once | INTRAVENOUS | Status: AC
  Administered 2015-11-24: 1000 mL via INTRAVENOUS

## 2015-11-24 MED ORDER — KETOROLAC TROMETHAMINE 30 MG/ML IJ SOLN
30.0000 mg | Freq: Once | INTRAMUSCULAR | Status: AC
  Administered 2015-11-24: 30 mg via INTRAVENOUS
  Filled 2015-11-24: qty 1

## 2015-11-24 MED ORDER — ONDANSETRON 4 MG PO TBDP: 4.0000 mg | ORAL_TABLET | Freq: Once | ORAL | Status: DC

## 2015-11-24 MED ORDER — METOCLOPRAMIDE HCL 5 MG/ML IJ SOLN
10.0000 mg | Freq: Once | INTRAMUSCULAR | Status: AC
Start: 2015-11-24 — End: 2015-11-24
  Administered 2015-11-24: 10 mg via INTRAVENOUS
  Filled 2015-11-24: qty 2

## 2015-11-24 MED ORDER — MECLIZINE HCL 25 MG PO TABS
25.0000 mg | ORAL_TABLET | Freq: Once | ORAL | Status: AC
  Administered 2015-11-24: 25 mg via ORAL
  Filled 2015-11-24: qty 1

## 2015-11-24 MED ORDER — ONDANSETRON HCL 4 MG/2ML IJ SOLN
4.0000 mg | Freq: Once | INTRAMUSCULAR | Status: AC
  Administered 2015-11-24: 4 mg via INTRAMUSCULAR

## 2015-11-24 NOTE — Progress Notes (Signed)
[place] at [facility] [address], [place], [place] [address] [phone]  Date:  11/24/2015   Name:  Katie Henry   DOB:  12/16/1970   MRN:  [id number]  PCP:  Penni Homans, MD    Chief Complaint: Dizziness   History of Present Illness:  Katie Henry is a 45 y.o. very pleasant female patient who presents with the following:  Here today with acute illness-  History off fainting (seen by neurology), hyperthyroidism, IBS.   She felt fine this am- then when she arrived at work at 0800 she had sudden onset of dizziness.  She walked into the building but then felt really dizzy and had to get help sitting down.  Brought upstairs by wheelchair.    No headache. Notes that the back of her neck feels "uncomfortable" and her head feels heavy.  She has noted that "my eyes have been bothering me" with what sounds like an aura. She has noted this a couple of times over the last few weeks.  However this is not new to her- has occurred on occasion over the last few years.   She does use topamax for migraine prevention No vomiting, but she does feel nauseated.   No tinnitus, no buzzing or ringing in her ears.  She feels lightheaded- not vertigo or spinning.   She did eat this am, normal diet No numbness, weakness or tingling in her body. No difficulty moving any of her body parts.  No SOB or CP  She was admitted to [facility] 5-6 years ago for similar sx- unfortunalty I cannot see all of these records.  It does appear that she had a cardiac cath and a tilt table test. She notes that she had several syncopal episodes and was admitted for a week.     Patient Active Problem List   Diagnosis Date Noted  . Acute bacterial sinusitis 04/16/2015  . Preventative health care 10/14/2013  . Graves disease   . Endometriosis   . IBS (irritable bowel syndrome)   . Syncope   . Heel pain 07/17/2012  . Migraines 01/13/2012  . Kidney stones 01/13/2012  .  Breast cancer screening 07/08/2011  . Vitamin D deficiency 05/23/2011  . Hypothyroidism 05/23/2011    Past Medical History  Diagnosis Date  . 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 Migraine   . History of kidney stones 2003    onset with pregnacy   . Hyperthyroidism   . History of UTI   . Dental infection 05/28/2011  . Graves disease   . Endometriosis   . IBS (irritable bowel syndrome)   . Vitamin D deficiency   . Syncope     Past Surgical History  Procedure Laterality Date  . Appendectomy  1991  . Endometrial ablation  01/2007  . Vaginal hysterectomy  2010    ovaries still in place    Social History  Substance Use Topics  . Smoking status: Former Smoker    Types: Cigarettes  . Smokeless tobacco: Never Used     Comment: quit one year ago.  . Alcohol Use: 0.6 oz/week    1 Glasses of wine per week    Family History  Problem Relation Age of Onset  . Prostate cancer      maternal and paternal grandparents  . Hyperlipidemia Mother   . Hyperlipidemia Maternal Grandmother   . Diabetes Maternal Grandmother   . Kidney  disease Maternal Grandmother   . Hyperlipidemia Maternal Grandfather   . Heart disease Maternal Grandfather 11    MI  . Prostate cancer Maternal Grandfather   . Heart attack Maternal Aunt     deceased age 74  . Hypertension Father   . Dementia Father   . Seizures Father     s/p TBI  . Hypertension Paternal Grandfather   . Graves' disease Sister   . Obesity Brother     Allergies  Allergen Reactions  . Amoxicillin Nausea And Vomiting  . Codeine Nausea And Vomiting  . Ultram [Tramadol Hcl] Nausea And Vomiting    Medication list has been reviewed and updated.  Current Outpatient Prescriptions on File Prior to Visit  Medication Sig Dispense Refill  . ALPRAZolam (XANAX) 0.25 MG tablet Take 1 tablet (0.25 mg total) by mouth 2 (two) times daily as needed for anxiety or sleep. 20 tablet 0  . ibuprofen (ADVIL,MOTRIN) 400 MG tablet Take 1  tablet (400 mg total) by mouth 3 (three) times daily as needed. 90 tablet 2  . Magnesium 500 MG TABS Take 500 mg by mouth daily.    . meclizine (ANTIVERT) 25 MG tablet Take 1 tablet (25 mg total) by mouth 3 (three) times daily as needed for dizziness. 30 tablet 0  . ondansetron (ZOFRAN) 4 MG tablet Take 1 tablet (4 mg total) by mouth every 8 (eight) hours as needed for nausea or vomiting. 20 tablet 0  . Probiotic Product (ALIGN) 4 MG CAPS Take 1 capsule by mouth daily. 30 capsule 0  . SYNTHROID 175 MCG tablet TAKE 1 TABLET BY MOUTH DAILY BEFORE BREAKFAST 90 tablet PRN  . topiramate (TOPAMAX) 50 MG tablet TAKE 1 TABLET BY MOUTH 2 TIMES DAILY. 60 tablet PRN  . Vitamin D, Ergocalciferol, (DRISDOL) 50000 UNITS CAPS capsule Take 1 capsule (50,000 Units total) by mouth every 7 (seven) days. 4 capsule 3   No current facility-administered medications on file prior to visit.    Review of Systems:  As per HPI- otherwise negative.   Physical Examination: Filed Vitals:   11/24/15 0837  BP: 130/92  Pulse: 93  Temp: 98.2 F (36.8 C)   Filed Vitals:   11/24/15 0837  Height: 5\' 4"  (1.626 m)  Weight: 190 lb (86.183 kg)   Body mass index is 32.6 kg/(m^2). Ideal Body Weight: Weight in (lb) to have BMI = 25: 145.3  GEN: WDWN, NAD, Non-toxic, A & O x 3, looks ill.  Sitting very still HEENT: Atraumatic, Normocephalic. Neck supple. No masses, No LAD. Bilateral TM wnl, oropharynx normal.  PEERL,EOMI.   No meningismus Ears and Nose: No external deformity. CV: RRR, No M/G/R. No JVD. No thrill. No extra heart sounds. PULM: CTA B, no wheezes, crackles, rhonchi. No retractions. No resp. distress. No accessory muscle use.Marland Kitchen EXTR: No c/c/e NEURO not able to stand unassisted PSYCH: Normally interactive. Conversant. Not depressed or anxious appearing.  Calm demeanor.   Given 4mg  of IM zofran and rested for about on hour. Symptoms did not improve Results for orders placed or performed in visit on 11/24/15   POCT glucose (manual entry)  Result Value Ref Range   POC Glucose 123 (A) 70 - 99 mg/dl    Not given PO zofran- just IM Assessment and Plan: Dizziness and giddiness - Plan: POCT glucose (manual entry), ondansetron (ZOFRAN) injection 4 mg, DISCONTINUED: ondansetron (ZOFRAN-ODT) disintegrating tablet 4 mg  Nausea without vomiting - Plan: ondansetron (ZOFRAN) injection 4 mg  Here today with severe  dizziness- not vertigo per report, feels lightheaded. History of several syncopal episodes in the past s/p thorough work-up. Pt is not aware of any particular dx at that time.  Referral to ER for further evaluation    Signed Lamar Blinks, MD

## 2015-11-24 NOTE — ED Provider Notes (Signed)
CSN: [id number]     Arrival date & time 11/24/15  0935 History   First MD Initiated Contact with Patient 11/24/15 [phone]     Chief Complaint  Patient presents with  . Dizziness     (Consider location/radiation/quality/duration/timing/severity/associated sxs/prior Treatment) HPI  45 year old female, she persisted today with a complaint of acute onset of dizziness. 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 She reports that when she arrived in her car she felt acute onset of dizziness, she felt off balance, felt a strange heavy feeling in her head, she had a feeling like she could not ambulate, there was definitely a difference with head position, she felt near syncopal but did not pass out. This gets worse with standing. It is not associated with any chest pain shortness of breath burning with urination and she denies pregnancy. She has not had a recent head injury. She does report that approximately 7 or 8 years ago she had a very thorough workup for similar symptoms that was done at [facility] including an MRI and a tilt table test. Blood sugar was checked prehospital and confirmed on arrival at just over 100. During her past workup it was thought that she had a migraine type disorder causing her symptoms, she does report over the last month she has had intermittent headaches and visual auras consistent with a history of migraines.  Past Medical History  Diagnosis Date  . 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 Migraine   . History of kidney stones 2003    onset with pregnacy   . Hyperthyroidism   . History of UTI   . Dental infection 05/28/2011  . Graves disease   . Endometriosis   . IBS (irritable bowel syndrome)   . Vitamin D deficiency   . Syncope    Past Surgical History  Procedure Laterality Date  . Appendectomy  1991   . Endometrial ablation  01/2007  . Vaginal hysterectomy  2010    ovaries still in place  . Thyroid surgery     Family History  Problem Relation Age of Onset  . Prostate cancer      maternal and paternal grandparents  . Hyperlipidemia Mother   . Hyperlipidemia Maternal Grandmother   . Diabetes Maternal Grandmother   . Kidney disease Maternal Grandmother   . Hyperlipidemia Maternal Grandfather   . Heart disease Maternal Grandfather 65    MI  . Prostate cancer Maternal Grandfather   . Heart attack Maternal Aunt     deceased age 76  . Hypertension Father   . Dementia Father   . Seizures Father     s/p TBI  . Hypertension Paternal Grandfather   . Graves' disease Sister   . Obesity Brother    Social History  Substance Use Topics  . Smoking status: Former Smoker    Types: Cigarettes  . Smokeless tobacco: Never Used     Comment: quit one year ago.  . Alcohol Use: 0.6 oz/week    1 Glasses of wine per week   OB History    No data available     Review of Systems  All other systems reviewed and are negative.     Allergies  Amoxicillin; Codeine; and Ultram  Home Medications   Prior to Admission medications   Medication Sig Start Date  End Date Taking? Authorizing Provider  ALPRAZolam (XANAX) 0.25 MG tablet Take 1 tablet (0.25 mg total) by mouth 2 (two) times daily as needed for anxiety or sleep. 07/25/15  Yes [name], MD  ibuprofen (ADVIL,MOTRIN) 400 MG tablet Take 1 tablet (400 mg total) by mouth 3 (three) times daily as needed. 01/15/14  Yes [name], MD  Magnesium 500 MG TABS Take 500 mg by mouth daily.   Yes Historical Provider, MD  meclizine (ANTIVERT) 25 MG tablet Take 1 tablet (25 mg total) by mouth 3 (three) times daily as needed for dizziness. 09/15/15  Yes Dorothyann Peng, NP  ondansetron (ZOFRAN) 4 MG tablet Take 1 tablet (4 mg total) by mouth every 8 (eight) hours as needed for nausea or vomiting. 09/15/15  Yes Dorothyann Peng, NP  Probiotic Product  (ALIGN) 4 MG CAPS Take 1 capsule by mouth daily. 05/28/11  Yes [name], MD  SYNTHROID 175 MCG tablet TAKE 1 TABLET BY MOUTH DAILY BEFORE BREAKFAST 03/06/15  Yes [name], MD  topiramate (TOPAMAX) 50 MG tablet TAKE 1 TABLET BY MOUTH 2 TIMES DAILY. 03/06/15  Yes [name], MD  Vitamin D, Ergocalciferol, (DRISDOL) 50000 UNITS CAPS capsule Take 1 capsule (50,000 Units total) by mouth every 7 (seven) days. 10/18/14  Yes [name], MD   BP 129/85 mmHg  Pulse 79  Temp(Src) 98.7 F (37.1 C) (Oral)  Resp 21  SpO2 100% Physical Exam  Constitutional: She appears well-developed and well-nourished. No distress.  HENT:  Head: Normocephalic and atraumatic.  Mouth/Throat: Oropharynx is clear and moist. No oropharyngeal exudate.  Eyes: Conjunctivae and EOM are normal. Pupils are equal, round, and reactive to light. Right eye exhibits no discharge. Left eye exhibits no discharge. No scleral icterus.  Neck: Normal range of motion. Neck supple. No JVD present. No thyromegaly present.  Cardiovascular: Normal rate, regular rhythm, normal heart sounds and intact distal pulses.  Exam reveals no gallop and no friction rub.   No murmur heard. Pulmonary/Chest: Effort normal and breath sounds normal. No respiratory distress. She has no wheezes. She has no rales.  Abdominal: Soft. Bowel sounds are normal. She exhibits no distension and no mass. There is no tenderness.  Musculoskeletal: Normal range of motion. She exhibits no edema or tenderness.  Lymphadenopathy:    She has no cervical adenopathy.  Neurological: She is alert. Coordination normal.  Normal level of alertness, clear speech, normal coordination by finger-nose-finger, no pronator drift, normal strength and sensation in all 4 extremities at all major muscle groups, normal pulses at the feet. The patient has some inducible dizziness which she describes as room spinning with head change in position from both sitting and laying down turning  her head to the right.  Skin: Skin is warm and dry. No rash noted. No erythema.  Psychiatric: She has a normal mood and affect. Her behavior is normal.  Nursing note and vitals reviewed.   ED Course  Procedures (including critical care time) Labs Review Labs Reviewed  URINALYSIS, ROUTINE W REFLEX MICROSCOPIC (NOT AT [facility]) - Abnormal; Notable for the following:    APPearance CLOUDY (*)    All other components within normal limits  COMPREHENSIVE METABOLIC PANEL - Abnormal; Notable for the following:    CO2 20 (*)    All other components within normal limits  CBG MONITORING, ED - Abnormal; Notable for the following:    Glucose-Capillary 106 (*)    All other components within normal limits  CBC  CBG MONITORING, ED    Imaging Review No results found. I have personally reviewed and evaluated these images and lab results as part of my medical decision-making.   EKG Interpretation   Date/Time:  Monday Nov 24 2015 09:56:41 EDT Ventricular Rate:  75 PR Interval:  139 QRS Duration: 91 QT Interval:  379 QTC Calculation: 423 R Axis:   89 Text Interpretation:  Sinus rhythm Baseline wander in lead(s) V5 V6 since  last tracing no significant change Confirmed by Jo Booze  MD, Larwence Tu ([id number])  on 11/24/2015 10:15:13 AM Also confirmed by Sabra Heck  MD, [place] ([id number]),  editor Rolla Plate, Joelene Millin [phone])  on 11/24/2015 11:09:29 AM      MDM   Final diagnoses:  Dizziness    The patient's exam is unremarkable other than her inducible neurologic symptoms with change in position. I suspect that this illness is a combination of either migraine headaches or peripheral vertigo. This does not appear to be central vertigo, it is not a constant sensation but more positional. We'll give some medications medications and some fluids. She has had history of migraine type symptoms with auras intermittently this month, neuro imaging not indicated at this time, EKG unremarkable  Improved with meds and fluids - still  has positional vertigo but in sitting position has no sx.  Rest, fluids, VS normal  Pt informed of results and in agreement with plan.  Noemi Chapel, MD 11/24/15 [phone]

## 2015-11-24 NOTE — ED Notes (Signed)
Pt requested gingerale and crackers. MD aware. Gingerale and crackers given.

## 2015-11-24 NOTE — ED Notes (Signed)
Pt assisted to restroom via wheelchair

## 2015-11-24 NOTE — ED Notes (Signed)
Pt works at [employer] office at [employer] she felt dizzy when she arrived at work this morning- Reports feeling off balance "like I was going to pass out"- received IM zofran 4mg  upstairs prior to coming to ED for evaluation. Pt cao x 4 at this time. Taken to room 5 to complete triage

## 2015-11-24 NOTE — Discharge Instructions (Signed)

## 2015-11-24 NOTE — Progress Notes (Signed)
Pre visit review using our clinic tool,if applicable. No additional management support is needed unless otherwise documented below in the visit note.  

## 2015-12-03 ENCOUNTER — Other Ambulatory Visit: Payer: Self-pay | Admitting: Family Medicine

## 2015-12-03 DIAGNOSIS — Z1231 Encounter for screening mammogram for malignant neoplasm of breast: Secondary | ICD-10-CM

## 2015-12-09 ENCOUNTER — Ambulatory Visit (HOSPITAL_BASED_OUTPATIENT_CLINIC_OR_DEPARTMENT_OTHER)
Admission: RE | Admit: 2015-12-09 | Discharge: 2015-12-09 | Disposition: A | Discharge status: Home / Self Care | Payer: 59 | Source: Ambulatory Visit | Attending: Family Medicine | Admitting: Family Medicine

## 2015-12-09 DIAGNOSIS — Z1231 Encounter for screening mammogram for malignant neoplasm of breast: Secondary | ICD-10-CM | POA: Diagnosis not present

## 2016-01-26 MED FILL — TOPIRAMATE 50 MG TABLET: 50 | 30 days supply | Qty: 60 | Fill #3

## 2016-01-26 MED FILL — SYNTHROID 175 MCG TABLET: 175 | 30 days supply | Qty: 30 | Fill #5

## 2016-03-04 MED FILL — SYNTHROID 175 MCG TABLET: 175 | 30 days supply | Qty: 30 | Fill #6

## 2016-03-04 MED FILL — TOPIRAMATE 50 MG TABLET: 50 | 30 days supply | Qty: 60 | Fill #4

## 2016-04-13 MED FILL — SYNTHROID 175 MCG TABLET: 175 | 30 days supply | Qty: 30 | Fill #7

## 2016-05-20 ENCOUNTER — Other Ambulatory Visit: Payer: Self-pay | Admitting: Family Medicine

## 2016-05-20 MED FILL — TOPIRAMATE 50 MG TABLET: 50 | 30 days supply | Qty: 60 | Fill #0

## 2016-05-20 MED FILL — SYNTHROID 175 MCG TABLET: 175 | 30 days supply | Qty: 30 | Fill #8

## 2016-05-31 ENCOUNTER — Encounter: Payer: Self-pay | Admitting: Family Medicine

## 2016-05-31 ENCOUNTER — Ambulatory Visit (INDEPENDENT_AMBULATORY_CARE_PROVIDER_SITE_OTHER): Payer: 59 | Admitting: Family Medicine

## 2016-05-31 VITALS — BP 110/68 | HR 84 | Temp 98.2°F | Ht 64.0 in

## 2016-05-31 DIAGNOSIS — R319 Hematuria, unspecified: Secondary | ICD-10-CM | POA: Diagnosis not present

## 2016-05-31 DIAGNOSIS — N3001 Acute cystitis with hematuria: Secondary | ICD-10-CM

## 2016-05-31 DIAGNOSIS — R8290 Unspecified abnormal findings in urine: Secondary | ICD-10-CM

## 2016-05-31 LAB — POC URINALSYSI DIPSTICK (AUTOMATED)
BILIRUBIN UA: NEGATIVE
Glucose, UA: NEGATIVE
KETONES UA: NEGATIVE
Leukocytes, UA: NEGATIVE
Nitrite, UA: NEGATIVE
Protein, UA: NEGATIVE
UROBILINOGEN UA: 0.2
pH, UA: 5.5

## 2016-05-31 MED ORDER — CIPROFLOXACIN HCL 250 MG PO TABS: 250.0000 mg | ORAL_TABLET | Freq: Two times a day (BID) | ORAL | 0 refills | Status: DC

## 2016-05-31 MED FILL — CIPROFLOXACIN HCL 250 MG TA: 250 | 3 days supply | Qty: 6 | Fill #0

## 2016-05-31 NOTE — Progress Notes (Signed)
Chief Complaint  Patient presents with  . Cystitis    pressure and sting in the bladder, alittle abd pain-sxs started on Friday and got worse on yest.    Katie Henry is a 45 y.o. female here for possible UTI.  Duration: 3 days. Symptoms: pain with urination, pressure, some abdominal pain Denies: urinary frequency, hematuria, urinary retention and fever, vaginal discharge Hx of recurrent UTI? No Denies new sexual partners.  ROS:  Constitutional: denies fever GU: As noted in HPI MSK: Denies back pain Abd: Denies constipation  Past Medical History:  Diagnosis Date  . Dental infection 05/28/2011  . Endometriosis   . Fainting episodes    Dr Luther Parody related  . Graves disease   . History of kidney stones 2003   onset with pregnacy   . History of UTI   . Hyperthyroidism   . IBS (irritable bowel syndrome)   . Migraine   . Syncope   . Vitamin D deficiency    Family History  Problem Relation Age of Onset  . Prostate cancer      maternal and paternal grandparents  . Hyperlipidemia Mother   . Hyperlipidemia Maternal Grandmother   . Diabetes Maternal Grandmother   . Kidney disease Maternal Grandmother   . Hyperlipidemia Maternal Grandfather   . Heart disease Maternal Grandfather 40    MI  . Prostate cancer Maternal Grandfather   . Heart attack Maternal Aunt     deceased age 66  . Hypertension Father   . Dementia Father   . Seizures Father     s/p TBI  . Hypertension Paternal Grandfather   . Graves' disease Sister   . Obesity Brother    Social History   Social History  . Marital status: Married  . Number of children: 2  . Years of education: 13   Occupational History  .  [employer]   Social History Main Topics  . Smoking status: Former Smoker    Types: Cigarettes  . Smokeless tobacco: Never Used     Comment: quit one year ago.  . Alcohol use 0.6 oz/week    1 Glasses of wine per week  . Drug use: No  . Sexual activity: Yes    Partners: Male   Comment: work at [employer], lives with fiance and son   Social History Narrative   Engaged, two sons.   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   No Tob, occ alcohol, no drugs.    BP 110/68 (BP Location: Left Arm, Patient Position: Sitting, Cuff Size: Normal)   Pulse 84   Temp 98.2 F (36.8 C) (Oral)   Ht 5\' 4"  (1.626 m)   SpO2 98%  General: Awake, alert, appears stated age 75: MMM Heart: RRR, no murmurs Lungs: CTAB, normal respiratory effort, no accessory muscle usage Abd: BS+, soft, mild TTP over the suprapubic region, ND, no masses or organomegaly MSK: No CVA tenderness, neg Lloyd's sign Psych: Age appropriate judgment and insight  Acute cystitis with hematuria - Plan: ciprofloxacin (CIPRO) 250 MG tablet  Hematuria, unspecified type - Plan: POCT Urinalysis Dipstick (Automated)  Urine abnormality - Plan: POCT Urinalysis Dipstick (Automated)  Orders as above. UA neg w exception of blood. Pt has had UTIs in past and states this is similar presentation. F/u prn. The patient voiced understanding and agreement to the plan.  [place], DO 05/31/16 3:22 PM

## 2016-05-31 NOTE — Progress Notes (Signed)
Pre visit review using our clinic review tool, if applicable. No additional management support is needed unless otherwise documented below in the visit note. 

## 2016-06-21 ENCOUNTER — Encounter: Payer: Self-pay | Admitting: Family Medicine

## 2016-06-21 ENCOUNTER — Ambulatory Visit (INDEPENDENT_AMBULATORY_CARE_PROVIDER_SITE_OTHER): Payer: 59 | Admitting: Family Medicine

## 2016-06-21 VITALS — BP 126/86 | HR 60 | Temp 98.4°F

## 2016-06-21 DIAGNOSIS — J0100 Acute maxillary sinusitis, unspecified: Secondary | ICD-10-CM | POA: Diagnosis not present

## 2016-06-21 MED ORDER — FLUTICASONE PROPIONATE 50 MCG/ACT NA SUSP: 2.0000 | Freq: Every day | NASAL | 6 refills | Status: DC

## 2016-06-21 MED ORDER — CETIRIZINE HCL 10 MG PO TABS: 10.0000 mg | ORAL_TABLET | Freq: Every day | ORAL | 0 refills | Status: DC

## 2016-06-21 MED FILL — ALL DAY ALLERGY 10 MG TAB: 10 | 100 days supply | Qty: 100 | Fill #0

## 2016-06-21 MED FILL — FLUTICASONE PROP 50 MCG SPR: 50 | 30 days supply | Qty: 16 | Fill #0

## 2016-06-21 NOTE — Patient Instructions (Addendum)
Let me know Thursday or Friday if you are getting worse/not getting better.  Get an air humidifier for your room to help with the dryness in your nose. Vaseline or antibiotic ointment (to keep in moisture) if you start having nosebleeds.  Flonase- 2 sprays each nostril every day. Aim towards the same side eye to help avoid nose bleeds.  As far as other over the counter remedies, you can try any you wish, but stop using if they are not helpful.   Keep practice good hand hygiene, cover your mouth when you cough, and push fluids.

## 2016-06-21 NOTE — Progress Notes (Signed)
Pre visit review using our clinic review tool, if applicable. No additional management support is needed unless otherwise documented below in the visit note. 

## 2016-06-21 NOTE — Progress Notes (Signed)
Chief Complaint  Patient presents with  . Sinusitis    Katie Henry here for URI complaints.  Duration: 6 days  Associated symptoms: sinus headache, sinus congestion, rhinorrhea and cough Denies: subjective fever, sinus pain, itchy watery eyes, ear pain, ear drainage, sore throat, shortness of breath and chest tightness Treatment to date: Nothing Sick contacts: No  ROS:  Const: Denies fevers HEENT: As noted in HPI Lungs: No SOB  Past Medical History:  Diagnosis Date  . Dental infection 05/28/2011  . Endometriosis   . Fainting episodes    Dr Luther Parody related  . Graves disease   . History of kidney stones 2003   onset with pregnacy   . History of UTI   . Hyperthyroidism   . IBS (irritable bowel syndrome)   . Migraine   . Syncope   . Vitamin D deficiency    Family History  Problem Relation Age of Onset  . Prostate cancer      maternal and paternal grandparents  . Hyperlipidemia Mother   . Hyperlipidemia Maternal Grandmother   . Diabetes Maternal Grandmother   . Kidney disease Maternal Grandmother   . Hyperlipidemia Maternal Grandfather   . Heart disease Maternal Grandfather 20    MI  . Prostate cancer Maternal Grandfather   . Heart attack Maternal Aunt     deceased age 44  . Hypertension Father   . Dementia Father   . Seizures Father     s/p TBI  . Hypertension Paternal Grandfather   . Graves' disease Sister   . Obesity Brother     BP 126/86 (BP Location: Left Arm, Patient Position: Sitting, Cuff Size: Small)   Pulse 60   Temp 98.4 F (36.9 C) (Oral)   SpO2 98%  General: Awake, alert, appears stated age HEENT: AT, [place], ears patent b/l and TM's neg, nares patent w/o discharge, pharynx pink and without exudates, MMM Neck: No masses or asymmetry Heart: RRR, no murmurs, no bruits Lungs: CTAB, no accessory muscle use Psych: Age appropriate judgment and insight, normal mood and affect  Acute maxillary sinusitis, recurrence not specified - Plan:  fluticasone (FLONASE) 50 MCG/ACT nasal spray, cetirizine (ZYRTEC) 10 MG tablet  Orders as above. Likely viral.  Pt to let us know if things are not improving, will call in abx at that time. F/u prn otherwise. Pt voiced understanding and agreement to the plan.  [place], DO 06/21/16 12:18 PM

## 2016-06-23 ENCOUNTER — Encounter (HOSPITAL_COMMUNITY): Payer: Self-pay | Admitting: Emergency Medicine

## 2016-06-23 ENCOUNTER — Ambulatory Visit (HOSPITAL_COMMUNITY)
Admission: EM | Admit: 2016-06-23 | Discharge: 2016-06-23 | Disposition: A | Discharge status: Home / Self Care | Payer: 59 | Attending: Emergency Medicine | Admitting: Emergency Medicine

## 2016-06-23 DIAGNOSIS — Z885 Allergy status to narcotic agent status: Secondary | ICD-10-CM | POA: Insufficient documentation

## 2016-06-23 DIAGNOSIS — B373 Candidiasis of vulva and vagina: Secondary | ICD-10-CM | POA: Diagnosis not present

## 2016-06-23 DIAGNOSIS — Z888 Allergy status to other drugs, medicaments and biological substances status: Secondary | ICD-10-CM | POA: Insufficient documentation

## 2016-06-23 DIAGNOSIS — N898 Other specified noninflammatory disorders of vagina: Secondary | ICD-10-CM | POA: Diagnosis present

## 2016-06-23 DIAGNOSIS — Z87442 Personal history of urinary calculi: Secondary | ICD-10-CM | POA: Diagnosis not present

## 2016-06-23 DIAGNOSIS — Z881 Allergy status to other antibiotic agents status: Secondary | ICD-10-CM | POA: Diagnosis not present

## 2016-06-23 DIAGNOSIS — Z7951 Long term (current) use of inhaled steroids: Secondary | ICD-10-CM | POA: Insufficient documentation

## 2016-06-23 DIAGNOSIS — Z87891 Personal history of nicotine dependence: Secondary | ICD-10-CM | POA: Diagnosis not present

## 2016-06-23 DIAGNOSIS — Z79899 Other long term (current) drug therapy: Secondary | ICD-10-CM | POA: Insufficient documentation

## 2016-06-23 DIAGNOSIS — B3731 Acute candidiasis of vulva and vagina: Secondary | ICD-10-CM

## 2016-06-23 MED ORDER — FLUCONAZOLE 150 MG PO TABS: ORAL_TABLET | ORAL | 0 refills | Status: DC

## 2016-06-23 MED FILL — FLUCONAZOLE 150 MG TABLET: 150 | 3 days supply | Qty: 2 | Fill #0

## 2016-06-23 NOTE — ED Provider Notes (Signed)
CSN: [id number]     Arrival date & time 06/23/16  1026 History   None    Chief Complaint  Patient presents with  . Vaginal Discharge    HPI Katie Henry is a 45 yo female who presents today with vaginal discharge. She reports she was on Ciprofloxacin recently for UTI. She completed this on soon after thanksgiving weekend. 3-4 days after completing antibiotic, she noticed creamy discharge with itching in the vaginal region. This has been going on for about 1.5 weeks. Her UTI symptoms have now resolved. Denies dysuria, increased frequency of urination, fevers, chills. Denies abdominal pain or pelvic pain. Is married and is sexually active with her husband. Denies any concern for STI. No past history of yeast infections.    Past Medical History:  Diagnosis Date  . Dental infection 05/28/2011  . Endometriosis   . Fainting episodes    Dr Luther Parody related  . Graves disease   . History of kidney stones 2003   onset with pregnacy   . History of UTI   . Hyperthyroidism   . IBS (irritable bowel syndrome)   . Migraine   . Syncope   . Vitamin D deficiency    Past Surgical History:  Procedure Laterality Date  . APPENDECTOMY  1991  . ENDOMETRIAL ABLATION  01/2007  . THYROID SURGERY    . VAGINAL HYSTERECTOMY  2010   ovaries still in place   Family History  Problem Relation Age of Onset  . Hyperlipidemia Mother   . Hyperlipidemia Maternal Grandmother   . Diabetes Maternal Grandmother   . Kidney disease Maternal Grandmother   . Hyperlipidemia Maternal Grandfather   . Heart disease Maternal Grandfather 8    MI  . Prostate cancer Maternal Grandfather   . Hypertension Father   . Dementia Father   . Seizures Father     s/p TBI  . Hypertension Paternal Grandfather   . Graves' disease Sister   . Obesity Brother   . Prostate cancer      maternal and paternal grandparents  . Heart attack Maternal Aunt     deceased age 65   Social History  Substance Use Topics  . Smoking status:  Former Smoker    Types: Cigarettes  . Smokeless tobacco: Never Used     Comment: quit one year ago.  . Alcohol use 0.6 oz/week    1 Glasses of wine per week   OB History    No data available     Review of Systems  Allergies  Amoxicillin; Codeine; and Ultram [tramadol hcl]  Home Medications   Prior to Admission medications   Medication Sig Start Date End Date Taking? Authorizing Provider  cetirizine (ZYRTEC) 10 MG tablet Take 1 tablet (10 mg total) by mouth daily. 06/21/16  Yes Crosby Oyster Wendling, DO  fluticasone (FLONASE) 50 MCG/ACT nasal spray Place 2 sprays into both nostrils daily. 06/21/16  Yes Crosby Oyster Wendling, DO  SYNTHROID 175 MCG tablet TAKE 1 TABLET BY MOUTH DAILY BEFORE BREAKFAST 03/06/15  Yes [name], MD  topiramate (TOPAMAX) 50 MG tablet TAKE 1 TABLET BY MOUTH 2 TIMES DAILY. 05/20/16  Yes [name], MD  Vitamin D, Ergocalciferol, (DRISDOL) 50000 UNITS CAPS capsule Take 1 capsule (50,000 Units total) by mouth every 7 (seven) days. 10/18/14  Yes [name], MD  ALPRAZolam Duanne Moron) 0.25 MG tablet Take 1 tablet (0.25 mg total) by mouth 2 (two) times daily as needed for anxiety or sleep. 07/25/15   Erline Levine  Eric Form, MD  fluconazole (DIFLUCAN) 150 MG tablet Take one tablet once. If symptoms are persistent after 3 days then take the second tablet . 06/23/16   Smiley Houseman, MD  ibuprofen (ADVIL,MOTRIN) 400 MG tablet Take 1 tablet (400 mg total) by mouth 3 (three) times daily as needed. 01/15/14   [name], MD  Magnesium 500 MG TABS Take 500 mg by mouth daily.    Historical Provider, MD  Probiotic Product (ALIGN) 4 MG CAPS Take 1 capsule by mouth daily. 05/28/11   [name], MD   Meds Ordered and Administered this Visit  Medications - No data to display  BP 134/96 (BP Location: Right Arm)   Pulse 91   Temp 98.8 F (37.1 C) (Oral)   Resp 18   SpO2 100%  No data found. GEN: NAD PULM: normal effort ABD: Soft, nontender SKIN: No rash or  cyanosis; warm and well-perfused GU: labia majora and minora with mild erythema consistent with vulvar candidiasis, on speculum exam noted white thin creamy discharge.  NEURO: Awake, alert, no focal deficits grossly, normal speech   Physical Exam  Urgent Care Course   Clinical Course   Clinically consistent with vulvovaginal yeast infection. Will treat presumptively with Diflucan 150mg  once. Asked patient to wait 72 hours after dose and to only take second dose if still symptomatic. Did send wet prep as well. Patient is stable for discharge.  Procedures  Labs Review Labs Reviewed  CERVICOVAGINAL ANCILLARY ONLY    Imaging Review No results found.   MDM   1. Vaginal yeast infection   Clinically consistent with vulvovaginal yeast infection. Will treat presumptively with Diflucan 150mg  once. Asked patient to wait 72 hours after dose and to only take second dose if still symptomatic. Did send wet prep as well. Patient is stable for discharge.  Patient's history, assessment and plan discussed with Dr. Bridgett Larsson prior to patient discharge.     Smiley Houseman, MD 06/23/16 1131    Smiley Houseman, MD 06/23/16 [phone]

## 2016-06-23 NOTE — Discharge Instructions (Signed)
It seems that your symptoms are consistent with a vaginal yeast infection. I have sent Fluconazole to the pharmacy. Please take 1 tablet today. Only take the second tablet if you still have symptoms 3 days after the first dose. If your test comes back as a different diagnosis, we will call you.

## 2016-06-23 NOTE — ED Triage Notes (Signed)
Took cipro a few weeks ago. Patient developed itching and slight vaginal discharge about 1 1/2 weeks ago. Patient has not tried any over the counter .  Denies abdominal pain, denies back pain.  Was taking cipro for uti.  11/27 was the last dose of medication

## 2016-06-24 LAB — CERVICOVAGINAL ANCILLARY ONLY: WET PREP (BD AFFIRM): NEGATIVE

## 2016-07-01 ENCOUNTER — Other Ambulatory Visit: Payer: Self-pay | Admitting: Family Medicine

## 2016-07-01 MED FILL — SYNTHROID 175 MCG TABLET: 175 | 30 days supply | Qty: 30 | Fill #0

## 2016-08-10 MED FILL — SYNTHROID 175 MCG TABLET: 175 | 30 days supply | Qty: 30 | Fill #1

## 2016-08-10 MED FILL — TOPIRAMATE 50 MG TABLET: 50 | 30 days supply | Qty: 60 | Fill #1

## 2016-09-09 ENCOUNTER — Encounter (INDEPENDENT_AMBULATORY_CARE_PROVIDER_SITE_OTHER): Payer: 59 | Admitting: Family Medicine

## 2016-09-16 MED FILL — SYNTHROID 175 MCG TABLET: 175 | 30 days supply | Qty: 30 | Fill #2

## 2016-10-07 ENCOUNTER — Encounter (INDEPENDENT_AMBULATORY_CARE_PROVIDER_SITE_OTHER): Payer: 59 | Admitting: Family Medicine

## 2016-10-26 MED FILL — SYNTHROID 175 MCG TABLET: 175 | 30 days supply | Qty: 30 | Fill #3

## 2016-10-26 MED FILL — TOPIRAMATE 50 MG TABLET: 50 | 30 days supply | Qty: 60 | Fill #2

## 2016-11-11 ENCOUNTER — Other Ambulatory Visit: Payer: Self-pay

## 2016-11-11 DIAGNOSIS — Z0000 Encounter for general adult medical examination without abnormal findings: Secondary | ICD-10-CM

## 2016-11-11 DIAGNOSIS — E039 Hypothyroidism, unspecified: Secondary | ICD-10-CM

## 2016-11-12 ENCOUNTER — Other Ambulatory Visit: Payer: Self-pay

## 2016-11-12 ENCOUNTER — Other Ambulatory Visit (INDEPENDENT_AMBULATORY_CARE_PROVIDER_SITE_OTHER): Payer: 59

## 2016-11-12 DIAGNOSIS — Z0000 Encounter for general adult medical examination without abnormal findings: Secondary | ICD-10-CM | POA: Diagnosis not present

## 2016-11-12 DIAGNOSIS — E559 Vitamin D deficiency, unspecified: Secondary | ICD-10-CM

## 2016-11-12 DIAGNOSIS — R739 Hyperglycemia, unspecified: Secondary | ICD-10-CM | POA: Diagnosis not present

## 2016-11-12 DIAGNOSIS — E039 Hypothyroidism, unspecified: Secondary | ICD-10-CM

## 2016-11-12 LAB — TSH: TSH: 0.8 u[IU]/mL (ref 0.35–4.50)

## 2016-11-12 LAB — COMPREHENSIVE METABOLIC PANEL
ALBUMIN: 4 g/dL (ref 3.5–5.2)
ALK PHOS: 72 U/L (ref 39–117)
ALT: 19 U/L (ref 0–35)
AST: 12 U/L (ref 0–37)
BILIRUBIN TOTAL: 0.4 mg/dL (ref 0.2–1.2)
BUN: 10 mg/dL (ref 6–23)
CO2: 26 mEq/L (ref 19–32)
Calcium: 9.3 mg/dL (ref 8.4–10.5)
Chloride: 105 mEq/L (ref 96–112)
Creatinine, Ser: 0.64 mg/dL (ref 0.40–1.20)
GFR: 128.56 mL/min (ref >60.00)
GLUCOSE: 104 mg/dL — AB (ref 70–99)
Potassium: 4.2 mEq/L (ref 3.5–5.1)
Sodium: 138 mEq/L (ref 135–145)
TOTAL PROTEIN: 7 g/dL (ref 6.0–8.3)

## 2016-11-12 LAB — CBC
HCT: 42.1 % (ref 36.0–46.0)
HEMOGLOBIN: 14.2 g/dL (ref 12.0–15.0)
MCHC: 33.7 g/dL (ref 30.0–36.0)
MCV: 93.5 fl (ref 78.0–100.0)
PLATELETS: 417 10*3/uL — AB (ref 150.0–400.0)
RBC: 4.51 Mil/uL (ref 3.87–5.11)
RDW: 12.7 % (ref 11.5–15.5)
WBC: 8.3 10*3/uL (ref 4.0–10.5)

## 2016-11-12 LAB — LIPID PANEL
Cholesterol: 185 mg/dL (ref 0–200)
HDL: 53.8 mg/dL (ref >39.00)
LDL Cholesterol: 107 mg/dL — ABNORMAL HIGH (ref 0–99)
NonHDL: 130.78
TRIGLYCERIDES: 121 mg/dL (ref 0.0–149.0)
Total CHOL/HDL Ratio: 3
VLDL: 24.2 mg/dL (ref 0.0–40.0)

## 2016-11-12 LAB — HEMOGLOBIN A1C: Hgb A1c MFr Bld: 5 % (ref 4.6–6.5)

## 2016-11-12 LAB — VITAMIN D 25 HYDROXY (VIT D DEFICIENCY, FRACTURES): VITD: 42.63 ng/mL (ref 30.00–100.00)

## 2016-11-15 ENCOUNTER — Ambulatory Visit (INDEPENDENT_AMBULATORY_CARE_PROVIDER_SITE_OTHER): Payer: 59 | Admitting: Family Medicine

## 2016-11-15 ENCOUNTER — Encounter: Payer: Self-pay | Admitting: Family Medicine

## 2016-11-15 DIAGNOSIS — G43909 Migraine, unspecified, not intractable, without status migrainosus: Secondary | ICD-10-CM

## 2016-11-15 DIAGNOSIS — Z0001 Encounter for general adult medical examination with abnormal findings: Secondary | ICD-10-CM

## 2016-11-15 DIAGNOSIS — E559 Vitamin D deficiency, unspecified: Secondary | ICD-10-CM | POA: Diagnosis not present

## 2016-11-15 DIAGNOSIS — Z0000 Encounter for general adult medical examination without abnormal findings: Secondary | ICD-10-CM

## 2016-11-15 DIAGNOSIS — R739 Hyperglycemia, unspecified: Secondary | ICD-10-CM | POA: Diagnosis not present

## 2016-11-15 DIAGNOSIS — F4320 Adjustment disorder, unspecified: Secondary | ICD-10-CM | POA: Diagnosis not present

## 2016-11-15 DIAGNOSIS — N809 Endometriosis, unspecified: Secondary | ICD-10-CM | POA: Diagnosis not present

## 2016-11-15 DIAGNOSIS — F4321 Adjustment disorder with depressed mood: Secondary | ICD-10-CM

## 2016-11-15 HISTORY — DX: Hyperglycemia, unspecified: R73.9

## 2016-11-15 HISTORY — DX: Adjustment disorder, unspecified: F43.20

## 2016-11-15 HISTORY — DX: Adjustment disorder with depressed mood: F43.21

## 2016-11-15 MED ORDER — BUTALBITAL-APAP-CAFFEINE 50-325-40 MG PO TABS: 1.0000 | ORAL_TABLET | ORAL | 0 refills | Status: DC | PRN

## 2016-11-15 MED ORDER — ALPRAZOLAM 0.25 MG PO TABS: 0.2500 mg | ORAL_TABLET | Freq: Two times a day (BID) | ORAL | 2 refills | Status: DC | PRN

## 2016-11-15 MED ORDER — PROMETHAZINE HCL 25 MG PO TABS: 12.5000 mg | ORAL_TABLET | Freq: Three times a day (TID) | ORAL | 0 refills | Status: DC | PRN

## 2016-11-15 MED FILL — PROMETHAZINE 25 MG TABLET: 25 | 7 days supply | Qty: 20 | Fill #0

## 2016-11-15 MED FILL — BUTALB-ACETAMIN-CAFF 50-325: 50-325-40 | 3 days supply | Qty: 14 | Fill #0

## 2016-11-15 MED FILL — ALPRAZolam 0.25 MG TABS: 0.25 | 15 days supply | Qty: 30 | Fill #0

## 2016-11-15 NOTE — Assessment & Plan Note (Signed)
Increased frequency with increased stress. Does not tolerate Imitrex. Can use NSAIDs prn as she has been doing but also given a small amount of Bupap with caffeine and Phenergan to try prn for severe headache and or nausea.

## 2016-11-15 NOTE — Assessment & Plan Note (Signed)
Patient encouraged to maintain heart healthy diet, regular exercise, adequate sleep. Consider daily probiotics. Take medications as prescribed 

## 2016-11-15 NOTE — Assessment & Plan Note (Signed)
s/p hysterectomy in 2010 doing well without concerns. Will consider pelvic in future.

## 2016-11-15 NOTE — Assessment & Plan Note (Addendum)
Very stressed with husband's illness and a cousin just died in a house fire. She declines a daily SSRI for now but is allowed a small amount of Alprazolam 0.25 mg to use prn during her husband's cancer treatments

## 2016-11-15 NOTE — Progress Notes (Signed)
Pre visit review using our clinic review tool, if applicable. No additional management support is needed unless otherwise documented below in the visit note. 

## 2016-11-15 NOTE — Progress Notes (Signed)
Patient ID: Katie Henry, female   DOB: March 13, 1971, 46 y.o.   MRN: [id number]   Subjective:  I acted as a Education administrator for Penni Homans, [place], [place]   Patient ID: Katie Henry, female    DOB: 04-Sep-1970, 46 y.o.   MRN: [id number]  Chief Complaint  Patient presents with  . Annual Exam  . Scrartchy Throat    Since Sunday.    HPI  Patient is in today for an annual examination. Patient states that her throat has been scratchy since Sunday after doing some yard work on Saturday. Patient has a Hx of migraines, hypothyroidism, Vitamin D Deficiency. Patient has no additional acute concerns noted at this time. No recent febrile illness or hospitalization but she does get tearful in the visit secondary to her husband's ongoing treatments for cancer and his struggles with nausea, vomiting and weakness. She also lost a cousin whom she grew up with [place] brother to a house fire in the last 2 weeks as well. She endorses anhedonia but not suicidal ideation. Does note headaches become worse in frequency and intensity since stress has increased. She does not tolerate Imitrex so uses Advil only when the headaches are severe. This is somewhat helpful most of the time. Denies CP/palp/SOB/congestion/fevers/GI or GU c/o. Taking meds as prescribed  Patient Care Team: [name], MD as PCP - General (Family Medicine)   Past Medical History:  Diagnosis Date  . Dental infection 05/28/2011  . Endometriosis   . Fainting episodes    Dr Luther Parody related  . Graves disease   . Grief reaction 11/15/2016  . History of kidney stones 2003   onset with pregnacy   . History of UTI   . Hyperglycemia 11/15/2016  . Hyperthyroidism   . IBS (irritable bowel syndrome)   . Migraine   . Syncope   . Vitamin D deficiency     Past Surgical History:  Procedure Laterality Date  . APPENDECTOMY  1991  . ENDOMETRIAL ABLATION  01/2007  . THYROID SURGERY    . VAGINAL HYSTERECTOMY  2010   ovaries still in place     Family History  Problem Relation Age of Onset  . Hyperlipidemia Mother   . Hyperlipidemia Maternal Grandmother   . Diabetes Maternal Grandmother   . Kidney disease Maternal Grandmother   . Hyperlipidemia Maternal Grandfather   . Heart disease Maternal Grandfather 60    MI  . Prostate cancer Maternal Grandfather   . Hypertension Father   . Dementia Father   . Seizures Father     s/p TBI  . Hypertension Paternal Grandfather   . Graves' disease Sister   . Obesity Brother   . Prostate cancer      maternal and paternal grandparents  . Heart attack Maternal Aunt     deceased age 67    Social History   Social History  . Marital status: Married    Spouse name: N/A  . Number of children: 2  . Years of education: 13   Occupational History  .  [employer]   Social History Main Topics  . Smoking status: Former Smoker    Types: Cigarettes  . Smokeless tobacco: Never Used     Comment: quit one year ago.  . Alcohol use 0.6 oz/week    1 Glasses of wine per week  . Drug use: No  . Sexual activity: Yes    Partners: Male     Comment: work at [employer], lives with fiance  and son   Other Topics Concern  . Not on file   Social History Narrative   Engaged, two sons.   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   No Tob, occ alcohol, no drugs.    Outpatient Medications Prior to Visit  Medication Sig Dispense Refill  . cetirizine (ZYRTEC) 10 MG tablet Take 1 tablet (10 mg total) by mouth daily. 30 tablet 0  . fluticasone (FLONASE) 50 MCG/ACT nasal spray Place 2 sprays into both nostrils daily. 16 g 6  . ibuprofen (ADVIL,MOTRIN) 400 MG tablet Take 1 tablet (400 mg total) by mouth 3 (three) times daily as needed. 90 tablet 2  . Magnesium 500 MG TABS Take 500 mg by mouth daily.    . Probiotic Product (ALIGN) 4 MG CAPS Take 1 capsule by mouth daily. 30 capsule 0  . SYNTHROID 175 MCG tablet TAKE 1 TABLET BY MOUTH DAILY BEFORE BREAKFAST 90  tablet PRN  . topiramate (TOPAMAX) 50 MG tablet TAKE 1 TABLET BY MOUTH 2 TIMES DAILY. 60 tablet PRN  . Vitamin D, Ergocalciferol, (DRISDOL) 50000 UNITS CAPS capsule Take 1 capsule (50,000 Units total) by mouth every 7 (seven) days. 4 capsule 3  . ALPRAZolam (XANAX) 0.25 MG tablet Take 1 tablet (0.25 mg total) by mouth 2 (two) times daily as needed for anxiety or sleep. 20 tablet 0  . fluconazole (DIFLUCAN) 150 MG tablet Take one tablet once. If symptoms are persistent after 3 days then take the second tablet . (Patient not taking: Reported on 11/15/2016) 2 tablet 0   No facility-administered medications prior to visit.     Allergies  Allergen Reactions  . Amoxicillin Nausea And Vomiting  . Codeine Nausea And Vomiting  . Imitrex [Sumatriptan]     Nausea and vomiting  . Ultram [Tramadol Hcl] Nausea And Vomiting    Review of Systems  Constitutional: Positive for malaise/fatigue. Negative for fever.  HENT: Negative for congestion.   Eyes: Negative for blurred vision.  Respiratory: Negative for cough and shortness of breath.   Cardiovascular: Negative for chest pain, palpitations and leg swelling.  Gastrointestinal: Negative for vomiting.  Musculoskeletal: Negative for back pain.  Skin: Negative for rash.  Neurological: Positive for headaches. Negative for loss of consciousness.  Psychiatric/Behavioral: Positive for depression. The patient is nervous/anxious.        Objective:    Physical Exam  Constitutional: She is oriented to person, place, and time. She appears well-developed and well-nourished. No distress.  HENT:  Head: Normocephalic and atraumatic.  Eyes: Conjunctivae are normal.  Neck: Normal range of motion. No thyromegaly present.  Cardiovascular: Normal rate and regular rhythm.   Pulmonary/Chest: Effort normal and breath sounds normal. She has no wheezes.  Abdominal: Soft. Bowel sounds are normal. There is no tenderness.  Musculoskeletal: She exhibits no edema or  deformity.  Neurological: She is alert and oriented to person, place, and time.  Skin: Skin is warm and dry. She is not diaphoretic.  Psychiatric: She has a normal mood and affect.  tearful    BP (!) 145/85 (BP Location: Left Arm, Patient Position: Sitting, Cuff Size: Large)   Pulse 81   Temp 98.1 F (36.7 C) (Oral)   Ht 5\' 4"  (1.626 m)   Wt 199 lb 9.6 oz (90.5 kg)   SpO2 100% Comment: RA  BMI 34.26 kg/m  Wt Readings from Last 3 Encounters:  11/15/16 199 lb 9.6 oz (90.5 kg)  11/24/15 190 lb (86.2 kg)  09/15/15 195 lb 14.4 oz (88.9 kg)   BP Readings from Last 3 Encounters:  11/15/16 (!) 145/85  06/23/16 134/96  06/21/16 126/86     Immunization History  Administered Date(s) Administered  . Influenza Split 04/04/2012  . Influenza-Unspecified 04/18/2013, 05/17/2015, 05/13/2016    Health Maintenance  Topic Date Due  . HIV Screening  01/05/1986  . PAP SMEAR  12/04/2011  . INFLUENZA VACCINE  02/16/2017  . TETANUS/TDAP  04/18/2021    Lab Results  Component Value Date   WBC 8.3 11/12/2016   HGB 14.2 11/12/2016   HCT 42.1 11/12/2016   PLT 417.0 (H) 11/12/2016   GLUCOSE 104 (H) 11/12/2016   CHOL 185 11/12/2016   TRIG 121.0 11/12/2016   HDL 53.80 11/12/2016   LDLCALC 107 (H) 11/12/2016   ALT 19 11/12/2016   AST 12 11/12/2016   NA 138 11/12/2016   K 4.2 11/12/2016   CL 105 11/12/2016   CREATININE 0.64 11/12/2016   BUN 10 11/12/2016   CO2 26 11/12/2016   TSH 0.80 11/12/2016   HGBA1C 5.0 11/12/2016    Lab Results  Component Value Date   TSH 0.80 11/12/2016   Lab Results  Component Value Date   WBC 8.3 11/12/2016   HGB 14.2 11/12/2016   HCT 42.1 11/12/2016   MCV 93.5 11/12/2016   PLT 417.0 (H) 11/12/2016   Lab Results  Component Value Date   NA 138 11/12/2016   K 4.2 11/12/2016   CO2 26 11/12/2016   GLUCOSE 104 (H) 11/12/2016   BUN 10 11/12/2016   CREATININE 0.64 11/12/2016   BILITOT 0.4 11/12/2016   ALKPHOS 72 11/12/2016   AST 12 11/12/2016   ALT  19 11/12/2016   PROT 7.0 11/12/2016   ALBUMIN 4.0 11/12/2016   CALCIUM 9.3 11/12/2016   ANIONGAP 5 11/24/2015   GFR 128.56 11/12/2016   Lab Results  Component Value Date   CHOL 185 11/12/2016   Lab Results  Component Value Date   HDL 53.80 11/12/2016   Lab Results  Component Value Date   LDLCALC 107 (H) 11/12/2016   Lab Results  Component Value Date   TRIG 121.0 11/12/2016   Lab Results  Component Value Date   CHOLHDL 3 11/12/2016   Lab Results  Component Value Date   HGBA1C 5.0 11/12/2016         Assessment & Plan:   Problem List Items Addressed This Visit    Vitamin D deficiency    Take daily vitamin D      Migraines    Increased frequency with increased stress. Does not tolerate Imitrex. Can use NSAIDs prn as she has been doing but also given a small amount of Bupap with caffeine and Phenergan to try prn for severe headache and or nausea.      Relevant Medications   butalbital-acetaminophen-caffeine (FIORICET, ESGIC) 50-325-40 MG tablet   Endometriosis    s/p hysterectomy in 2010 doing well without concerns. Will consider pelvic in future.       Preventative health care    Patient encouraged to maintain heart healthy diet, regular exercise, adequate sleep. Consider daily probiotics. Take medications as prescribed      Grief reaction    Very stressed with husband's illness and a cousin just died in a house fire. She declines a daily SSRI for now but is allowed a small amount of Alprazolam 0.25 mg to use prn during her husband's cancer treatments      Hyperglycemia    Very  mild, new, hgba1c wnl, minimize simple carbs         I have discontinued Ms. Britain's fluconazole. I am also having her start on butalbital-acetaminophen-caffeine and promethazine. Additionally, I am having her maintain her ALIGN, Magnesium, ibuprofen, Vitamin D (Ergocalciferol), topiramate, fluticasone, cetirizine, SYNTHROID, and ALPRAZolam.  Meds ordered this encounter   Medications  . ALPRAZolam (XANAX) 0.25 MG tablet    Sig: Take 1 tablet (0.25 mg total) by mouth 2 (two) times daily as needed for anxiety or sleep.    Dispense:  30 tablet    Refill:  2  . butalbital-acetaminophen-caffeine (FIORICET, ESGIC) 50-325-40 MG tablet    Sig: Take 1 tablet by mouth every 4 (four) hours as needed for headache.    Dispense:  14 tablet    Refill:  0  . promethazine (PHENERGAN) 25 MG tablet    Sig: Take 0.5-1 tablets (12.5-25 mg total) by mouth every 8 (eight) hours as needed for nausea or vomiting.    Dispense:  20 tablet    Refill:  0    CMA served as scribe during this visit. History, Physical and Plan performed by medical provider. Documentation and orders reviewed and attested to.  Penni Homans, MD

## 2016-11-15 NOTE — Assessment & Plan Note (Signed)
Very mild, new, hgba1c wnl, minimize simple carbs

## 2016-11-15 NOTE — Assessment & Plan Note (Signed)
Take daily vitamin D

## 2016-11-15 NOTE — Patient Instructions (Signed)
Preventive Care 46-39 Years, Female Preventive care refers to lifestyle choices and visits with your health care provider that can promote health and wellness. What does preventive care include?  A yearly physical exam. This is also called an annual well check.  Dental exams once or twice a year.  Routine eye exams. Ask your health care provider how often you should have your eyes checked.  Personal lifestyle choices, including:  Daily care of your teeth and gums.  Regular physical activity.  Eating a healthy diet.  Avoiding tobacco and drug use.  Limiting alcohol use.  Practicing safe sex.  Taking vitamin and mineral supplements as recommended by your health care provider. What happens during an annual well check? The services and screenings done by your health care provider during your annual well check will depend on your age, overall health, lifestyle risk factors, and family history of disease. Counseling  Your health care provider may ask you questions about your:  Alcohol use.  Tobacco use.  Drug use.  Emotional well-being.  Home and relationship well-being.  Sexual activity.  Eating habits.  Work and work environment.  Method of birth control.  Menstrual cycle.  Pregnancy history. Screening  You may have the following tests or measurements:  Height, weight, and BMI.  Diabetes screening. This is done by checking your blood sugar (glucose) after you have not eaten for a while (fasting).  Blood pressure.  Lipid and cholesterol levels. These may be checked every 5 years starting at age 46.  Skin check.  Hepatitis C blood test.  Hepatitis B blood test.  Sexually transmitted disease (STD) testing.  BRCA-related cancer screening. This may be done if you have a family history of breast, ovarian, tubal, or peritoneal cancers.  Pelvic exam and Pap test. This may be done every 3 years starting at age 46. Starting at age 46, this may be done every 5  years if you have a Pap test in combination with an HPV test. Discuss your test results, treatment options, and if necessary, the need for more tests with your health care provider. Vaccines  Your health care provider may recommend certain vaccines, such as:  Influenza vaccine. This is recommended every year.  Tetanus, diphtheria, and acellular pertussis (Tdap, Td) vaccine. You may need a Td booster every 10 years.  Varicella vaccine. You may need this if you have not been vaccinated.  HPV vaccine. If you are 26 or younger, you may need three doses over 6 months.  Measles, mumps, and rubella (MMR) vaccine. You may need at least one dose of MMR. You may also need a second dose.  Pneumococcal 13-valent conjugate (PCV13) vaccine. You may need this if you have certain conditions and were not previously vaccinated.  Pneumococcal polysaccharide (PPSV23) vaccine. You may need one or two doses if you smoke cigarettes or if you have certain conditions.  Meningococcal vaccine. One dose is recommended if you are age 19-21 years and a first-year college student living in a residence hall, or if you have one of several medical conditions. You may also need additional booster doses.  Hepatitis A vaccine. You may need this if you have certain conditions or if you travel or work in places where you may be exposed to hepatitis A.  Hepatitis B vaccine. You may need this if you have certain conditions or if you travel or work in places where you may be exposed to hepatitis B.  Haemophilus influenzae type b (Hib) vaccine. You may need this   if you have certain risk factors. Talk to your health care provider about which screenings and vaccines you need and how often you need them. This information is not intended to replace advice given to you by your health care provider. Make sure you discuss any questions you have with your health care provider. Document Released: 08/31/2001 Document Revised: 03/24/2016  Document Reviewed: 05/06/2015 Elsevier Interactive Patient Education  2017 [employer].

## 2016-12-03 MED FILL — SYNTHROID 175 MCG TABLET: 175 | 30 days supply | Qty: 30 | Fill #4

## 2016-12-28 ENCOUNTER — Encounter: Payer: Self-pay | Admitting: Family Medicine

## 2016-12-28 ENCOUNTER — Other Ambulatory Visit: Payer: Self-pay | Admitting: Family Medicine

## 2016-12-28 DIAGNOSIS — M79673 Pain in unspecified foot: Secondary | ICD-10-CM

## 2017-01-06 ENCOUNTER — Encounter: Payer: 59 | Admitting: Family Medicine

## 2017-01-11 ENCOUNTER — Ambulatory Visit (INDEPENDENT_AMBULATORY_CARE_PROVIDER_SITE_OTHER): Payer: 59 | Admitting: Podiatry

## 2017-01-11 ENCOUNTER — Encounter: Payer: Self-pay | Admitting: Podiatry

## 2017-01-11 DIAGNOSIS — M7742 Metatarsalgia, left foot: Secondary | ICD-10-CM

## 2017-01-11 DIAGNOSIS — M21629 Bunionette of unspecified foot: Secondary | ICD-10-CM | POA: Diagnosis not present

## 2017-01-11 DIAGNOSIS — M7741 Metatarsalgia, right foot: Secondary | ICD-10-CM

## 2017-01-11 DIAGNOSIS — M722 Plantar fascial fibromatosis: Secondary | ICD-10-CM | POA: Diagnosis not present

## 2017-01-11 MED ORDER — METHYLPREDNISOLONE 4 MG PO TBPK: ORAL_TABLET | ORAL | 0 refills | Status: DC

## 2017-01-11 MED ORDER — MELOXICAM 15 MG PO TABS: 15.0000 mg | ORAL_TABLET | Freq: Every day | ORAL | 0 refills | Status: DC

## 2017-01-11 MED FILL — METHYLPREDNISOLONE 4 MG TAB: 4 | 6 days supply | Qty: 21 | Fill #0

## 2017-01-11 MED FILL — MELOXICAM 15 MG TABLET: 15 | 14 days supply | Qty: 14 | Fill #0

## 2017-01-11 MED FILL — SYNTHROID 175 MCG TABLET: 175 | 30 days supply | Qty: 30 | Fill #5

## 2017-01-11 NOTE — Patient Instructions (Signed)
Start with the medrol dose pack (steroid). Once complete you can start the mobic/meloxicam  Have a good rest of your week!   Plantar Fasciitis Rehab Ask your health care provider which exercises are safe for you. Do exercises exactly as told by your health care provider and adjust them as directed. It is normal to feel mild stretching, pulling, tightness, or discomfort as you do these exercises, but you should stop right away if you feel sudden pain or your pain gets worse. Do not begin these exercises until told by your health care provider. Stretching and range of motion exercises These exercises warm up your muscles and joints and improve the movement and flexibility of your foot. These exercises also help to relieve pain. Exercise A: Plantar fascia stretch  1. Sit with your left / right leg crossed over your opposite knee. 2. Hold your heel with one hand with that thumb near your arch. With your other hand, hold your toes and gently pull them back toward the top of your foot. You should feel a stretch on the bottom of your toes or your foot or both. 3. Hold this stretch for__________ seconds. 4. Slowly release your toes and return to the starting position. Repeat __________ times. Complete this exercise __________ times a day. Exercise B: Gastroc, standing  1. Stand with your hands against a wall. 2. Extend your left / right leg behind you, and bend your front knee slightly. 3. Keeping your heels on the floor and keeping your back knee straight, shift your weight toward the wall without arching your back. You should feel a gentle stretch in your left / right calf. 4. Hold this position for __________ seconds. Repeat __________ times. Complete this exercise __________ times a day. Exercise C: Soleus, standing 1. Stand with your hands against a wall. 2. Extend your left / right leg behind you, and bend your front knee slightly. 3. Keeping your heels on the floor, bend your back knee and  slightly shift your weight over the back leg. You should feel a gentle stretch deep in your calf. 4. Hold this position for __________ seconds. Repeat __________ times. Complete this exercise __________ times a day. Exercise D: Gastrocsoleus, standing 1. Stand with the ball of your left / right foot on a step. The ball of your foot is on the walking surface, right under your toes. 2. Keep your other foot firmly on the same step. 3. Hold onto the wall or a railing for balance. 4. Slowly lift your other foot, allowing your body weight to press your heel down over the edge of the step. You should feel a stretch in your left / right calf. 5. Hold this position for __________ seconds. 6. Return both feet to the step. 7. Repeat this exercise with a slight bend in your left / right knee. Repeat __________ times with your left / right knee straight and __________ times with your left / right knee bent. Complete this exercise __________ times a day. Balance exercise This exercise builds your balance and strength control of your arch to help take pressure off your plantar fascia. Exercise E: Single leg stand 1. Without shoes, stand near a railing or in a doorway. You may hold onto the railing or door frame as needed. 2. Stand on your left / right foot. Keep your big toe down on the floor and try to keep your arch lifted. Do not let your foot roll inward. 3. Hold this position for __________ seconds. 4.  If this exercise is too easy, you can try it with your eyes closed or while standing on a pillow. Repeat __________ times. Complete this exercise __________ times a day. This information is not intended to replace advice given to you by your health care provider. Make sure you discuss any questions you have with your health care provider. Document Released: 07/05/2005 Document Revised: 03/09/2016 Document Reviewed: 05/19/2015 Elsevier Interactive Patient Education  2018 [employer].

## 2017-01-11 NOTE — Progress Notes (Signed)
Subjective:    Patient ID: Katie Henry, female   DOB: 46 y.o.   MRN: [id number]   HPI 46 year old female presents the also concerns of bilateral foot pain. She states the left side is worse than the right. The pain has been ongoing for 6-8 months. She states that she gets pain in the bottom of her heel as well as the office after the foot she points fifth metatarsal head where she gets pain. She states the pain gets worse after she walks during the day and hard surfaces. She denies any recent injury or trauma she denies any increase in swelling. She was at the beach over the weekend on sand and this did aggravate her foot quite a bit. She occasionally gets pain in the ball of her foot as well. The pain does not wake her up at night. She denies any sharp shooting pain. She denies any claudication symptoms. She has no other complaints.   Review of Systems  All other systems reviewed and are negative.       Objective:  Physical Exam General: AAO x3, NAD  Dermatological: Skin is warm, dry and supple bilateral. Nails x 10 are well manicured; remaining integument appears unremarkable at this time. There are no open sores, no preulcerative lesions, no rash or signs of infection present.  Vascular: Dorsalis Pedis artery and Posterior Tibial artery pedal pulses are 2/4 bilateral with immedate capillary fill time. Pedal hair growth present. There is no pain with calf compression, swelling, warmth, erythema.   Neruologic: Grossly intact via light touch bilateral. Vibratory intact via tuning fork bilateral. Protective threshold with Semmes Wienstein monofilament intact to all pedal sites bilateral. Negative tinel sign bilaterally.   Musculoskeletal: Tenderness to palpation along the plantar medial tubercle of the calcaneus at the insertion of plantar fascia on the left >> right foot. There is no pain along the course of the plantar fascia within the arch of the foot. Plantar fascia appears to be  intact. There is no pain with lateral compression of the calcaneus or pain with vibratory sensation. There is no pain along the course or insertion of the achilles tendon. Taylor's bunions are present bilaterally and on the left side there is slight erythema along the lateral aspect of the fifth metatarsal head from irritation in shoe gear. Minimal discomfort in the left side is no pain of the right. She has inverted foot type upon weightbearing. She walks the lateral aspect of the foot. No other areas of tenderness to bilateral lower extremities. Muscular strength 5/5 in all groups tested bilateral.  Gait: Unassisted, Nonantalgic.      Assessment:     Bilateral foot pain likely result of plantar fasciitis left worse than right, Taylor's bunion    Plan:  -Treatment options discussed including all alternatives, risks, and complications -Etiology of symptoms were discussed -Medrol dose pack. Once complete can take mobic prn. Discussed the medications as well as potential side effects. -Plantar fascial braces bilaterally. Upon application of the plantar fascial brace she states that she almost immediately on the left foot. -Stretching and icing daily.  -Discussed shoe gear modifications and orthotics. Long-term and with a custom instability more beneficial for her. -In the future if symptoms continue to do steroid injection. -RTC 4 weeks or sooner if needed.   Celesta Gentile, DPM

## 2017-02-01 ENCOUNTER — Telehealth: Payer: Self-pay

## 2017-02-01 ENCOUNTER — Other Ambulatory Visit (INDEPENDENT_AMBULATORY_CARE_PROVIDER_SITE_OTHER): Payer: 59

## 2017-02-01 DIAGNOSIS — R319 Hematuria, unspecified: Secondary | ICD-10-CM | POA: Diagnosis not present

## 2017-02-01 NOTE — Telephone Encounter (Signed)
Patient needs UA and culture for hematuria.   Lab orders placed.  PC

## 2017-02-02 LAB — URINALYSIS
Bilirubin Urine: NEGATIVE
KETONES UR: NEGATIVE
Leukocytes, UA: NEGATIVE
Nitrite: NEGATIVE
SPECIFIC GRAVITY, URINE: 1.015 (ref 1.000–1.030)
TOTAL PROTEIN, URINE-UPE24: NEGATIVE
URINE GLUCOSE: NEGATIVE
UROBILINOGEN UA: 0.2 (ref 0.0–1.0)
pH: 7 (ref 5.0–8.0)

## 2017-02-03 LAB — URINE CULTURE

## 2017-02-08 ENCOUNTER — Encounter: Payer: Self-pay | Admitting: Podiatry

## 2017-02-08 ENCOUNTER — Ambulatory Visit (INDEPENDENT_AMBULATORY_CARE_PROVIDER_SITE_OTHER): Payer: 59 | Admitting: Podiatry

## 2017-02-08 DIAGNOSIS — M722 Plantar fascial fibromatosis: Secondary | ICD-10-CM

## 2017-02-08 NOTE — Patient Instructions (Signed)
Continue the exercises, icing every day.   Have a great rest of your week!  -----  Plantar Fasciitis Rehab Ask your health care provider which exercises are safe for you. Do exercises exactly as told by your health care provider and adjust them as directed. It is normal to feel mild stretching, pulling, tightness, or discomfort as you do these exercises, but you should stop right away if you feel sudden pain or your pain gets worse. Do not begin these exercises until told by your health care provider. Stretching and range of motion exercises These exercises warm up your muscles and joints and improve the movement and flexibility of your foot. These exercises also help to relieve pain. Exercise A: Plantar fascia stretch  1. Sit with your left / right leg crossed over your opposite knee. 2. Hold your heel with one hand with that thumb near your arch. With your other hand, hold your toes and gently pull them back toward the top of your foot. You should feel a stretch on the bottom of your toes or your foot or both. 3. Hold this stretch for__________ seconds. 4. Slowly release your toes and return to the starting position. Repeat __________ times. Complete this exercise __________ times a day. Exercise B: Gastroc, standing  1. Stand with your hands against a wall. 2. Extend your left / right leg behind you, and bend your front knee slightly. 3. Keeping your heels on the floor and keeping your back knee straight, shift your weight toward the wall without arching your back. You should feel a gentle stretch in your left / right calf. 4. Hold this position for __________ seconds. Repeat __________ times. Complete this exercise __________ times a day. Exercise C: Soleus, standing 1. Stand with your hands against a wall. 2. Extend your left / right leg behind you, and bend your front knee slightly. 3. Keeping your heels on the floor, bend your back knee and slightly shift your weight over the back  leg. You should feel a gentle stretch deep in your calf. 4. Hold this position for __________ seconds. Repeat __________ times. Complete this exercise __________ times a day. Exercise D: Gastrocsoleus, standing 1. Stand with the ball of your left / right foot on a step. The ball of your foot is on the walking surface, right under your toes. 2. Keep your other foot firmly on the same step. 3. Hold onto the wall or a railing for balance. 4. Slowly lift your other foot, allowing your body weight to press your heel down over the edge of the step. You should feel a stretch in your left / right calf. 5. Hold this position for __________ seconds. 6. Return both feet to the step. 7. Repeat this exercise with a slight bend in your left / right knee. Repeat __________ times with your left / right knee straight and __________ times with your left / right knee bent. Complete this exercise __________ times a day. Balance exercise This exercise builds your balance and strength control of your arch to help take pressure off your plantar fascia. Exercise E: Single leg stand 1. Without shoes, stand near a railing or in a doorway. You may hold onto the railing or door frame as needed. 2. Stand on your left / right foot. Keep your big toe down on the floor and try to keep your arch lifted. Do not let your foot roll inward. 3. Hold this position for __________ seconds. 4. If this exercise is too easy,  you can try it with your eyes closed or while standing on a pillow. Repeat __________ times. Complete this exercise __________ times a day. This information is not intended to replace advice given to you by your health care provider. Make sure you discuss any questions you have with your health care provider. Document Released: 07/05/2005 Document Revised: 03/09/2016 Document Reviewed: 05/19/2015 Elsevier Interactive Patient Education  2018 [employer].

## 2017-02-08 NOTE — Progress Notes (Signed)
Subjective: Katie Henry presents to the office today for follow-up evaluation of bilateral heel pain. They state that they are doing good and the pain is improved. She states that when she started wearing the plantar fascial braces both stretching icing the pain completely went away. She is on her feet quite a bit on Sunday July the walking she started have some recurrence. She denies any pain today. They have been icing, stretching, try to wear supportive shoe as much as possible. No other complaints at this time. No acute changes since last appointment. They deny any systemic complaints such as fevers, chills, nausea, vomiting.  Objective: General: AAO x3, NAD  Dermatological: Skin is warm, dry and supple bilateral. Nails x 10 are well manicured; remaining integument appears unremarkable at this time. There are no open sores, no preulcerative lesions, no rash or signs of infection present.  Vascular: Dorsalis Pedis artery and Posterior Tibial artery pedal pulses are 2/4 bilateral with immedate capillary fill time. Pedal hair growth present. There is no pain with calf compression, swelling, warmth, erythema.   Neruologic: Grossly intact via light touch bilateral. Vibratory intact via tuning fork bilateral. Protective threshold with Semmes Wienstein monofilament intact to all pedal sites bilateral.   Musculoskeletal: There is minimal tenderness palpation along the plantar medial tubercle of the calcaneus at the insertion of the plantar fascia on the right foot. There is no pain to the left foot. There is  no pain along the course of the plantar fascia within the arch of the foot. Plantar fascia appears to be intact bilaterally. There is no pain with lateral compression of the calcaneus and there is no pain with vibratory sensation. There is no pain along the course or insertion of the Achilles tendon. There are no other areas of tenderness to bilateral lower extremities. No gross boney pedal  deformities bilateral. No pain, crepitus, or limitation noted with foot and ankle range of motion bilateral. Muscular strength 5/5 in all groups tested bilateral.  Gait: Unassisted, Nonantalgic.   Assessment: Presents for follow-up evaluation for heel pain, likely plantar fasciitis   Plan: -Treatment options discussed including all alternatives, risks, and complications -Discussed steroid injection but she wishes to hold off today. -Ice and stretching exercises on a daily basis. -Discussed shoe gear modifications and orthotics. Power steps were dispensed today.  -Continue supportive shoe gear. -Follow-up if symptoms do not resolve the next 4-6 weeks or sooner if any problems arise. In the meantime, encouraged to call the office with any questions, concerns, change in symptoms.   Celesta Gentile, DPM

## 2017-03-07 MED FILL — SYNTHROID 175 MCG TABLET: 175 | 30 days supply | Qty: 30 | Fill #6

## 2017-04-07 MED FILL — SYNTHROID 175 MCG TABLET: 175 | 30 days supply | Qty: 30 | Fill #7

## 2017-04-07 MED FILL — diazePAM 10 MG TABS: 10 | 3 days supply | Qty: 3 | Fill #0

## 2017-04-12 ENCOUNTER — Other Ambulatory Visit: Payer: Self-pay | Admitting: Family Medicine

## 2017-04-12 ENCOUNTER — Ambulatory Visit (HOSPITAL_BASED_OUTPATIENT_CLINIC_OR_DEPARTMENT_OTHER)
Admission: RE | Admit: 2017-04-12 | Discharge: 2017-04-12 | Disposition: A | Discharge status: Home / Self Care | Payer: 59 | Source: Ambulatory Visit | Attending: Family Medicine | Admitting: Family Medicine

## 2017-04-12 DIAGNOSIS — Z1231 Encounter for screening mammogram for malignant neoplasm of breast: Secondary | ICD-10-CM | POA: Insufficient documentation

## 2017-05-31 MED FILL — SYNTHROID 175 MCG TABLET: 175 | 30 days supply | Qty: 30 | Fill #8

## 2017-06-18 DIAGNOSIS — R110 Nausea: Secondary | ICD-10-CM | POA: Diagnosis not present

## 2017-06-18 DIAGNOSIS — N390 Urinary tract infection, site not specified: Secondary | ICD-10-CM | POA: Diagnosis not present

## 2017-06-18 DIAGNOSIS — N201 Calculus of ureter: Secondary | ICD-10-CM | POA: Diagnosis not present

## 2017-06-18 DIAGNOSIS — R109 Unspecified abdominal pain: Secondary | ICD-10-CM | POA: Diagnosis not present

## 2017-06-18 DIAGNOSIS — N200 Calculus of kidney: Secondary | ICD-10-CM | POA: Diagnosis not present

## 2017-06-28 ENCOUNTER — Encounter: Payer: Self-pay | Admitting: Family Medicine

## 2017-06-28 MED FILL — ONDANSETRON HCL 4 MG TABLET: 4 | 4 days supply | Qty: 12 | Fill #0

## 2017-06-29 ENCOUNTER — Ambulatory Visit (HOSPITAL_BASED_OUTPATIENT_CLINIC_OR_DEPARTMENT_OTHER)
Admission: RE | Admit: 2017-06-29 | Discharge: 2017-06-29 | Disposition: A | Discharge status: Home / Self Care | Payer: 59 | Source: Ambulatory Visit | Attending: Internal Medicine | Admitting: Internal Medicine

## 2017-06-29 ENCOUNTER — Ambulatory Visit (INDEPENDENT_AMBULATORY_CARE_PROVIDER_SITE_OTHER): Payer: 59 | Admitting: Internal Medicine

## 2017-06-29 ENCOUNTER — Encounter: Payer: Self-pay | Admitting: Internal Medicine

## 2017-06-29 VITALS — BP 122/80 | HR 80 | Temp 97.8°F | Resp 14 | Ht 64.0 in | Wt 190.0 lb

## 2017-06-29 DIAGNOSIS — Z90710 Acquired absence of both cervix and uterus: Secondary | ICD-10-CM | POA: Insufficient documentation

## 2017-06-29 DIAGNOSIS — N200 Calculus of kidney: Secondary | ICD-10-CM

## 2017-06-29 DIAGNOSIS — K760 Fatty (change of) liver, not elsewhere classified: Secondary | ICD-10-CM | POA: Insufficient documentation

## 2017-06-29 DIAGNOSIS — K8020 Calculus of gallbladder without cholecystitis without obstruction: Secondary | ICD-10-CM | POA: Diagnosis not present

## 2017-06-29 DIAGNOSIS — N132 Hydronephrosis with renal and ureteral calculous obstruction: Secondary | ICD-10-CM | POA: Diagnosis not present

## 2017-06-29 DIAGNOSIS — R109 Unspecified abdominal pain: Secondary | ICD-10-CM | POA: Diagnosis not present

## 2017-06-29 LAB — POC URINALSYSI DIPSTICK (AUTOMATED)
BILIRUBIN UA: NEGATIVE
Glucose, UA: NEGATIVE
Ketones, UA: NEGATIVE
Leukocytes, UA: NEGATIVE
NITRITE UA: NEGATIVE
PROTEIN UA: NEGATIVE
RBC UA: NEGATIVE
Spec Grav, UA: 1.025 (ref 1.010–1.025)
Urobilinogen, UA: 0.2 E.U./dL
pH, UA: 6 (ref 5.0–8.0)

## 2017-06-29 MED ORDER — KETOROLAC TROMETHAMINE 10 MG PO TABS: 10.0000 mg | ORAL_TABLET | Freq: Four times a day (QID) | ORAL | 0 refills | Status: DC | PRN

## 2017-06-29 MED ORDER — TAMSULOSIN HCL 0.4 MG PO CAPS: 0.4000 mg | ORAL_CAPSULE | Freq: Every day | ORAL | 0 refills | Status: DC

## 2017-06-29 MED FILL — TAMSULOSIN HCL 0.4 MG CAP: 0.4 | 30 days supply | Qty: 30 | Fill #0

## 2017-06-29 MED FILL — KETOROLAC 10 MG TABLET: 10 | 5 days supply | Qty: 20 | Fill #0

## 2017-06-29 NOTE — Progress Notes (Signed)
Subjective:    Patient ID: Katie Henry, female    DOB: 24-Nov-1970, 46 y.o.   MRN: [id number]  DOS:  06/29/2017 Type of visit - description : acute Interval history: Patient has a history of previous kidney stones. This time sx started ~ 06/18/2017: Bilateral lower abdominal pain, left flank pain, nausea, vomiting.  She had dysuria at first.  Later on the pain localized more on the left lower quadrant. Went to urgent care in [place], x-ray, labs and urine test were done.  Diagnosed with a kidney stone and a UTI. She got Rocephin, prescribed Cipro, Toradol. Since then symptoms improved but they resurface a 06/25/2017 with again pain at the lower abdomen (more on the LLQ) and the left flank pain. Symptoms were on and off but yesterday had an intense episode.  Review of Systems Appetite is a slightly decrease, bowel movements are daily.  No vomiting or diarrhea. No fever chills No gross hematuria No vaginal bleeding or discharge.  No rash anywhere.  Past Medical History:  Diagnosis Date  . Dental infection 05/28/2011  . Endometriosis   . Fainting episodes    Dr Luther Parody related  . Graves disease   . Grief reaction 11/15/2016  . History of kidney stones 2003   onset with pregnacy   . History of UTI   . Hyperglycemia 11/15/2016  . Hyperthyroidism   . IBS (irritable bowel syndrome)   . Migraine   . Syncope   . Vitamin D deficiency     Past Surgical History:  Procedure Laterality Date  . APPENDECTOMY  1991  . ENDOMETRIAL ABLATION  01/2007  . THYROID SURGERY    . VAGINAL HYSTERECTOMY  2010   ovaries still in place    Social History   Socioeconomic History  . Marital status: Married    Spouse name: Not on file  . Number of children: 2  . Years of education: 26  . Highest education level: Not on file  Social Needs  . Financial resource strain: Not on file  . Food insecurity - worry: Not on file  . Food insecurity - inability: Not on file  . Transportation needs  - medical: Not on file  . Transportation needs - non-medical: Not on file  Occupational History    Employer: [employer]  Tobacco Use  . Smoking status: Former Smoker    Types: Cigarettes  . Smokeless tobacco: Never Used  . Tobacco comment: quit one year ago.  Substance and Sexual Activity  . Alcohol use: Yes    Alcohol/week: 0.6 oz    Types: 1 Glasses of wine per week  . Drug use: No  . Sexual activity: Yes    Partners: Male    Comment: work at [employer], lives with fiance and son  Other Topics Concern  . Not on file  Social History Narrative   Engaged, two sons.   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   No Tob, occ alcohol, no drugs.      Allergies as of 06/29/2017      Reactions   Amoxicillin Nausea And Vomiting   Codeine Nausea And Vomiting   Imitrex [sumatriptan]    Nausea and vomiting   Ultram [tramadol Hcl] Nausea And Vomiting      Medication List        Accurate as of 06/29/17  4:15 PM. Always use your most recent med list.          ALIGN  4 MG Caps Take 1 capsule by mouth daily.   butalbital-acetaminophen-caffeine 50-325-40 MG tablet Commonly known as:  FIORICET, ESGIC Take 1 tablet by mouth every 4 (four) hours as needed for headache.   cetirizine 10 MG tablet Commonly known as:  ZYRTEC Take 1 tablet (10 mg total) by mouth daily.   fluticasone 50 MCG/ACT nasal spray Commonly known as:  FLONASE Place 2 sprays into both nostrils daily.   ibuprofen 400 MG tablet Commonly known as:  ADVIL,MOTRIN Take 1 tablet (400 mg total) by mouth 3 (three) times daily as needed.   ketorolac 10 MG tablet Commonly known as:  TORADOL Take 1 tablet (10 mg total) by mouth 4 (four) times daily as needed for moderate pain.   Magnesium 500 MG Tabs Take 500 mg by mouth daily.   meloxicam 15 MG tablet Commonly known as:  MOBIC Take 1 tablet (15 mg total) by mouth daily.   ondansetron 4 MG tablet Commonly known as:   ZOFRAN Take as directed   SYNTHROID 175 MCG tablet Generic drug:  levothyroxine TAKE 1 TABLET BY MOUTH DAILY BEFORE BREAKFAST   tamsulosin 0.4 MG Caps capsule Commonly known as:  FLOMAX Take 1 capsule (0.4 mg total) by mouth daily after supper.   topiramate 50 MG tablet Commonly known as:  TOPAMAX TAKE 1 TABLET BY MOUTH 2 TIMES DAILY.   Vitamin D (Ergocalciferol) 50000 units Caps capsule Commonly known as:  DRISDOL Take 1 capsule (50,000 Units total) by mouth every 7 (seven) days.          Objective:   Physical Exam BP 122/80 (BP Location: Left Arm, Patient Position: Sitting, Cuff Size: Small)   Pulse 80   Temp 97.8 F (36.6 C) (Oral)   Resp 14   Ht 5\' 4"  (1.626 m)   Wt 190 lb (86.2 kg)   SpO2 98%   BMI 32.61 kg/m  General:   Well developed, well nourished . NAD.  HEENT:  Normocephalic . Face symmetric, atraumatic Lungs:  CTA B Normal respiratory effort, no intercostal retractions, no accessory muscle use. Heart: RRR,  no murmur.  no pretibial edema bilaterally  Abdomen:  Not distended, soft, mildly tender throughout the lower abdomen, more so at the LLQ and left from the umbilicus, no mass or rebound.  No CVA tenderness. Skin: Not pale. Not jaundice Neurologic:  alert & oriented X3.  Speech normal, gait appropriate for age and unassisted Psych--  Cognition and judgment appear intact.  Cooperative with normal attention span and concentration.  Behavior appropriate. No anxious or depressed appearing.     Assessment & Plan:   46 year old female with problems that includes h/o  migraines, Graves' disease, kidney stones, hyperglycemia, IBS, previous hysterectomy, appendectomy  L flank and  LLQ abdominal pain: Likely due to kidney stone.  Urine test today negative. Plan: CT stone protocol, push fluids, Flomax, pain control with Tylenol and Toradol (refill Toradol).  Depending on results may need a urology referral. Addendum: CT confirmed left ureteral stone  tandem, 9 x 5 mm.  Will refer to urology and get a BMP.

## 2017-06-29 NOTE — Progress Notes (Signed)
Pre visit review using our clinic review tool, if applicable. No additional management support is needed unless otherwise documented below in the visit note. 

## 2017-06-29 NOTE — Telephone Encounter (Signed)
Pt seen by Dr. Larose Kells 06/29/2017.

## 2017-06-29 NOTE — Patient Instructions (Addendum)
We are scheduling CT  Drink plenty fluids  Continue with Tylenol and Toradol.  Take Toradol with food. Do not mix Toradol with ibuprofen or meloxicam they are all NSAIDs  Strain your urine  Start Flomax  Call if severe symptoms, fever, chills or if you do not pass the stone within the next couple of days.

## 2017-06-30 DIAGNOSIS — N202 Calculus of kidney with calculus of ureter: Secondary | ICD-10-CM | POA: Diagnosis not present

## 2017-06-30 LAB — BASIC METABOLIC PANEL
BUN: 12 mg/dL (ref 6–23)
CHLORIDE: 103 meq/L (ref 96–112)
CO2: 30 meq/L (ref 19–32)
CREATININE: 0.68 mg/dL (ref 0.40–1.20)
Calcium: 9.3 mg/dL (ref 8.4–10.5)
GFR: 119.54 mL/min (ref >60.00)
Glucose, Bld: 92 mg/dL (ref 70–99)
POTASSIUM: 4.3 meq/L (ref 3.5–5.1)
SODIUM: 138 meq/L (ref 135–145)

## 2017-06-30 MED FILL — SULFAMETHOXAZOLE-TMP DS TAB: 800-160 | 5 days supply | Qty: 10 | Fill #0

## 2017-07-06 DIAGNOSIS — N201 Calculus of ureter: Secondary | ICD-10-CM | POA: Diagnosis not present

## 2017-07-06 DIAGNOSIS — N202 Calculus of kidney with calculus of ureter: Secondary | ICD-10-CM | POA: Diagnosis not present

## 2017-07-08 ENCOUNTER — Other Ambulatory Visit: Payer: Self-pay | Admitting: Family Medicine

## 2017-07-08 MED FILL — FLUCONAZOLE 200 MG TABLET: 200 | 1 days supply | Qty: 1 | Fill #0

## 2017-07-11 MED FILL — TOPIRAMATE 50 MG TABLET: 50 | 30 days supply | Qty: 60 | Fill #0

## 2017-07-11 MED FILL — SYNTHROID 175 MCG TABLET: 175 | 30 days supply | Qty: 90 | Fill #0

## 2017-07-15 ENCOUNTER — Other Ambulatory Visit: Payer: Self-pay

## 2017-07-15 ENCOUNTER — Encounter (HOSPITAL_COMMUNITY): Payer: Self-pay | Admitting: *Deleted

## 2017-07-15 ENCOUNTER — Other Ambulatory Visit: Payer: Self-pay | Admitting: Urology

## 2017-07-15 ENCOUNTER — Telehealth: Payer: Self-pay

## 2017-07-15 DIAGNOSIS — N200 Calculus of kidney: Secondary | ICD-10-CM

## 2017-07-15 MED ORDER — HYDROCODONE-ACETAMINOPHEN 5-325 MG PO TABS: 1.0000 | ORAL_TABLET | Freq: Three times a day (TID) | ORAL | 0 refills | Status: DC | PRN

## 2017-07-15 MED FILL — HYDROCODON-APAP 5-325: 5-325 | 5 days supply | Qty: 30 | Fill #0

## 2017-07-15 NOTE — Telephone Encounter (Signed)
Pt having lithotripsy- unable to take Motrin and Tramadol until procedure. Needing something for pain please.

## 2017-07-15 NOTE — Telephone Encounter (Signed)
Spoke with the patient, she is allergic to codeine and tramadol due to nausea but able to take hydrocodone, prescription sent.  Aware not to mix it with Tylenol.

## 2017-07-19 ENCOUNTER — Encounter (HOSPITAL_COMMUNITY): Payer: Self-pay | Admitting: Anesthesiology

## 2017-07-20 NOTE — H&P (Signed)
I have ureteral stone.  HPI: Katie Henry is a 47 year-old female established patient who is here for ureteral stone.  12/318/18: Patient developed left flank pain June 18, 2017. The pain resolved and then recurred. CT scan was done June 29, 2017 which showed some very small left lower pole stones, hydronephrosis down to 2-3 stones in the left proximal ureter. With the above about 3 mm but stacked up to about 8 mm. It was hard to tell if they were visible on the scout, HU 425). There are also some calcifications in the gonadal vein. Urine pH 6. Changed diet to more protein - nuts and fish.   She's been straining. She been drinking a lot of water. She hasn't started tamsulosin. She works with Dr. Larose Kells. No dysuria or fever.   07/06/17: She returns today for follow up. She continues to complain of intermittent left flank and LLQ pain. Pain is well managed with Tylenol most of the time. She also continues to have intermittent episodes of nausea. She denies any gross hematuria or fevers. She denies exacerbation of her voiding symptoms. She denies seeing a stone pass.   The problem is on the left side. She first stated noticing pain on 06/18/2017. This is not her first kidney stone. She is currently having groin pain. She denies having flank pain, back pain, nausea, vomiting, fever, and chills. Pain is occuring on both sides. She has not caught a stone in her urine strainer since her symptoms began.   She has had ureteral stent and ureteroscopy for treatment of her stones in the past.     ALLERGIES: Amoxicillin Codeine Imitrex Ultram    MEDICATIONS: Ketorolac Tromethamine 10 mg tablet  Tamsulosin Hcl 0.4 mg capsule  Zyrtec 10 mg tablet  Align 4 mg (1 billion cell) capsule  Fioricet 50 mg-300 mg-40 mg capsule  Fluticasone Propionate  Magnesium 500 mg capsule  Synthroid 175 mcg tablet  Topiramate 50 mg tablet  Vitamin D3 50,000 unit capsule  Zofran 4 mg tablet     GU PSH: Endometrial  Ablation ESWL Hysterectomy    NON-GU PSH: Appendectomy Thyroid Surgery    GU PMH: Renal calculus - 06/30/2017 Ureteral calculus - 06/30/2017 Endometriosis, Unspec    NON-GU PMH: Bacteriuria - 06/30/2017 Hyperthyroidism Migraine, unspecified, intractable, without status migrainosus Other irritable bowel syndrome Personal history of other endocrine, nutritional and metabolic disease Vitamin D deficiency, unspecified    FAMILY HISTORY: Death - Father Deceased - Father Heart Disease - Aunt, Grandfather renal failure - Grandmother   SOCIAL HISTORY: Marital Status: Married Preferred Language: English; Ethnicity: Not Hispanic Or Latino; Race: Black or African American Current Smoking Status: Patient does not smoke anymore. Has not smoked since 06/19/2007. Smoked for 5 years.   Tobacco Use Assessment Completed: Used Tobacco in last 30 days? Drinks 1 drink per week.  Does not drink caffeine. Patient's occupation Tourist information centre manager.     Notes: 2 sons   REVIEW OF SYSTEMS:    GU Review Female:   Patient reports hard to postpone urination and trouble starting your stream. Patient denies frequent urination, burning /pain with urination, get up at night to urinate, leakage of urine, stream starts and stops, have to strain to urinate, and being pregnant.  Gastrointestinal (Upper):   Patient reports nausea. Patient denies vomiting and indigestion/ heartburn.  Gastrointestinal (Lower):   Patient denies diarrhea and constipation.  Constitutional:   Patient denies fever, fatigue, weight loss, and night sweats.  Skin:   Patient denies skin rash/  lesion and itching.  Eyes:   Patient denies blurred vision and double vision.  Ears/ Nose/ Throat:   Patient denies sore throat and sinus problems.  Hematologic/Lymphatic:   Patient denies swollen glands and easy bruising.  Cardiovascular:   Patient denies leg swelling and chest pains.  Respiratory:   Patient denies cough and shortness of breath.   Endocrine:   Patient denies excessive thirst.  Musculoskeletal:   Patient denies back pain and joint pain.  Neurological:   Patient denies headaches and dizziness.  Psychologic:   Patient denies depression and anxiety.   VITAL SIGNS:      07/06/2017 08:33 AM  Weight 190 lb / 86.18 kg  Height 63 in / 160.02 cm  BP 139/87 mmHg  Pulse 87 /min  Temperature 98.3 F / 36.8 C  BMI 33.7 kg/m   MULTI-SYSTEM PHYSICAL EXAMINATION:    Constitutional: Well-nourished. No physical deformities. Normally developed. Good grooming.  Respiratory: Normal breath sounds. No labored breathing, no use of accessory muscles.   Cardiovascular: Regular rate and rhythm. No murmur, no gallop. Normal temperature, no swelling, no varicosities.   Skin: No paleness, no jaundice, no cyanosis. No lesion, no ulcer, no rash.  Neurologic / Psychiatric: Oriented to time, oriented to place, oriented to person. No depression, no anxiety, no agitation.  Gastrointestinal: LLQ Abdominal tenderness. No mass, no rigidity, non obese abdomen.   Musculoskeletal: Spine, ribs, pelvis no bilateral tenderness. Normal gait and station of head and neck.     PAST DATA REVIEWED:  Source Of History:  Patient  Records Review:   Previous Patient Records  Urine Test Review:   Urinalysis, Urine Culture  X-Ray Review: KUB: Reviewed Films.  C.T. Stone Protocol: Reviewed Films. Reviewed Report.     PROCEDURES:         Renal Ultrasound (Limited) - 32951  Left Kidney: Length:13.6 cm Depth: 5.9 cm Cortical Width:1.3 cm Width:5.4 cm    Left Kidney/Ureter:  Hydronephrosis noted. The renal pelvis measures 2.2 cm. ---- Multiple subcentimeter echogenic foci with and without shadowing with the largest measuring 0.5 cm in the mid pole ( calcification vs vessel).   Bladder:  PVR = 135.2 ml. Bilateral jets not visualized.                KUB - [id number]  A single view of the abdomen is obtained. No obvious opacities noted within the confines of the  right renal shadow or along the expected anatomical tract of the right ureter. Small opacities within the confines of the left ureter, but there is overlying bowel gas today. There are some opacities noted along the expected anatomical tract of the left ureter. Some of the areas appear to represent pheloboliths. There is an area along the tract of the left at the level of the transverse process at L4 that I feel likely represents stones noted on C.T imaging. These appear to have progressed minimally. Mulitple pelvic calcifications noted. Prominent overlying bowel gas.          ASSESSMENT:      ICD-10 Details  1 GU:   Ureteral calculus - N20.1   2   Renal calculus - N20.0    PLAN:           Orders Labs Urine Culture          Schedule X-Rays: 2 Weeks - KUB  Return Visit/Planned Activity: 2 Weeks - Office Visit, Extender          Document Letter(s):  Created for  Patient: Clinical Summary         Notes:   Urinalysis today is clear. I will send for urine culture, however. KUB today continues to show opacities along the expected anatomical tract of the left ureter consist with ureteral calculi noted on C.T imaging. This appears to have progressed minimally. She continues to have hydronephrosis on RUS. We discussed treatment options in detail today. We discussed continued observation, ESWL, or URS. For ureteroscopy I described the risks which include heart attack, stroke, pulmonary embolus, death, bleeding, infection, damage to contiguous structures, positioning injury, ureteral stricture, ureteral avulsion, ureteral injury, need for ureteral stent, inability to perform ureteroscopy, need for an interval procedure, inability to clear stone burden, stent discomfort and pain. For shockwave lithotripsy I described the risks which include arrhythmia, kidney contusion, kidney hemorrhage, need for transfusion, back discomfort, flank ecchymosis, flank abrasion, inability to break up stone, inability to  pass stone fragments, Steinstrasse, infection associated with obstructing stones, need for different surgical procedure and possible need for repeat shockwave lithotripsy. We discussed that based on location and visibility of her stones that ESWL may not be the best option for her. She would like to consider all of her options. I will discuss with Dr. Junious Silk, as well. I will inform her of these recommendations. I will have her continue MET, for now. I will tentatively plan to have her return in 2 weeks for follow up. Return precautions given in the interim. She voiced understanding.    Modifying factors: There are no other modifying factors  Associated signs and symptoms: There are no other associated signs and symptoms Aggravating and relieving factors: There are no other aggravating or relieving factors Severity: Moderate Duration: Persistent

## 2017-07-21 ENCOUNTER — Ambulatory Visit (HOSPITAL_COMMUNITY)
Admission: RE | Admit: 2017-07-21 | Discharge: 2017-07-21 | Disposition: A | Discharge status: Home / Self Care | Payer: No Typology Code available for payment source | Source: Ambulatory Visit | Attending: Urology | Admitting: Urology

## 2017-07-21 ENCOUNTER — Ambulatory Visit (HOSPITAL_COMMUNITY): Payer: No Typology Code available for payment source

## 2017-07-21 ENCOUNTER — Encounter (HOSPITAL_COMMUNITY)
Admission: RE | Disposition: A | Discharge status: Home / Self Care | Payer: Self-pay | Source: Ambulatory Visit | Attending: Urology

## 2017-07-21 ENCOUNTER — Other Ambulatory Visit: Payer: Self-pay

## 2017-07-21 ENCOUNTER — Encounter (HOSPITAL_COMMUNITY): Payer: Self-pay | Admitting: *Deleted

## 2017-07-21 DIAGNOSIS — Z79899 Other long term (current) drug therapy: Secondary | ICD-10-CM | POA: Diagnosis not present

## 2017-07-21 DIAGNOSIS — Z888 Allergy status to other drugs, medicaments and biological substances status: Secondary | ICD-10-CM | POA: Insufficient documentation

## 2017-07-21 DIAGNOSIS — N132 Hydronephrosis with renal and ureteral calculous obstruction: Secondary | ICD-10-CM | POA: Diagnosis not present

## 2017-07-21 DIAGNOSIS — Z881 Allergy status to other antibiotic agents status: Secondary | ICD-10-CM | POA: Diagnosis not present

## 2017-07-21 DIAGNOSIS — Z885 Allergy status to narcotic agent status: Secondary | ICD-10-CM | POA: Diagnosis not present

## 2017-07-21 DIAGNOSIS — Z87891 Personal history of nicotine dependence: Secondary | ICD-10-CM | POA: Diagnosis not present

## 2017-07-21 DIAGNOSIS — N201 Calculus of ureter: Secondary | ICD-10-CM | POA: Diagnosis present

## 2017-07-21 HISTORY — PX: EXTRACORPOREAL SHOCK WAVE LITHOTRIPSY: SHX1557

## 2017-07-21 SURGERY — LITHOTRIPSY, ESWL
Anesthesia: LOCAL | Laterality: Left

## 2017-07-21 MED ORDER — SODIUM CHLORIDE 0.9 % IV SOLN
INTRAVENOUS | Status: DC
  Administered 2017-07-21: 07:00:00 via INTRAVENOUS

## 2017-07-21 MED ORDER — CIPROFLOXACIN HCL 500 MG PO TABS
500.0000 mg | ORAL_TABLET | ORAL | Status: AC
  Administered 2017-07-21: 500 mg via ORAL
  Filled 2017-07-21: qty 1

## 2017-07-21 MED ORDER — DIPHENHYDRAMINE HCL 25 MG PO CAPS
25.0000 mg | ORAL_CAPSULE | ORAL | Status: AC
  Administered 2017-07-21: 25 mg via ORAL
  Filled 2017-07-21: qty 1

## 2017-07-21 MED ORDER — DIAZEPAM 5 MG PO TABS
10.0000 mg | ORAL_TABLET | ORAL | Status: AC
  Administered 2017-07-21: 10 mg via ORAL
  Filled 2017-07-21: qty 2

## 2017-07-21 MED ORDER — LACTATED RINGERS IV SOLN
INTRAVENOUS | Status: DC
  Administered 2017-07-21: 10:00:00 via INTRAVENOUS

## 2017-07-21 NOTE — Interval H&P Note (Signed)
History and Physical Interval Note:  07/21/2017 8:51 AM  Katie Henry  has presented today for surgery, with the diagnosis of LEFT URETERAL CALCULUS  The various methods of treatment have been discussed with the patient and family. After consideration of risks, benefits and other options for treatment, the patient has consented to  Procedure(s): LEFT EXTRACORPOREAL SHOCK WAVE LITHOTRIPSY (ESWL) (Left) as a surgical intervention .  The patient's history has been reviewed, patient examined, no change in status, stable for surgery.  I have reviewed the patient's chart and labs.  Questions were answered to the patient's satisfaction.     [name]

## 2017-07-21 NOTE — Discharge Instructions (Signed)
Lithotripsy, Care After °This sheet gives you information about how to care for yourself after your procedure. Your health care provider may also give you more specific instructions. If you have problems or questions, contact your health care provider. °What can I expect after the procedure? °After the procedure, it is common to have: °· Some blood in your urine. This should only last for a few days. °· Soreness in your back, sides, or upper abdomen for a few days. °· Blotches or bruises on your back where the pressure wave entered the skin. °· Pain, discomfort, or nausea when pieces (fragments) of the kidney stone move through the tube that carries urine from the kidney to the bladder (ureter). Stone fragments may pass soon after the procedure, but they may continue to pass for up to 4-8 weeks. °? If you have severe pain or nausea, contact your health care provider. This may be caused by a large stone that was not broken up, and this may mean that you need more treatment. °· Some pain or discomfort during urination. °· Some pain or discomfort in the lower abdomen or (in men) at the base of the penis. ° °Follow these instructions at home: °Medicines °· Take over-the-counter and prescription medicines only as told by your health care provider. °· If you were prescribed an antibiotic medicine, take it as told by your health care provider. Do not stop taking the antibiotic even if you start to feel better. °· Do not drive for 24 hours if you were given a medicine to help you relax (sedative). °· Do not drive or use heavy machinery while taking prescription pain medicine. °Eating and drinking °· Drink enough water and fluids to keep your urine clear or pale yellow. This helps any remaining pieces of the stone to pass. It can also help prevent new stones from forming. °· Eat plenty of fresh fruits and vegetables. °· Follow instructions from your health care provider about eating and drinking restrictions. You may be  instructed: °? To reduce how much salt (sodium) you eat or drink. Check ingredients and nutrition facts on packaged foods and beverages. °? To reduce how much meat you eat. °· Eat the recommended amount of calcium for your age and gender. Ask your health care provider how much calcium you should have. °General instructions °· Get plenty of rest. °· Most people can resume normal activities 1-2 days after the procedure. Ask your health care provider what activities are safe for you. °· If directed, strain all urine through the strainer that was provided by your health care provider. °? Keep all fragments for your health care provider to see. Any stones that are found may be sent to a medical lab for examination. The stone may be as small as a grain of salt. °· Keep all follow-up visits as told by your health care provider. This is important. °Contact a health care provider if: °· You have pain that is severe or does not get better with medicine. °· You have nausea that is severe or does not go away. °· You have blood in your urine longer than your health care provider told you to expect. °· You have more blood in your urine. °· You have pain during urination that does not go away. °· You urinate more frequently than usual and this does not go away. °· You develop a rash or any other possible signs of an allergic reaction. °Get help right away if: °· You have severe pain in   your back, sides, or upper abdomen. °· You have severe pain while urinating. °· Your urine is very dark red. °· You have blood in your stool (feces). °· You cannot pass any urine at all. °· You feel a strong urge to urinate after emptying your bladder. °· You have a fever or chills. °· You develop shortness of breath, difficulty breathing, or chest pain. °· You have severe nausea that leads to persistent vomiting. °· You faint. °Summary °· After this procedure, it is common to have some pain, discomfort, or nausea when pieces (fragments) of the  kidney stone move through the tube that carries urine from the kidney to the bladder (ureter). If this pain or nausea is severe, however, you should contact your health care provider. °· Most people can resume normal activities 1-2 days after the procedure. Ask your health care provider what activities are safe for you. °· Drink enough water and fluids to keep your urine clear or pale yellow. This helps any remaining pieces of the stone to pass, and it can help prevent new stones from forming. °· If directed, strain your urine and keep all fragments for your health care provider to see. Fragments or stones may be as small as a grain of salt. °· Get help right away if you have severe pain in your back, sides, or upper abdomen or have severe pain while urinating. °This information is not intended to replace advice given to you by your health care provider. Make sure you discuss any questions you have with your health care provider. °Document Released: 07/25/2007 Document Revised: 05/26/2016 Document Reviewed: 05/26/2016 °Elsevier Interactive Patient Education © 2018 [employer] °Moderate Conscious Sedation, Adult, Care After °These instructions provide you with information about caring for yourself after your procedure. Your health care provider may also give you more specific instructions. Your treatment has been planned according to current medical practices, but problems sometimes occur. Call your health care provider if you have any problems or questions after your procedure. °What can I expect after the procedure? °After your procedure, it is common: °· To feel sleepy for several hours. °· To feel clumsy and have poor balance for several hours. °· To have poor judgment for several hours. °· To vomit if you eat too soon. ° °Follow these instructions at home: °For at least 24 hours after the procedure: ° °· Do not: °? Participate in activities where you could fall or become injured. °? Drive. °? Use heavy  machinery. °? Drink alcohol. °? Take sleeping pills or medicines that cause drowsiness. °? Make important decisions or sign legal documents. °? Take care of children on your own. °· Rest. °Eating and drinking °· Follow the diet recommended by your health care provider. °· If you vomit: °? Drink water, juice, or soup when you can drink without vomiting. °? Make sure you have little or no nausea before eating solid foods. °General instructions °· Have a responsible adult stay with you until you are awake and alert. °· Take over-the-counter and prescription medicines only as told by your health care provider. °· If you smoke, do not smoke without supervision. °· Keep all follow-up visits as told by your health care provider. This is important. °Contact a health care provider if: °· You keep feeling nauseous or you keep vomiting. °· You feel light-headed. °· You develop a rash. °· You have a fever. °Get help right away if: °· You have trouble breathing. °This information is not intended to replace advice   given to you by your health care provider. Make sure you discuss any questions you have with your health care provider. Document Released: 04/25/2013 Document Revised: 12/08/2015 Document Reviewed: 10/25/2015 Elsevier Interactive Patient Education  [employer]. I have reviewed discharge instructions in detail with the patient. They will follow-up with me or their physician as scheduled. My nurse will also be calling the patients as per protocol.

## 2017-07-22 ENCOUNTER — Encounter (HOSPITAL_COMMUNITY): Payer: Self-pay | Admitting: Urology

## 2017-08-04 MED FILL — FLUCONAZOLE 150 MG TABLET: 150 | 1 days supply | Qty: 1 | Fill #0

## 2017-08-05 ENCOUNTER — Other Ambulatory Visit: Payer: Self-pay | Admitting: Urology

## 2017-08-05 NOTE — Patient Instructions (Addendum)
Katie Henry  08/05/2017   Your procedure is scheduled on: 08-09-17  Report to [facility] Main  Entrance  Report to admitting at 815 AM  Call this number if you have problems the morning of surgery [phone]   Remember: Do not eat food or drink liquids :After Midnight.     Take these medicines the morning of surgery with A SIP OF WATER: TOPOMAX, FLONASE NASAL SPRAY IF NEEDED, SYNTHROID, ES TYLENOL IF NEEDED                              You may not have any metal on your body including hair pins and              piercings  Do not wear jewelry, make-up, lotions, powders or perfumes, deodorant             Do not wear nail polish.  Do not shave  48 hours prior to surgery.              Men may shave face and neck.   Do not bring valuables to the hospital. [place].  Contacts, dentures or bridgework may not be worn into surgery.  Leave suitcase in the car. After surgery it may be brought to your room.     Patients discharged the day of surgery will not be allowed to drive home.  Name and phone number of your driver: JAYELYN BARNO CELL [phone]  Special Instructions: N/A              Please read over the following fact sheets you were given: _____________________________________________________________________             [facility] - Preparing for Surgery Before surgery, you can play an important role.  Because skin is not sterile, your skin needs to be as free of germs as possible.  You can reduce the number of germs on your skin by washing with CHG (chlorahexidine gluconate) soap before surgery.  CHG is an antiseptic cleaner which kills germs and bonds with the skin to continue killing germs even after washing. Please DO NOT use if you have an allergy to CHG or antibacterial soaps.  If your skin becomes reddened/irritated stop using the CHG and inform your nurse when you arrive at Short Stay. Do  not shave (including legs and underarms) for at least 48 hours prior to the first CHG shower.  You may shave your face/neck. Please follow these instructions carefully:  1.  Shower with CHG Soap the night before surgery and the  morning of Surgery.  2.  If you choose to wash your hair, wash your hair first as usual with your  normal  shampoo.  3.  After you shampoo, rinse your hair and body thoroughly to remove the  shampoo.                           4.  Use CHG as you would any other liquid soap.  You can apply chg directly  to the skin and wash                       Gently with a  scrungie or clean washcloth.  5.  Apply the CHG Soap to your body ONLY FROM THE NECK DOWN.   Do not use on face/ open                           Wound or open sores. Avoid contact with eyes, ears mouth and genitals (private parts).                       Wash face,  Genitals (private parts) with your normal soap.             6.  Wash thoroughly, paying special attention to the area where your surgery  will be performed.  7.  Thoroughly rinse your body with warm water from the neck down.  8.  DO NOT shower/wash with your normal soap after using and rinsing off  the CHG Soap.                9.  Pat yourself dry with a clean towel.            10.  Wear clean pajamas.            11.  Place clean sheets on your bed the night of your first shower and do not  sleep with pets. Day of Surgery : Do not apply any lotions/deodorants the morning of surgery.  Please wear clean clothes to the hospital/surgery center.  FAILURE TO FOLLOW THESE INSTRUCTIONS MAY RESULT IN THE CANCELLATION OF YOUR SURGERY PATIENT SIGNATURE_________________________________  NURSE SIGNATURE__________________________________  ________________________________________________________________________

## 2017-08-08 ENCOUNTER — Encounter (HOSPITAL_COMMUNITY): Payer: Self-pay

## 2017-08-08 ENCOUNTER — Other Ambulatory Visit: Payer: Self-pay

## 2017-08-08 ENCOUNTER — Encounter (HOSPITAL_COMMUNITY)
Admission: RE | Admit: 2017-08-08 | Discharge: 2017-08-08 | Disposition: A | Discharge status: Home / Self Care | Payer: No Typology Code available for payment source | Source: Ambulatory Visit | Attending: Urology | Admitting: Urology

## 2017-08-08 DIAGNOSIS — Z6834 Body mass index (BMI) 34.0-34.9, adult: Secondary | ICD-10-CM | POA: Diagnosis not present

## 2017-08-08 DIAGNOSIS — Z79899 Other long term (current) drug therapy: Secondary | ICD-10-CM | POA: Diagnosis not present

## 2017-08-08 DIAGNOSIS — Z87891 Personal history of nicotine dependence: Secondary | ICD-10-CM | POA: Diagnosis not present

## 2017-08-08 DIAGNOSIS — E0590 Thyrotoxicosis, unspecified without thyrotoxic crisis or storm: Secondary | ICD-10-CM | POA: Diagnosis not present

## 2017-08-08 DIAGNOSIS — N201 Calculus of ureter: Secondary | ICD-10-CM | POA: Diagnosis present

## 2017-08-08 DIAGNOSIS — E669 Obesity, unspecified: Secondary | ICD-10-CM | POA: Diagnosis not present

## 2017-08-08 DIAGNOSIS — N132 Hydronephrosis with renal and ureteral calculous obstruction: Secondary | ICD-10-CM | POA: Diagnosis not present

## 2017-08-08 DIAGNOSIS — Z7951 Long term (current) use of inhaled steroids: Secondary | ICD-10-CM | POA: Diagnosis not present

## 2017-08-08 DIAGNOSIS — E559 Vitamin D deficiency, unspecified: Secondary | ICD-10-CM | POA: Diagnosis not present

## 2017-08-08 HISTORY — DX: Hypothyroidism, unspecified: E03.9

## 2017-08-08 LAB — CBC
HEMATOCRIT: 41.7 % (ref 36.0–46.0)
HEMOGLOBIN: 13.8 g/dL (ref 12.0–15.0)
MCH: 30.9 pg (ref 26.0–34.0)
MCHC: 33.1 g/dL (ref 30.0–36.0)
MCV: 93.3 fL (ref 78.0–100.0)
Platelets: 384 10*3/uL (ref 150–400)
RBC: 4.47 MIL/uL (ref 3.87–5.11)
RDW: 12.2 % (ref 11.5–15.5)
WBC: 9.6 10*3/uL (ref 4.0–10.5)

## 2017-08-08 NOTE — H&P (Signed)
Office Visit Report     08/03/2017   --------------------------------------------------------------------------------   Katie Henry  MRN: [id number]  PRIMARY CARE:  Penni Homans, MD  DOB: [birth year], 47 year old Female  REFERRING:  Kathlene November, MD  SSN: -[id number]  PROVIDER:  Festus Aloe, M.D.    TREATING:  Azucena Fallen    LOCATION:  [facility] [phone]   --------------------------------------------------------------------------------   CC: I have ureteral stone.  HPI: Katie Henry is a 47 year-old female established patient who is here for ureteral stone.  06/30/17: Patient developed left flank pain June 18, 2017. The pain resolved and then recurred. CT scan was done June 29, 2017 which showed some very small left lower pole stones, hydronephrosis down to 2-3 stones in the left proximal ureter. With the above about 3 mm but stacked up to about 8 mm. It was hard to tell if they were visible on the scout, HU 425). There are also some calcifications in the gonadal vein. Urine pH 6. Changed diet to more protein - nuts and fish.   She's been straining. She been drinking a lot of water. She hasn't started tamsulosin. She works with Dr. Larose Kells. No dysuria or fever.   07/06/17: She returns today for follow up. She continues to complain of intermittent left flank and LLQ pain. Pain is well managed with Tylenol most of the time. She also continues to have intermittent episodes of nausea. She denies any gross hematuria or fevers. She denies exacerbation of her voiding symptoms. She denies seeing a stone pass.   08/03/17: She is s/p left ESWL on 07/21/17. She denies seeing stones pass since her procedure, but she has not been straining her urine with every void. She continues to complain of some intermittent left flank and abdominal pain. She describes this as a pressure sensation. She feels urgency and frequency are stable. She denies fevers, dysuria, gross hematuria, nausea, or  vomiting. She does have some vaginal itching discharged today to begin shortly after her procedure. She is having some intermittent issues with constipation, as well.   The problem is on the left side. She first stated noticing pain on 06/18/2017. This is not her first kidney stone. She is currently having flank pain. She denies having back pain, groin pain, nausea, vomiting, fever, and chills. Pain is occuring on both sides. She has not caught a stone in her urine strainer since her symptoms began.   She has had ureteral stent and ureteroscopy for treatment of her stones in the past.     ALLERGIES: Amoxicillin Codeine Imitrex Ultram    MEDICATIONS: Zyrtec 10 mg tablet  Align 4 mg (1 billion cell) capsule  Fluticasone Propionate  Synthroid 175 mcg tablet  Topiramate 50 mg tablet  Vitamin D3 50,000 unit capsule     GU PSH: Endometrial Ablation ESWL, Left - 07/21/2017 Hysterectomy    NON-GU PSH: Appendectomy Thyroid Surgery    GU PMH: Renal calculus - 07/06/2017, - 06/30/2017 Ureteral calculus - 07/06/2017, - 06/30/2017 Endometriosis, Unspec    NON-GU PMH: Bacteriuria - 06/30/2017 Hyperthyroidism Migraine, unspecified, intractable, without status migrainosus Other irritable bowel syndrome Personal history of other endocrine, nutritional and metabolic disease Vitamin D deficiency, unspecified    FAMILY HISTORY: Death - Father Deceased - Father Heart Disease - Aunt, Grandfather renal failure - Grandmother   SOCIAL HISTORY: Marital Status: Married Preferred Language: English; Ethnicity: Not Hispanic Or Latino; Race: Black or African American Current Smoking Status: Patient does not smoke anymore. Has  not smoked since 06/19/2007. Smoked for 5 years.   Tobacco Use Assessment Completed: Used Tobacco in last 30 days? Drinks 1 drink per week.  Does not drink caffeine. Patient's occupation Tourist information centre manager.     Notes: 2 sons   REVIEW OF SYSTEMS:    GU Review Female:    Patient reports frequent urination and burning /pain with urination. Patient denies hard to postpone urination, get up at night to urinate, leakage of urine, stream starts and stops, trouble starting your stream, have to strain to urinate, and being pregnant.  Gastrointestinal (Upper):   Patient denies nausea, vomiting, and indigestion/ heartburn.  Gastrointestinal (Lower):   Patient denies diarrhea and constipation.  Constitutional:   Patient denies fever, night sweats, weight loss, and fatigue.  Skin:   Patient denies skin rash/ lesion and itching.  Eyes:   Patient denies blurred vision and double vision.  Ears/ Nose/ Throat:   Patient denies sore throat and sinus problems.  Hematologic/Lymphatic:   Patient denies swollen glands and easy bruising.  Cardiovascular:   Patient denies leg swelling and chest pains.  Respiratory:   Patient denies cough and shortness of breath.  Endocrine:   Patient denies excessive thirst.  Musculoskeletal:   Patient denies back pain and joint pain.  Neurological:   Patient denies headaches and dizziness.  Psychologic:   Patient denies depression and anxiety.   VITAL SIGNS:      08/03/2017 09:25 AM  Weight 193 lb / 87.54 kg  Height 63 in / 160.02 cm  BP 146/85 mmHg  Pulse 80 /min  Temperature 98.2 F / 36.7 C  BMI 34.2 kg/m   MULTI-SYSTEM PHYSICAL EXAMINATION:    Constitutional: Well-nourished. No physical deformities. Normally developed. Good grooming.  Respiratory: Normal breath sounds. No labored breathing, no use of accessory muscles.   Cardiovascular: Regular rate and rhythm. No murmur, no gallop. Normal temperature, no swelling, no varicosities.   Skin: No paleness, no jaundice, no cyanosis. No lesion, no ulcer, no rash.  Neurologic / Psychiatric: Oriented to time, oriented to place, oriented to person. No depression, no anxiety, no agitation.  Gastrointestinal: No mass, no tenderness, no rigidity, non obese abdomen.  Musculoskeletal: Spine, ribs,  pelvis no bilateral tenderness. Normal gait and station of head and neck.     PAST DATA REVIEWED:  Source Of History:  Patient  Records Review:   Previous Patient Records  Urine Test Review:   Urinalysis  X-Ray Review: KUB: Reviewed Films.  Renal Ultrasound (Limited): Reviewed Films.  C.T. Stone Protocol: Reviewed Films. Reviewed Report.     PROCEDURES:         Renal Ultrasound (Limited) - 31517  LT Kidney: Length:12.9 cm Depth: 5.2 cm Cortical Width:1.6 cm Width: 5.8 cm    Left Kidney/Ureter:  Hydro noted,? Multiple hyperechoic areas noted, largest= 0.46cm UP  Bladder:  PVR= 1.82               KUB - 74018  A single view of the abdomen is obtained. No obvious opacities noted within the confines of the right renal shadow as well as the anatomical course of the right ureter. Stable opacities noted within the confines of the left renal shadow. Stable phleboliths noted on expected anatomical course of the left ureter consistent with prior imaging. There does appear to be some shadowing overlying the pelvic bone along expected anatomical course of the left ureter today. This appears more prominent when compared to past imaging. Multiple stable pelvic calcifications. Normal overlying bowel gas  pattern.                Urinalysis Dipstick Dipstick Cont'd  Color: Yellow Bilirubin: Neg  Appearance: Clear Ketones: Neg  Specific Gravity: 1.015 Blood: Neg  pH: 7.0 Protein: Neg  Glucose: Neg Urobilinogen: 0.2    Nitrites: Neg    Leukocyte Esterase: Neg    ASSESSMENT:      ICD-10 Details  1 GU:   Ureteral calculus - N20.1   2   Renal calculus - N20.0    PLAN:           Orders Labs Urine Culture  X-Rays: Renal Ultrasound (Limited) - left           Schedule         Document Letter(s):  Created for Patient: Clinical Summary         Notes:   Urinalysis today is clear. There is an area of shadowing along expected anatomical tract of the left ureter overlying the pelvic bone  today that does appear more prominent than past imaging. RUS today continues to show hydronephrosis. I will review the images and discussed case with her urologist. We did discuss that proceeding with ureteroscopy may be necessary. We discussed risk and benefits of that procedure in detail today including: Bleeding, infection, injury to the kidney, injury to ureter, injury to bladder, inability to perform the procedure, need for additional procedures, or general anesthetic complications. We did also discuss the role potentially repeating CT imaging. I will have her continue Tamsulosin and we discussed adequate hydration. We will determine follow-up based on her urologist recommendations. Return precautions discussed in the interim.     * Signed by Azucena Fallen on 08/03/17 at 5:07 PM (EST)*     The information contained in this medical record document is considered private and confidential patient information. This information can only be used for the medical diagnosis and/or medical services that are being provided by the patient's selected caregivers. This information can only be distributed outside of the patient's care if the patient agrees and signs waivers of authorization for this information to be sent to an outside source or route.  Addendum: I reviewed the patient's images, chart and labs.  I discussed with the patient with NP Ronal Fear and agree with her assessment and plan. Urine cx negative.

## 2017-08-09 ENCOUNTER — Ambulatory Visit (HOSPITAL_COMMUNITY): Payer: No Typology Code available for payment source | Admitting: Certified Registered Nurse Anesthetist

## 2017-08-09 ENCOUNTER — Encounter (HOSPITAL_COMMUNITY): Payer: Self-pay | Admitting: Emergency Medicine

## 2017-08-09 ENCOUNTER — Ambulatory Visit (HOSPITAL_COMMUNITY): Payer: No Typology Code available for payment source

## 2017-08-09 ENCOUNTER — Encounter (HOSPITAL_COMMUNITY)
Admission: RE | Disposition: A | Discharge status: Home / Self Care | Payer: Self-pay | Source: Ambulatory Visit | Attending: Urology

## 2017-08-09 ENCOUNTER — Ambulatory Visit (HOSPITAL_COMMUNITY)
Admission: RE | Admit: 2017-08-09 | Discharge: 2017-08-09 | Disposition: A | Discharge status: Home / Self Care | Payer: No Typology Code available for payment source | Source: Ambulatory Visit | Attending: Urology | Admitting: Urology

## 2017-08-09 DIAGNOSIS — Z87891 Personal history of nicotine dependence: Secondary | ICD-10-CM | POA: Insufficient documentation

## 2017-08-09 DIAGNOSIS — Z6834 Body mass index (BMI) 34.0-34.9, adult: Secondary | ICD-10-CM | POA: Insufficient documentation

## 2017-08-09 DIAGNOSIS — E559 Vitamin D deficiency, unspecified: Secondary | ICD-10-CM | POA: Insufficient documentation

## 2017-08-09 DIAGNOSIS — E0590 Thyrotoxicosis, unspecified without thyrotoxic crisis or storm: Secondary | ICD-10-CM | POA: Insufficient documentation

## 2017-08-09 DIAGNOSIS — N132 Hydronephrosis with renal and ureteral calculous obstruction: Secondary | ICD-10-CM | POA: Insufficient documentation

## 2017-08-09 DIAGNOSIS — Z79899 Other long term (current) drug therapy: Secondary | ICD-10-CM | POA: Insufficient documentation

## 2017-08-09 DIAGNOSIS — Z7951 Long term (current) use of inhaled steroids: Secondary | ICD-10-CM | POA: Insufficient documentation

## 2017-08-09 DIAGNOSIS — E669 Obesity, unspecified: Secondary | ICD-10-CM | POA: Insufficient documentation

## 2017-08-09 HISTORY — PX: CYSTOSCOPY/URETEROSCOPY/HOLMIUM LASER/STENT PLACEMENT: SHX6546

## 2017-08-09 SURGERY — CYSTOSCOPY/URETEROSCOPY/HOLMIUM LASER/STENT PLACEMENT
Anesthesia: General | Laterality: Left

## 2017-08-09 MED ORDER — DEXAMETHASONE SODIUM PHOSPHATE 4 MG/ML IJ SOLN
INTRAMUSCULAR | Status: DC | PRN
  Administered 2017-08-09: 10 mg via INTRAVENOUS

## 2017-08-09 MED ORDER — CIPROFLOXACIN IN D5W 400 MG/200ML IV SOLN
400.0000 mg | Freq: Once | INTRAVENOUS | Status: AC
  Administered 2017-08-09: 400 mg via INTRAVENOUS
  Filled 2017-08-09: qty 200

## 2017-08-09 MED ORDER — FENTANYL CITRATE (PF) 100 MCG/2ML IJ SOLN
INTRAMUSCULAR | Status: AC
  Filled 2017-08-09: qty 2

## 2017-08-09 MED ORDER — PROMETHAZINE HCL 25 MG/ML IJ SOLN: 6.2500 mg | INTRAMUSCULAR | Status: DC | PRN

## 2017-08-09 MED ORDER — LACTATED RINGERS IV SOLN
INTRAVENOUS | Status: DC
  Administered 2017-08-09 (×2): via INTRAVENOUS

## 2017-08-09 MED ORDER — PROPOFOL 10 MG/ML IV BOLUS
INTRAVENOUS | Status: AC
  Filled 2017-08-09: qty 20

## 2017-08-09 MED ORDER — SCOPOLAMINE 1 MG/3DAYS TD PT72
MEDICATED_PATCH | TRANSDERMAL | Status: DC | PRN
  Administered 2017-08-09: 1 via TRANSDERMAL

## 2017-08-09 MED ORDER — PROPOFOL 10 MG/ML IV BOLUS
INTRAVENOUS | Status: DC | PRN
  Administered 2017-08-09: 200 mg via INTRAVENOUS
  Administered 2017-08-09: 20 mg via INTRAVENOUS
  Administered 2017-08-09: 30 mg via INTRAVENOUS

## 2017-08-09 MED ORDER — FENTANYL CITRATE (PF) 100 MCG/2ML IJ SOLN: 25.0000 ug | INTRAMUSCULAR | Status: DC | PRN

## 2017-08-09 MED ORDER — MIDAZOLAM HCL 2 MG/2ML IJ SOLN
INTRAMUSCULAR | Status: AC
  Filled 2017-08-09: qty 2

## 2017-08-09 MED ORDER — IOHEXOL 300 MG/ML  SOLN
INTRAMUSCULAR | Status: DC | PRN
  Administered 2017-08-09: 4 mL

## 2017-08-09 MED ORDER — LIDOCAINE 2% (20 MG/ML) 5 ML SYRINGE
INTRAMUSCULAR | Status: AC
  Filled 2017-08-09: qty 5

## 2017-08-09 MED ORDER — FLUCONAZOLE 150 MG PO TABS: 150.0000 mg | ORAL_TABLET | Freq: Once | ORAL | 0 refills | Status: AC

## 2017-08-09 MED ORDER — OXYCODONE HCL 5 MG/5ML PO SOLN: 5.0000 mg | Freq: Once | ORAL | Status: DC | PRN

## 2017-08-09 MED ORDER — MIDAZOLAM HCL 5 MG/5ML IJ SOLN
INTRAMUSCULAR | Status: DC | PRN
  Administered 2017-08-09: 2 mg via INTRAVENOUS

## 2017-08-09 MED ORDER — FENTANYL CITRATE (PF) 100 MCG/2ML IJ SOLN
INTRAMUSCULAR | Status: DC | PRN
  Administered 2017-08-09 (×4): 25 ug via INTRAVENOUS
  Administered 2017-08-09: 50 ug via INTRAVENOUS
  Administered 2017-08-09 (×2): 25 ug via INTRAVENOUS

## 2017-08-09 MED ORDER — OXYCODONE HCL 5 MG PO TABS: 5.0000 mg | ORAL_TABLET | Freq: Once | ORAL | Status: DC | PRN

## 2017-08-09 MED ORDER — LIDOCAINE HCL (CARDIAC) 20 MG/ML IV SOLN
INTRAVENOUS | Status: DC | PRN
  Administered 2017-08-09: 100 mg via INTRAVENOUS

## 2017-08-09 MED ORDER — SODIUM CHLORIDE 0.9 % IR SOLN
Status: DC | PRN
  Administered 2017-08-09: 4000 mL

## 2017-08-09 MED ORDER — DEXAMETHASONE SODIUM PHOSPHATE 10 MG/ML IJ SOLN
INTRAMUSCULAR | Status: AC
  Filled 2017-08-09: qty 1

## 2017-08-09 MED ORDER — NITROFURANTOIN MACROCRYSTAL 100 MG PO CAPS: 100.0000 mg | ORAL_CAPSULE | Freq: Every day | ORAL | 0 refills | Status: AC

## 2017-08-09 MED FILL — FLUCONAZOLE 150 MG TABLET: 150 | 2 days supply | Qty: 2 | Fill #0

## 2017-08-09 MED FILL — NITROFURANTOIN MCR 100 MG C: 100 | 7 days supply | Qty: 7 | Fill #0

## 2017-08-09 SURGICAL SUPPLY — 21 items
BAG URO CATCHER STRL LF (MISCELLANEOUS) ×2 IMPLANT
BASKET ZERO TIP NITINOL 2.4FR (BASKET) ×2 IMPLANT
CATH INTERMIT  6FR 70CM (CATHETERS) IMPLANT
CATH URET 5FR 28IN CONE TIP (BALLOONS) ×1
CATH URET 5FR 70CM CONE TIP (BALLOONS) ×1 IMPLANT
CATH URET WHISTLE 6FR (CATHETERS) IMPLANT
CLOTH BEACON ORANGE TIMEOUT ST (SAFETY) ×2 IMPLANT
COVER FOOTSWITCH UNIV (MISCELLANEOUS) ×2 IMPLANT
COVER SURGICAL LIGHT HANDLE (MISCELLANEOUS) ×2 IMPLANT
FIBER LASER TRAC TIP (UROLOGICAL SUPPLIES) ×2 IMPLANT
GLOVE BIO SURGEON STRL SZ7.5 (GLOVE) ×2 IMPLANT
GOWN STRL REUS W/TWL XL LVL3 (GOWN DISPOSABLE) ×2 IMPLANT
GUIDEWIRE ANG ZIPWIRE 038X150 (WIRE) ×2 IMPLANT
GUIDEWIRE STR DUAL SENSOR (WIRE) ×2 IMPLANT
MANIFOLD NEPTUNE II (INSTRUMENTS) ×2 IMPLANT
PACK CYSTO (CUSTOM PROCEDURE TRAY) ×2 IMPLANT
SHEATH ACCESS URETERAL 24CM (SHEATH) IMPLANT
SHEATH URETERAL 12FRX35CM (MISCELLANEOUS) ×2 IMPLANT
STENT CONTOUR 6FRX26X.038 (STENTS) ×2 IMPLANT
TUBING CONNECTING 10 (TUBING) ×2 IMPLANT
WIRE COONS/BENSON .038X145CM (WIRE) IMPLANT

## 2017-08-09 NOTE — Anesthesia Postprocedure Evaluation (Signed)
Anesthesia Post Note  Patient: Katie Henry  Procedure(s) Performed: CYSTOSCOPY/RETROGRADE/URETEROSCOPY/HOLMIUM LASER/STENT PLACEMENT (Left )     Patient location during evaluation: PACU Anesthesia Type: General Level of consciousness: awake and alert Pain management: pain level controlled Vital Signs Assessment: post-procedure vital signs reviewed and stable Respiratory status: spontaneous breathing, nonlabored ventilation and respiratory function stable Cardiovascular status: blood pressure returned to baseline and stable Postop Assessment: no apparent nausea or vomiting Anesthetic complications: no    Last Vitals:  Vitals:   08/09/17 1130 08/09/17 1147  BP: 120/80 131/89  Pulse: 75 71  Resp: 15 16  Temp: 36.9 C 36.9 C  SpO2: 96% 100%    Last Pain:  Vitals:   08/09/17 1147  TempSrc: Oral  PainSc: 3                  Audry Pili

## 2017-08-09 NOTE — Discharge Instructions (Signed)
You have a nausea patch behind your right ear. Remove it tomorrow around noon. Do not get any ointment on your fingers.       Ureteral Stent Implantation, Care After Refer to this sheet in the next few weeks. These instructions provide you with information about caring for yourself after your procedure. Your health care provider may also give you more specific instructions. Your treatment has been planned according to current medical practices, but problems sometimes occur. Call your health care provider if you have any problems or questions after your procedure.  Removal of the stent: Remove the stent by pulling the string on Monday morning, August 15, 2017  What can I expect after the procedure? After the procedure, it is common to have:  Nausea.  Mild pain when you urinate. You may feel this pain in your lower back or lower abdomen. Pain should stop within a few minutes after you urinate. This may last for up to 1 week.  A small amount of blood in your urine for several days.  Follow these instructions at home:  Medicines  Take over-the-counter and prescription medicines only as told by your health care provider.  If you were prescribed an antibiotic medicine, take it as told by your health care provider. Do not stop taking the antibiotic even if you start to feel better.  Do not drive for 24 hours if you received a sedative.  Do not drive or operate heavy machinery while taking prescription pain medicines. Activity  Return to your normal activities as told by your health care provider. Ask your health care provider what activities are safe for you.  Do not lift anything that is heavier than 10 lb (4.5 kg). Follow this limit for 1 week after your procedure, or for as long as told by your health care provider. General instructions  Watch for any blood in your urine. Call your health care provider if the amount of blood in your urine increases.  If you have a  catheter: ? Follow instructions from your health care provider about taking care of your catheter and collection bag. ? Do not take baths, swim, or use a hot tub until your health care provider approves.  Drink enough fluid to keep your urine clear or pale yellow.  Keep all follow-up visits as told by your health care provider. This is important. Contact a health care provider if:  You have pain that gets worse or does not get better with medicine, especially pain when you urinate.  You have difficulty urinating.  You feel nauseous or you vomit repeatedly during a period of more than 2 days after the procedure. Get help right away if:  Your urine is dark red or has blood clots in it.  You are leaking urine (have incontinence).  The end of the stent comes out of your urethra.  You cannot urinate.  You have sudden, sharp, or severe pain in your abdomen or lower back.  You have a fever. This information is not intended to replace advice given to you by your health care provider. Make sure you discuss any questions you have with your health care provider. Document Released: 03/07/2013 Document Revised: 12/11/2015 Document Reviewed: 01/17/2015 Elsevier Interactive Patient Education  2018 [place] Anesthesia, Adult, Care After These instructions provide you with information about caring for yourself after your procedure. Your health care provider may also give you more specific instructions. Your treatment has been planned according to current medical  practices, but problems sometimes occur. Call your health care provider if you have any problems or questions after your procedure. What can I expect after the procedure? After the procedure, it is common to have:  Vomiting.  A sore throat.  Mental slowness.  It is common to feel:  Nauseous.  Cold or shivery.  Sleepy.  Tired.  Sore or achy, even in parts of your body where you did not have  surgery.  Follow these instructions at home: For at least 24 hours after the procedure:  Do not: ? Participate in activities where you could fall or become injured. ? Drive. ? Use heavy machinery. ? Drink alcohol. ? Take sleeping pills or medicines that cause drowsiness. ? Make important decisions or sign legal documents. ? Take care of children on your own.  Rest. Eating and drinking  If you vomit, drink water, juice, or soup when you can drink without vomiting.  Drink enough fluid to keep your urine clear or pale yellow.  Make sure you have little or no nausea before eating solid foods.  Follow the diet recommended by your health care provider. General instructions  Have a responsible adult stay with you until you are awake and alert.  Return to your normal activities as told by your health care provider. Ask your health care provider what activities are safe for you.  Take over-the-counter and prescription medicines only as told by your health care provider.  If you smoke, do not smoke without supervision.  Keep all follow-up visits as told by your health care provider. This is important. Contact a health care provider if:  You continue to have nausea or vomiting at home, and medicines are not helpful.  You cannot drink fluids or start eating again.  You cannot urinate after 8-12 hours.  You develop a skin rash.  You have fever.  You have increasing redness at the site of your procedure. Get help right away if:  You have difficulty breathing.  You have chest pain.  You have unexpected bleeding.  You feel that you are having a life-threatening or urgent problem. This information is not intended to replace advice given to you by your health care provider. Make sure you discuss any questions you have with your health care provider. Document Released: 10/11/2000 Document Revised: 12/08/2015 Document Reviewed: 06/19/2015 Elsevier Interactive Patient Education   [employer].

## 2017-08-09 NOTE — Anesthesia Procedure Notes (Signed)
Procedure Name: LMA Insertion Date/Time: 08/09/2017 9:49 AM Performed by: Deliah Boston, CRNA Pre-anesthesia Checklist: Patient identified, Emergency Drugs available, Suction available and Patient being monitored Patient Re-evaluated:Patient Re-evaluated prior to induction Oxygen Delivery Method: Circle system utilized Preoxygenation: Pre-oxygenation with 100% oxygen Induction Type: IV induction Ventilation: Mask ventilation without difficulty LMA Size: 4.0 Number of attempts: 1 Placement Confirmation: positive ETCO2 and breath sounds checked- equal and bilateral Tube secured with: Tape Dental Injury: Teeth and Oropharynx as per pre-operative assessment

## 2017-08-09 NOTE — Anesthesia Preprocedure Evaluation (Addendum)
Anesthesia Evaluation  Patient identified by MRN, date of birth, ID band Patient awake    Reviewed: Allergy & Precautions, NPO status , Patient's Chart, lab work & pertinent test results  Airway Mallampati: II  TM Distance: >3 FB Neck ROM: Full    Dental  (+) Dental Advisory Given, Teeth Intact   Pulmonary former smoker,    Pulmonary exam normal breath sounds clear to auscultation       Cardiovascular (-) Past MI negative cardio ROS Normal cardiovascular exam Rhythm:Regular Rate:Normal     Neuro/Psych  Headaches, negative psych ROS   GI/Hepatic Neg liver ROS, neg GERD  ,IBS   Endo/Other  Hypothyroidism Grave's disease s/p radioactive ablation, Obesity  Renal/GU Renal diseaseNephrolithiasis  negative genitourinary   Musculoskeletal negative musculoskeletal ROS (+)   Abdominal (+) + obese,   Peds  Hematology negative hematology ROS (+)   Anesthesia Other Findings   Reproductive/Obstetrics Endometriosis                            Anesthesia Physical Anesthesia Plan  ASA: II  Anesthesia Plan: General   Post-op Pain Management:    Induction: Intravenous  PONV Risk Score and Plan: Treatment may vary due to age or medical condition, Dexamethasone, Midazolam and Scopolamine patch - Pre-op  Airway Management Planned: LMA  Additional Equipment: None  Intra-op Plan:   Post-operative Plan: Extubation in OR  Informed Consent: I have reviewed the patients History and Physical, chart, labs and discussed the procedure including the risks, benefits and alternatives for the proposed anesthesia with the patient or authorized representative who has indicated his/her understanding and acceptance.   Dental advisory given  Plan Discussed with: CRNA  Anesthesia Plan Comments:         Anesthesia Quick Evaluation

## 2017-08-09 NOTE — Transfer of Care (Signed)
Immediate Anesthesia Transfer of Care Note  Patient: Katie Henry  Procedure(s) Performed: CYSTOSCOPY/RETROGRADE/URETEROSCOPY/HOLMIUM LASER/STENT PLACEMENT (Left )  Patient Location: PACU  Anesthesia Type:General  Level of Consciousness: awake, alert , oriented, patient cooperative and responds to stimulation  Airway & Oxygen Therapy: Patient Spontanous Breathing and Patient connected to face mask oxygen  Post-op Assessment: Report given to RN, Post -op Vital signs reviewed and stable and Patient moving all extremities X 4  Post vital signs: Reviewed and stable  Last Vitals:  Vitals:   08/09/17 0826 08/09/17 1105  BP: (!) 136/94   Pulse: 84   Resp: 16   Temp: 36.8 C (P) 36.7 C  SpO2: 100%     Last Pain:  Vitals:   08/09/17 0912  TempSrc:   PainSc: 0-No pain      Patients Stated Pain Goal: 4 (33/82/50 5397)  Complications: No apparent anesthesia complications

## 2017-08-09 NOTE — Op Note (Signed)
Preoperative diagnosis: Left ureteral stone, left renal stone Postoperative diagnosis: Left ureteral stone  Procedure: Cystoscopy with left ureteroscopy, holmium laser lithotripsy, stone basket extraction, ureteral stent placement   Surgeon: Junious Silk   Anesthesia: General  Indication for procedure: 47 year old female with persistent left flank and lower quadrant pain and hydronephrosis following shockwave of the left ureteral stone.  Findings: On cystoscopy there was a stone/stone fragment in the bladder.  This was about 4 mm.  No other stone or foreign body.  Mucosa appeared normal.  Left retrograde pyelogram-this outlined a single system unit.  There is a filling defect in the mid ureter over the sacrum consistent with the stone and mild dilation proximally.  On left ureteroscopy the stone was located in the mid ureter and dusted/fragmented, no stone was noted in the collecting system.  Description of procedure: After consent was obtained the patient brought to the operating room.  After adequate anesthesia she was placed in lithotomy position and prepped and draped in the usual sterile fashion.  A timeout was performed to confirm the patient and procedure.  The cystoscope was passed per urethra and the bladder inspected.  The left ureteral orifice was cannulated with a 6 [place] open-ended catheter and retrograde injection of contrast was performed.  A sensor wire was advanced and coiled in the collecting system.  A semirigid ureteroscope was advanced adjacent to the wire with the stone was located.  It was mostly dusted but 3 fragments remained and were sequentially dropped in the bladder with a 0 tip basket.  Inspection up into the left proximal ureter noted no other stone fragments.  The ureter was quite edematous and erythematous from the stone impaction.  A second Glidewire was advanced.  Over the sensor wire and access sheath was placed.  The digital ureteroscope was then advanced in the  collecting system.  The collecting system was carefully inspected no other stones were noted.  The lower pole stone was not noted.  The collecting system renal pelvis and ureter were inspected on the way out noted to be normal.  The ureter was noted to have no other stone fragment no injury.  Again there was quite some narrowing and edema from the stone impaction.  There was no ureteral injury noted as the access sheath and scope were backed out together.  The wire was backloaded on the cystoscope and a 6 x 26 cm stent was advanced.  The wire was removed with a good coil seen in the kidney and a good coil in the bladder.  The bladder was drained and the scope removed.  She was awakened and taken to the recovery room in stable condition.  Complications: None Blood loss: Minimal Specimens: None  Drains: 6 x 26 cm left ureteral stent with string

## 2017-08-09 NOTE — Interval H&P Note (Signed)
History and Physical Interval Note:  08/09/2017 8:56 AM  Katie Henry  has presented today for surgery, with the diagnosis of LEFT URETERAL STONE  The various methods of treatment have been discussed with the patient and family. After consideration of risks, benefits and other options for treatment, the patient has consented to  Procedure(s) with comments: CYSTOSCOPY/RETROGRADE/URETEROSCOPY/HOLMIUM LASER/STENT PLACEMENT (Left) - ONLY NEEDS 60 MIN as a surgical intervention .  The patient's history has been reviewed, patient examined, no change in status, stable for surgery.  I have reviewed the patient's chart and labs.  Questions were answered to the patient's satisfaction.   Pt with continued LLQ pain and urgency. No fever or dysuria. No further stone/fragement passage. Discussed difficulty with seeing this ureteral stone on KUB, possibility she may have passed all fragment and have no stone in ureter. If possible we may clear the LLP stone. Discussed she may need another staged procedure.    Festus Aloe

## 2017-08-14 ENCOUNTER — Other Ambulatory Visit: Payer: Self-pay

## 2017-08-14 ENCOUNTER — Emergency Department (HOSPITAL_COMMUNITY)
Admission: EM | Admit: 2017-08-14 | Discharge: 2017-08-14 | Disposition: A | Discharge status: Home / Self Care | Payer: No Typology Code available for payment source | Attending: Emergency Medicine | Admitting: Emergency Medicine

## 2017-08-14 ENCOUNTER — Encounter (HOSPITAL_COMMUNITY): Payer: Self-pay | Admitting: Emergency Medicine

## 2017-08-14 ENCOUNTER — Emergency Department (HOSPITAL_COMMUNITY): Payer: No Typology Code available for payment source

## 2017-08-14 DIAGNOSIS — Z87891 Personal history of nicotine dependence: Secondary | ICD-10-CM | POA: Insufficient documentation

## 2017-08-14 DIAGNOSIS — N132 Hydronephrosis with renal and ureteral calculous obstruction: Secondary | ICD-10-CM | POA: Diagnosis not present

## 2017-08-14 DIAGNOSIS — E039 Hypothyroidism, unspecified: Secondary | ICD-10-CM | POA: Insufficient documentation

## 2017-08-14 DIAGNOSIS — R109 Unspecified abdominal pain: Secondary | ICD-10-CM | POA: Diagnosis present

## 2017-08-14 DIAGNOSIS — N390 Urinary tract infection, site not specified: Secondary | ICD-10-CM | POA: Insufficient documentation

## 2017-08-14 DIAGNOSIS — Z79899 Other long term (current) drug therapy: Secondary | ICD-10-CM | POA: Insufficient documentation

## 2017-08-14 LAB — CBC
HCT: 45 % (ref 36.0–46.0)
Hemoglobin: 15.6 g/dL — ABNORMAL HIGH (ref 12.0–15.0)
MCH: 32.4 pg (ref 26.0–34.0)
MCHC: 34.7 g/dL (ref 30.0–36.0)
MCV: 93.4 fL (ref 78.0–100.0)
Platelets: 461 K/uL — ABNORMAL HIGH (ref 150–400)
RBC: 4.82 MIL/uL (ref 3.87–5.11)
RDW: 12.1 % (ref 11.5–15.5)
WBC: 12.5 K/uL — ABNORMAL HIGH (ref 4.0–10.5)

## 2017-08-14 LAB — URINALYSIS, ROUTINE W REFLEX MICROSCOPIC
BILIRUBIN URINE: NEGATIVE
GLUCOSE, UA: 50 mg/dL — AB
Ketones, ur: NEGATIVE mg/dL
NITRITE: NEGATIVE
PROTEIN: 100 mg/dL — AB
Specific Gravity, Urine: 1.008 (ref 1.005–1.030)
pH: 6 (ref 5.0–8.0)

## 2017-08-14 LAB — BASIC METABOLIC PANEL WITH GFR
Anion gap: 7 (ref 5–15)
BUN: 14 mg/dL (ref 6–20)
CO2: 24 mmol/L (ref 22–32)
Calcium: 10 mg/dL (ref 8.9–10.3)
Chloride: 107 mmol/L (ref 101–111)
Creatinine, Ser: 0.65 mg/dL (ref 0.44–1.00)
GFR calc Af Amer: >60 mL/min
GFR calc non Af Amer: >60 mL/min
Glucose, Bld: 104 mg/dL — ABNORMAL HIGH (ref 65–99)
Potassium: 4.5 mmol/L (ref 3.5–5.1)
Sodium: 138 mmol/L (ref 135–145)

## 2017-08-14 LAB — POC URINE PREG, ED: PREG TEST UR: NEGATIVE

## 2017-08-14 MED ORDER — OXYCODONE-ACETAMINOPHEN 5-325 MG PO TABS: 1.0000 | ORAL_TABLET | ORAL | 0 refills | Status: DC | PRN

## 2017-08-14 MED ORDER — KETOROLAC TROMETHAMINE 30 MG/ML IJ SOLN
30.0000 mg | Freq: Once | INTRAMUSCULAR | Status: AC
  Administered 2017-08-14: 30 mg via INTRAVENOUS
  Filled 2017-08-14: qty 1

## 2017-08-14 MED ORDER — HYDROMORPHONE HCL 1 MG/ML IJ SOLN
0.5000 mg | Freq: Once | INTRAMUSCULAR | Status: AC
  Administered 2017-08-14: 0.5 mg via INTRAVENOUS
  Filled 2017-08-14: qty 1

## 2017-08-14 MED ORDER — SODIUM CHLORIDE 0.9 % IV BOLUS (SEPSIS)
500.0000 mL | Freq: Once | INTRAVENOUS | Status: AC
  Administered 2017-08-14: 500 mL via INTRAVENOUS

## 2017-08-14 MED ORDER — DEXTROSE 5 % IV SOLN
2.0000 g | Freq: Once | INTRAVENOUS | Status: AC
  Administered 2017-08-14: 2 g via INTRAVENOUS
  Filled 2017-08-14: qty 2

## 2017-08-14 MED ORDER — PROCHLORPERAZINE MALEATE 10 MG PO TABS: 5.0000 mg | ORAL_TABLET | Freq: Two times a day (BID) | ORAL | 0 refills | Status: DC | PRN

## 2017-08-14 MED ORDER — ONDANSETRON HCL 4 MG/2ML IJ SOLN: 4.0000 mg | Freq: Once | INTRAMUSCULAR | Status: DC

## 2017-08-14 MED ORDER — CIPROFLOXACIN HCL 500 MG PO TABS: 500.0000 mg | ORAL_TABLET | Freq: Two times a day (BID) | ORAL | 0 refills | Status: DC

## 2017-08-14 MED ORDER — PROCHLORPERAZINE EDISYLATE 5 MG/ML IJ SOLN
5.0000 mg | Freq: Once | INTRAMUSCULAR | Status: AC
  Administered 2017-08-14: 5 mg via INTRAVENOUS
  Filled 2017-08-14: qty 2

## 2017-08-14 MED ORDER — SODIUM CHLORIDE 0.9 % IV BOLUS (SEPSIS)
1000.0000 mL | Freq: Once | INTRAVENOUS | Status: AC
  Administered 2017-08-14: 1000 mL via INTRAVENOUS

## 2017-08-14 NOTE — ED Provider Notes (Signed)
[facility] DEPT Provider Note   CSN: [id number] Arrival date & time: 08/14/17  1029     History   Chief Complaint Chief Complaint  Patient presents with  . Abdominal Pain  . Flank Pain    HPI Katie Henry is a 47 y.o. female who presents to the ER with cc of Left flank and suprapubic pain . She has constant pain and pressure . She had a recent lithotripsy on 07/21/2017, she then had to have a cystoscopy and stent placement on the left on 08/09/2017 because she was unable to pass the stones. This morning she was instructed to remove the stent. She did that and around 9:00 Am began having severe and progressively worsening pain in her left flank,. The pain is constant but she has paroxysm of sever pain. She denies fevers, chills, or vomiting.   HPI  Past Medical History:  Diagnosis Date  . Dental infection 05/28/2011  . Endometriosis   . Fainting episodes    Dr Luther Parody related  . Graves disease   . Grief reaction 11/15/2016  . History of kidney stones 2003   onset with pregnacy   . History of UTI    last 4-5-weeks ago  . Hyperglycemia 11/15/2016   cbg 100  . Hyperthyroidism   . 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 IBS (irritable bowel syndrome)   . Migraine   . Syncope   . Vitamin D deficiency     Patient Active Problem List   Diagnosis Date Noted  . Plantar fasciitis 01/11/2017  . Tailor's bunion 01/11/2017  . Grief reaction 11/15/2016  . Hyperglycemia 11/15/2016  . Preventative health care 10/14/2013  . Graves disease   . Endometriosis   . IBS (irritable bowel syndrome)   . Syncope   . Heel pain 07/17/2012  . Migraines 01/13/2012  . Kidney stones 01/13/2012  . Breast cancer screening 07/08/2011  . Vitamin D deficiency 05/23/2011  . Hypothyroidism 05/23/2011    Past Surgical History:  Procedure Laterality Date  .  treatment  1998  . APPENDECTOMY  1991  . CYSTOSCOPY/URETEROSCOPY/HOLMIUM LASER/STENT  PLACEMENT Left 08/09/2017   Procedure: CYSTOSCOPY/RETROGRADE/URETEROSCOPY/HOLMIUM LASER/STENT PLACEMENT;  Surgeon: Festus Aloe, MD;  Location: [facility];  Service: Urology;  Laterality: Left;  ONLY NEEDS 60 MIN  . ENDOMETRIAL ABLATION  01/2007  . EXTRACORPOREAL SHOCK WAVE LITHOTRIPSY Left 07/21/2017   Procedure: LEFT EXTRACORPOREAL SHOCK WAVE LITHOTRIPSY (ESWL);  Surgeon: Bjorn Loser, MD;  Location: [facility];  Service: Urology;  Laterality: Left;  Marland Kitchen VAGINAL HYSTERECTOMY  2010   ovaries still in place    OB History    No data available       Home Medications    Prior to Admission medications   Medication Sig Start Date End Date Taking? Authorizing Provider  acetaminophen (TYLENOL) 500 MG tablet Take 1,000 mg by mouth every 6 (six) hours as needed for mild pain or moderate pain.    [provider]  butalbital-acetaminophen-caffeine (FIORICET, ESGIC) [phone] MG tablet Take 1 tablet by mouth every 4 (four) hours as needed for headache. 11/15/16   [name], MD  cetirizine (ZYRTEC) 10 MG tablet Take 1 tablet (10 mg total) by mouth daily. Patient taking differently: Take 10 mg by mouth daily as needed for allergies.  06/21/16   Shelda Pal, DO  ciprofloxacin (CIPRO) 500 MG tablet Take 1 tablet (500 mg total) by mouth 2 (two) times daily. 08/14/17   Margarita Mail, PA-C  fluticasone (FLONASE) 50 MCG/ACT nasal spray Place 2 sprays into both nostrils daily. 06/21/16   Shelda Pal, DO  ibuprofen (ADVIL,MOTRIN) 200 MG tablet Take 400 mg by mouth every 6 (six) hours as needed for moderate pain.    [provider]  ketorolac (TORADOL) 10 MG tablet Take 1 tablet (10 mg total) by mouth 4 (four) times daily as needed for moderate pain. 06/29/17   Colon Branch, MD  nitrofurantoin (MACRODANTIN) 100 MG capsule Take 1 capsule (100 mg total) by mouth at bedtime for 7 days. 08/09/17 08/16/17  Festus Aloe, MD  oxyCODONE-acetaminophen (PERCOCET) 5-325 MG  tablet Take 1-2 tablets by mouth every 4 (four) hours as needed. 08/14/17   Margarita Mail, PA-C  Probiotic Product (ALIGN) 4 MG CAPS Take 1 capsule by mouth daily. 05/28/11   [name], MD  prochlorperazine (COMPAZINE) 10 MG tablet Take 0.5-1 tablets (5-10 mg total) by mouth 2 (two) times daily as needed for nausea or vomiting (Nausea ). 08/14/17   Margarita Mail, PA-C  SYNTHROID 175 MCG tablet TAKE 1 TABLET BY MOUTH DAILY BEFORE BREAKFAST Patient taking differently: TAKE 175 MCG BY MOUTH DAILY BEFORE BREAKFAST 07/11/17   [name], MD  topiramate (TOPAMAX) 50 MG tablet TAKE 1 TABLET BY MOUTH 2 TIMES DAILY. 07/11/17   [name], MD  Vitamin D, Ergocalciferol, (DRISDOL) 50000 UNITS CAPS capsule Take 1 capsule (50,000 Units total) by mouth every 7 (seven) days. 10/18/14   [name], MD    Family History Family History  Problem Relation Age of Onset  . Hyperlipidemia Mother   . Hyperlipidemia Maternal Grandmother   . Diabetes Maternal Grandmother   . Kidney disease Maternal Grandmother   . Hyperlipidemia Maternal Grandfather   . Heart disease Maternal Grandfather 48       MI  . Prostate cancer Maternal Grandfather   . Hypertension Father   . Dementia Father   . Seizures Father        s/p TBI  . Hypertension Paternal Grandfather   . Graves' disease Sister   . Obesity Brother   . Prostate cancer Unknown        maternal and paternal grandparents  . Heart attack Maternal Aunt        deceased age 59    Social History Social History   Tobacco Use  . Smoking status: Former Smoker    Packs/day: 0.25    Years: 6.00    Pack years: 1.50    Types: Cigarettes  . Smokeless tobacco: Never Used  . Tobacco comment: quit 2010  Substance Use Topics  . Alcohol use: No    Frequency: Never  . Drug use: No     Allergies   Zofran [ondansetron hcl]; Amoxicillin; Codeine; Imitrex [sumatriptan]; and Ultram [tramadol hcl]   Review of Systems Review of Systems Ten  systems reviewed and are negative for acute change, except as noted in the HPI.    Physical Exam Updated Vital Signs BP (!) 125/91   Pulse 67   Temp 98.2 F (36.8 C) (Oral)   Resp 16   Wt 86.2 kg (190 lb)   SpO2 99%   BMI 33.66 kg/m   Physical Exam  Constitutional: She is oriented to person, place, and time. She appears well-developed and well-nourished. No distress.  HENT:  Head: Normocephalic and atraumatic.  Eyes: Conjunctivae and EOM are normal. Pupils are equal, round, and reactive to light. No scleral icterus.  Neck: Normal range of motion.  Cardiovascular: Normal rate, regular rhythm and normal heart sounds. Exam reveals no gallop and no friction rub.  No murmur heard. Pulmonary/Chest: Effort normal and breath sounds normal. No respiratory distress.  Abdominal: Soft. Bowel sounds are normal. She exhibits no distension and no mass. There is no tenderness. There is CVA tenderness (left). There is no guarding.  Neurological: She is alert and oriented to person, place, and time.  Skin: Skin is warm and dry. She is not diaphoretic.  Psychiatric: Her behavior is normal.  Nursing note and vitals reviewed.    ED Treatments / Results  Labs (all labs ordered are listed, but only abnormal results are displayed) Labs Reviewed  URINALYSIS, ROUTINE W REFLEX MICROSCOPIC - Abnormal; Notable for the following components:      Result Value   Color, Urine AMBER (*)    APPearance HAZY (*)    Glucose, UA 50 (*)    Hgb urine dipstick LARGE (*)    Protein, ur 100 (*)    Leukocytes, UA TRACE (*)    Bacteria, UA MANY (*)    Squamous Epithelial / LPF 0-5 (*)    All other components within normal limits  BASIC METABOLIC PANEL - Abnormal; Notable for the following components:   Glucose, Bld 104 (*)    All other components within normal limits  CBC - Abnormal; Notable for the following components:   WBC 12.5 (*)    Hemoglobin 15.6 (*)    Platelets 461 (*)    All other components  within normal limits  URINE CULTURE  POC URINE PREG, ED    EKG  EKG Interpretation None       Radiology US Renal  Result Date: 08/14/2017 CLINICAL DATA:  LEFT flank pain, history of kidney stones, UTI, smoking EXAM: RENAL / URINARY TRACT ULTRASOUND COMPLETE COMPARISON:  08/03/2017 FINDINGS: Right Kidney: Length: 12.6 cm. Normal cortical thickness and echogenicity. Small cyst at upper pole 13 x 16 x 15 mm. No additional mass, hydronephrosis or shadowing calcification. Left Kidney: Length: 14.3 cm. Normal cortical thickness. Question minimally increased cortical echogenicity. Mild hydronephrosis. Shadowing 10 mm calculus at inferior pole. No definite LEFT renal mass. Bladder: Appears normal for degree of bladder distention. IMPRESSION: Mild LEFT hydronephrosis with a nonobstructing 10 mm calculus at inferior pole LEFT kidney. Degree of hydronephrosis appears similar to the previous exam. Small RIGHT renal cyst. Electronically Signed   By: Lavonia Dana M.D.   On: 08/14/2017 16:49    Procedures Procedures (including critical care time)  Medications Ordered in ED Medications  sodium chloride 0.9 % bolus 1,000 mL (0 mLs Intravenous Stopped 08/14/17 1744)  sodium chloride 0.9 % bolus 500 mL (0 mLs Intravenous Stopped 08/14/17 1744)  HYDROmorphone (DILAUDID) injection 0.5 mg (0.5 mg Intravenous Given 08/14/17 1533)  prochlorperazine (COMPAZINE) injection 5 mg (5 mg Intravenous Given 08/14/17 1533)  cefTRIAXone (ROCEPHIN) 2 g in dextrose 5 % 50 mL IVPB (0 g Intravenous Stopped 08/14/17 1629)  ketorolac (TORADOL) 30 MG/ML injection 30 mg (30 mg Intravenous Given 08/14/17 1745)  HYDROmorphone (DILAUDID) injection 0.5 mg (0.5 mg Intravenous Given 08/14/17 1745)     Initial Impression / Assessment and Plan / ED Course  I have reviewed the triage vital signs and the nursing notes.  Pertinent labs & imaging results that were available during my care of the patient were reviewed by me and considered in  my medical decision making (see chart for details).     Patient with apparent urinary tract infection and complicated recent  history involving lithotripsy and retrograde cystoscopy with stent placement which was removed this morning.  I spoke with Dr. Gloriann Loan about the patient and he felt that if we controlled her pain treated her infection she could be discharged to follow-up tomorrow in the clinic.  Patient's pain has been improved with IV pain medicines.  She will be discharged with Percocet and Compazine.  I have advised the patient on return precautions.  She would also be discharged with p.o. antibiotics and close follow-up with her urologist.  She is afebrile and hemodynamically stable.  I reviewed her labs and she does not show any evidence of kidney injury.  She appears appropriate for discharge at this time  Final Clinical Impressions(s) / ED Diagnoses   Final diagnoses:  Flank pain  Urinary tract infection without hematuria, site unspecified    ED Discharge Orders        Ordered    oxyCODONE-acetaminophen (PERCOCET) 5-325 MG tablet  Every 4 hours PRN     08/14/17 1919    prochlorperazine (COMPAZINE) 10 MG tablet  2 times daily PRN     08/14/17 1919    ciprofloxacin (CIPRO) 500 MG tablet  2 times daily     08/14/17 1928       Margarita Mail, PA-C 08/14/17 2214    Virgel Manifold, MD 08/14/17 2252

## 2017-08-14 NOTE — Discharge Instructions (Signed)
Contact a health care provider if: °You have back pain. °You have a fever. °You feel nauseous or vomit. °Your symptoms do not get better after 3 days. °Your symptoms go away and then return. °Get help right away if: °You have severe back pain or lower abdominal pain. °You are vomiting and cannot keep down any medicines or water. °

## 2017-08-14 NOTE — ED Triage Notes (Signed)
Pt stated that she surgical removal of kidney stones on 1/22. Stent was in place until this am. Pt removed uretal stent at 8am this am as directed. Initial response was no pain or bleeding. Sharp pain in l/flank and l/abdomen started 2 hours ago. Pain radiates to l/groin area. Pt was tx by [facility]-Dr Enid Derry

## 2017-08-16 LAB — URINE CULTURE

## 2017-10-31 MED FILL — SYNTHROID 175 MCG TABLET: 175 | 30 days supply | Qty: 30 | Fill #1

## 2017-12-02 MED FILL — SYNTHROID 175 MCG TABLET: 175 | 30 days supply | Qty: 30 | Fill #2

## 2017-12-13 ENCOUNTER — Ambulatory Visit (INDEPENDENT_AMBULATORY_CARE_PROVIDER_SITE_OTHER): Payer: No Typology Code available for payment source | Admitting: Family Medicine

## 2017-12-13 ENCOUNTER — Encounter: Payer: Self-pay | Admitting: Family Medicine

## 2017-12-13 ENCOUNTER — Encounter: Payer: No Typology Code available for payment source | Admitting: Family Medicine

## 2017-12-13 VITALS — BP 118/82 | HR 75 | Temp 97.7°F | Resp 18 | Ht 63.0 in | Wt 180.0 lb

## 2017-12-13 DIAGNOSIS — Z01419 Encounter for gynecological examination (general) (routine) without abnormal findings: Secondary | ICD-10-CM | POA: Diagnosis not present

## 2017-12-13 DIAGNOSIS — G43909 Migraine, unspecified, not intractable, without status migrainosus: Secondary | ICD-10-CM | POA: Diagnosis not present

## 2017-12-13 DIAGNOSIS — E039 Hypothyroidism, unspecified: Secondary | ICD-10-CM

## 2017-12-13 DIAGNOSIS — E785 Hyperlipidemia, unspecified: Secondary | ICD-10-CM | POA: Diagnosis not present

## 2017-12-13 DIAGNOSIS — Z0000 Encounter for general adult medical examination without abnormal findings: Secondary | ICD-10-CM

## 2017-12-13 DIAGNOSIS — R739 Hyperglycemia, unspecified: Secondary | ICD-10-CM | POA: Diagnosis not present

## 2017-12-13 DIAGNOSIS — E559 Vitamin D deficiency, unspecified: Secondary | ICD-10-CM

## 2017-12-13 DIAGNOSIS — N200 Calculus of kidney: Secondary | ICD-10-CM | POA: Diagnosis not present

## 2017-12-13 NOTE — Assessment & Plan Note (Signed)
Encouraged heart healthy diet, increase exercise, avoid trans fats, consider a krill oil cap daily 

## 2017-12-13 NOTE — Assessment & Plan Note (Signed)
hgba1c acceptable, minimize simple carbs. Increase exercise as tolerated. C 

## 2017-12-13 NOTE — Assessment & Plan Note (Signed)
Taking weekly Vitamin D check level today

## 2017-12-13 NOTE — Assessment & Plan Note (Addendum)
Had a bad flare in January has been better since then. Maintain adeaquate hydration and has quit the Topamax. Following with [facility] every 6 months from now.

## 2017-12-13 NOTE — Assessment & Plan Note (Signed)
On Levothyroxine, continue to monitor 

## 2017-12-13 NOTE — Assessment & Plan Note (Signed)
Has stopped the Topamax and has not had flared secondary to dietary changes and increased hydration. No changes

## 2017-12-13 NOTE — Progress Notes (Signed)
Subjective:  I acted as a Education administrator for Dr. Charlett Blake. [place], [place]  Patient ID: Katie Henry, female    DOB: 1971-06-14, 47 y.o.   MRN: [id number]  No chief complaint on file.   HPI  Patient is in today for an annual exam and follow up on chronic medical concerns. Is following closely with urology status post a bad bout with kidney stones earlier this year.  She has been hydrating better and has stopped her Topamax and has had no recurrence of kidney stones.  Has follow-up with [facility].  Is hydrating well and started to avoid some triggers that she realizes spiked her migraines and as a result her migraines are nonexistent despite stopping her Topamax.  No recent febrile illness or acute hospitalizations.  Her sons and her husband's health are greatly improved her stress is improving.  She has started weight watchers and has lost 20 pounds and feels well.  Is walking regularly. Denies CP/palp/SOB/HA/congestion/fevers/GI or GU c/o. Taking meds as prescribed  Patient Care Team: [name], MD as PCP - General (Family Medicine)   Past Medical History:  Diagnosis Date  . Dental infection 05/28/2011  . Endometriosis   . Fainting episodes    Dr Luther Parody related  . Graves disease   . Grief reaction 11/15/2016  . History of kidney stones 2003   onset with pregnacy   . History of UTI    last 4-5-weeks ago  . Hyperglycemia 11/15/2016   cbg 100  . Hyperthyroidism   . 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 IBS (irritable bowel syndrome)   . Migraine   . Syncope   . Vitamin D deficiency     Past Surgical History:  Procedure Laterality Date  .  treatment  1998  . APPENDECTOMY  1991  . CYSTOSCOPY/URETEROSCOPY/HOLMIUM LASER/STENT PLACEMENT Left 08/09/2017   Procedure: CYSTOSCOPY/RETROGRADE/URETEROSCOPY/HOLMIUM LASER/STENT PLACEMENT;  Surgeon: Festus Aloe, MD;  Location: [facility];  Service: Urology;  Laterality: Left;  ONLY NEEDS 60 MIN  . ENDOMETRIAL  ABLATION  01/2007  . EXTRACORPOREAL SHOCK WAVE LITHOTRIPSY Left 07/21/2017   Procedure: LEFT EXTRACORPOREAL SHOCK WAVE LITHOTRIPSY (ESWL);  Surgeon: Bjorn Loser, MD;  Location: [facility];  Service: Urology;  Laterality: Left;  Marland Kitchen VAGINAL HYSTERECTOMY  2010   ovaries still in place    Family History  Problem Relation Age of Onset  . Hyperlipidemia Mother   . Hyperlipidemia Maternal Grandmother   . Diabetes Maternal Grandmother   . Kidney disease Maternal Grandmother   . Hyperlipidemia Maternal Grandfather   . Heart disease Maternal Grandfather 43       MI  . Prostate cancer Maternal Grandfather   . Hypertension Father   . Dementia Father   . Seizures Father        s/p TBI  . Hypertension Paternal Grandfather   . Graves' disease Sister   . Obesity Brother   . Prostate cancer Unknown        maternal and paternal grandparents  . Heart attack Maternal Aunt        deceased age 24    Social History   Socioeconomic History  . Marital status: Married    Spouse name: Not on file  . Number of children: 2  . Years of education: 5  . Highest education level: Not on file  Occupational History    Employer: [employer]  . Financial resource strain: Not on file  . Food insecurity:  Worry: Not on file    Inability: Not on file  . Transportation needs:    Medical: Not on file    Non-medical: Not on file  Tobacco Use  . Smoking status: Former Smoker    Packs/day: 0.25    Years: 6.00    Pack years: 1.50    Types: Cigarettes  . Smokeless tobacco: Never Used  . Tobacco comment: quit 2010  Substance and Sexual Activity  . Alcohol use: No    Frequency: Never  . Drug use: No  . Sexual activity: Yes    Partners: Male    Comment: work at [employer], lives with fiance and son  Lifestyle  . Physical activity:    Days per week: Not on file    Minutes per session: Not on file  . Stress: Not on file  Relationships  . Social connections:    Talks on phone: Not on  file    Gets together: Not on file    Attends religious service: Not on file    Active member of club or organization: Not on file    Attends meetings of clubs or organizations: Not on file    Relationship status: Not on file  . Intimate partner violence:    Fear of current or ex partner: Not on file    Emotionally abused: Not on file    Physically abused: Not on file    Forced sexual activity: Not on file  Other Topics Concern  . Not on file  Social History Narrative   Engaged, two sons.   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   No Tob, occ alcohol, no drugs.    Outpatient Medications Prior to Visit  Medication Sig Dispense Refill  . acetaminophen (TYLENOL) 500 MG tablet Take 1,000 mg by mouth every 6 (six) hours as needed for mild pain or moderate pain.    . butalbital-acetaminophen-caffeine (FIORICET, ESGIC) 50-325-40 MG tablet Take 1 tablet by mouth every 4 (four) hours as needed for headache. 14 tablet 0  . cetirizine (ZYRTEC) 10 MG tablet Take 1 tablet (10 mg total) by mouth daily. (Patient taking differently: Take 10 mg by mouth daily as needed for allergies. ) 30 tablet 0  . ciprofloxacin (CIPRO) 500 MG tablet Take 1 tablet (500 mg total) by mouth 2 (two) times daily. 14 tablet 0  . fluticasone (FLONASE) 50 MCG/ACT nasal spray Place 2 sprays into both nostrils daily. 16 g 6  . ibuprofen (ADVIL,MOTRIN) 200 MG tablet Take 400 mg by mouth every 6 (six) hours as needed for moderate pain.    Marland Kitchen ketorolac (TORADOL) 10 MG tablet Take 1 tablet (10 mg total) by mouth 4 (four) times daily as needed for moderate pain. 20 tablet 0  . oxyCODONE-acetaminophen (PERCOCET) 5-325 MG tablet Take 1-2 tablets by mouth every 4 (four) hours as needed. 20 tablet 0  . Probiotic Product (ALIGN) 4 MG CAPS Take 1 capsule by mouth daily. 30 capsule 0  . prochlorperazine (COMPAZINE) 10 MG tablet Take 0.5-1 tablets (5-10 mg total) by mouth 2 (two) times daily as needed  for nausea or vomiting (Nausea ). 10 tablet 0  . SYNTHROID 175 MCG tablet TAKE 1 TABLET BY MOUTH DAILY BEFORE BREAKFAST (Patient taking differently: TAKE 175 MCG BY MOUTH DAILY BEFORE BREAKFAST) 90 tablet PRN  . topiramate (TOPAMAX) 50 MG tablet TAKE 1 TABLET BY MOUTH 2 TIMES DAILY. 60 tablet PRN  . Vitamin D, Ergocalciferol, (DRISDOL) 50000 UNITS  CAPS capsule Take 1 capsule (50,000 Units total) by mouth every 7 (seven) days. 4 capsule 3   No facility-administered medications prior to visit.     Allergies  Allergen Reactions  . Zofran [Ondansetron Hcl] Other (See Comments)    Pt has hx of migraines; medication increases headaches and nausea   . Amoxicillin Nausea And Vomiting and Other (See Comments)    Has patient had a PCN reaction causing immediate rash, facial/tongue/throat swelling, SOB or lightheadedness with hypotension: Unknown Has patient had a PCN reaction causing severe rash involving mucus membranes or skin necrosis: No Has patient had a PCN reaction that required hospitalization: No Has patient had a PCN reaction occurring within the last 10 years: No If all of the above answers are "NO", then may proceed with Cephalosporin use.   . Codeine Nausea And Vomiting  . Imitrex [Sumatriptan] Nausea And Vomiting  . Ultram [Tramadol Hcl] Nausea And Vomiting    Review of Systems  Constitutional: Negative for chills, fever and malaise/fatigue.  HENT: Negative for congestion and hearing loss.   Eyes: Negative for discharge.  Respiratory: Negative for cough, sputum production and shortness of breath.   Cardiovascular: Negative for chest pain, palpitations and leg swelling.  Gastrointestinal: Negative for abdominal pain, blood in stool, constipation, diarrhea, heartburn, nausea and vomiting.  Genitourinary: Negative for dysuria, frequency, hematuria and urgency.  Musculoskeletal: Negative for back pain, falls and myalgias.  Skin: Negative for rash.  Neurological: Negative for  dizziness, sensory change, loss of consciousness, weakness and headaches.  Endo/Heme/Allergies: Negative for environmental allergies. Does not bruise/bleed easily.  Psychiatric/Behavioral: Negative for depression and suicidal ideas. The patient is not nervous/anxious and does not have insomnia.        Objective:    Physical Exam  Constitutional: She is oriented to person, place, and time. No distress.  HENT:  Head: Normocephalic and atraumatic.  Right Ear: External ear normal.  Left Ear: External ear normal.  Nose: Nose normal.  Mouth/Throat: Oropharynx is clear and moist. No oropharyngeal exudate.  Eyes: Pupils are equal, round, and reactive to light. Conjunctivae are normal. Right eye exhibits no discharge. Left eye exhibits no discharge. No scleral icterus.  Neck: Normal range of motion. Neck supple. No thyromegaly present.  Cardiovascular: Normal rate, regular rhythm, normal heart sounds and intact distal pulses.  No murmur heard. Pulmonary/Chest: Effort normal and breath sounds normal. No respiratory distress. She has no wheezes. She has no rales.  Abdominal: Soft. Bowel sounds are normal. She exhibits no distension and no mass. There is no tenderness.  Musculoskeletal: Normal range of motion. She exhibits no edema or tenderness.  Lymphadenopathy:    She has no cervical adenopathy.  Neurological: She is alert and oriented to person, place, and time. She has normal reflexes. She displays normal reflexes. No cranial nerve deficit. Coordination normal.  Skin: Skin is warm and dry. No rash noted. She is not diaphoretic.    There were no vitals taken for this visit. Wt Readings from Last 3 Encounters:  08/14/17 190 lb (86.2 kg)  08/09/17 192 lb (87.1 kg)  08/08/17 192 lb 9.6 oz (87.4 kg)   BP Readings from Last 3 Encounters:  08/14/17 (!) 125/91  08/09/17 (!) 136/91  08/08/17 (!) 137/91     Immunization History  Administered Date(s) Administered  . Influenza Split  04/04/2012, 04/18/2017  . Influenza-Unspecified 04/18/2013, 05/17/2015, 05/13/2016    Health Maintenance  Topic Date Due  . HIV Screening  01/05/1986  . PAP SMEAR  12/04/2011  . INFLUENZA VACCINE  02/16/2018  . TETANUS/TDAP  04/18/2021    Lab Results  Component Value Date   WBC 12.5 (H) 08/14/2017   HGB 15.6 (H) 08/14/2017   HCT 45.0 08/14/2017   PLT 461 (H) 08/14/2017   GLUCOSE 104 (H) 08/14/2017   CHOL 185 11/12/2016   TRIG 121.0 11/12/2016   HDL 53.80 11/12/2016   LDLCALC 107 (H) 11/12/2016   ALT 19 11/12/2016   AST 12 11/12/2016   NA 138 08/14/2017   K 4.5 08/14/2017   CL 107 08/14/2017   CREATININE 0.65 08/14/2017   BUN 14 08/14/2017   CO2 24 08/14/2017   TSH 0.80 11/12/2016   HGBA1C 5.0 11/12/2016    Lab Results  Component Value Date   TSH 0.80 11/12/2016   Lab Results  Component Value Date   WBC 12.5 (H) 08/14/2017   HGB 15.6 (H) 08/14/2017   HCT 45.0 08/14/2017   MCV 93.4 08/14/2017   PLT 461 (H) 08/14/2017   Lab Results  Component Value Date   NA 138 08/14/2017   K 4.5 08/14/2017   CO2 24 08/14/2017   GLUCOSE 104 (H) 08/14/2017   BUN 14 08/14/2017   CREATININE 0.65 08/14/2017   BILITOT 0.4 11/12/2016   ALKPHOS 72 11/12/2016   AST 12 11/12/2016   ALT 19 11/12/2016   PROT 7.0 11/12/2016   ALBUMIN 4.0 11/12/2016   CALCIUM 10.0 08/14/2017   ANIONGAP 7 08/14/2017   GFR 119.54 06/29/2017   Lab Results  Component Value Date   CHOL 185 11/12/2016   Lab Results  Component Value Date   HDL 53.80 11/12/2016   Lab Results  Component Value Date   LDLCALC 107 (H) 11/12/2016   Lab Results  Component Value Date   TRIG 121.0 11/12/2016   Lab Results  Component Value Date   CHOLHDL 3 11/12/2016   Lab Results  Component Value Date   HGBA1C 5.0 11/12/2016         Assessment & Plan:   Problem List Items Addressed This Visit    None      I am having Rachael M. Wyne maintain her ALIGN, Vitamin D (Ergocalciferol), fluticasone,  cetirizine, butalbital-acetaminophen-caffeine, ketorolac, topiramate, SYNTHROID, ibuprofen, acetaminophen, oxyCODONE-acetaminophen, prochlorperazine, and ciprofloxacin.  No orders of the defined types were placed in this encounter.   CMA served as Education administrator during this visit. History, Physical and Plan performed by medical provider. Documentation and orders reviewed and attested to.  Magdalene Molly, [place]

## 2017-12-14 LAB — COMPREHENSIVE METABOLIC PANEL
ALT: 17 IU/L (ref 0–32)
AST: 16 IU/L (ref 0–40)
Albumin/Globulin Ratio: 1.6 (ref 1.2–2.2)
Albumin: 4.2 g/dL (ref 3.5–5.5)
Alkaline Phosphatase: 84 IU/L (ref 39–117)
BUN/Creatinine Ratio: 15 (ref 9–23)
BUN: 9 mg/dL (ref 6–24)
Bilirubin Total: 0.5 mg/dL (ref 0.0–1.2)
CALCIUM: 9.6 mg/dL (ref 8.7–10.2)
CHLORIDE: 99 mmol/L (ref 96–106)
CO2: 22 mmol/L (ref 20–29)
Creatinine, Ser: 0.62 mg/dL (ref 0.57–1.00)
GFR, EST AFRICAN AMERICAN: 125 mL/min/{1.73_m2} (ref >59)
GFR, EST NON AFRICAN AMERICAN: 108 mL/min/{1.73_m2} (ref >59)
GLUCOSE: 81 mg/dL (ref 65–99)
Globulin, Total: 2.6 g/dL (ref 1.5–4.5)
Potassium: 4.4 mmol/L (ref 3.5–5.2)
Sodium: 137 mmol/L (ref 134–144)
TOTAL PROTEIN: 6.8 g/dL (ref 6.0–8.5)

## 2017-12-14 LAB — TSH: TSH: 0.75 u[IU]/mL (ref 0.450–4.500)

## 2017-12-14 LAB — CBC
HEMOGLOBIN: 14 g/dL (ref 11.1–15.9)
Hematocrit: 43.9 % (ref 34.0–46.6)
MCH: 30 pg (ref 26.6–33.0)
MCHC: 31.9 g/dL (ref 31.5–35.7)
MCV: 94 fL (ref 79–97)
Platelets: 437 10*3/uL (ref 150–450)
RBC: 4.67 x10E6/uL (ref 3.77–5.28)
RDW: 12.5 % (ref 12.3–15.4)
WBC: 10 10*3/uL (ref 3.4–10.8)

## 2017-12-14 LAB — LIPID PANEL
CHOLESTEROL TOTAL: 197 mg/dL (ref 100–199)
Chol/HDL Ratio: 3.1 ratio (ref 0.0–4.4)
HDL: 63 mg/dL (ref >39)
LDL Calculated: 104 mg/dL — ABNORMAL HIGH (ref 0–99)
TRIGLYCERIDES: 151 mg/dL — AB (ref 0–149)
VLDL Cholesterol Cal: 30 mg/dL (ref 5–40)

## 2017-12-14 LAB — VITAMIN D 25 HYDROXY (VIT D DEFICIENCY, FRACTURES): VIT D 25 HYDROXY: 26.8 ng/mL — AB (ref 30.0–100.0)

## 2017-12-14 LAB — HEMOGLOBIN A1C
ESTIMATED AVERAGE GLUCOSE: 100 mg/dL
Hgb A1c MFr Bld: 5.1 % (ref 4.8–5.6)

## 2017-12-15 MED ORDER — VITAMIN D (ERGOCALCIFEROL) 1.25 MG (50000 UNIT) PO CAPS: 50000.0000 [IU] | ORAL_CAPSULE | ORAL | 4 refills | Status: DC

## 2017-12-15 MED FILL — VIT D2 1.25 MG (50,000 UNIT: 1.25 MG | 28 days supply | Qty: 4 | Fill #0

## 2017-12-15 NOTE — Addendum Note (Signed)
Addended by: Bartholome Bill on: 12/15/2017 11:09 AM   Modules accepted: Orders

## 2017-12-16 ENCOUNTER — Telehealth: Payer: Self-pay | Admitting: *Deleted

## 2017-12-16 NOTE — Telephone Encounter (Signed)
Received Lab Report results from [facility]; forwarded to provider/SLS 05/31

## 2017-12-19 ENCOUNTER — Other Ambulatory Visit: Payer: Self-pay

## 2018-01-04 ENCOUNTER — Encounter: Payer: Self-pay | Admitting: Obstetrics and Gynecology

## 2018-01-04 ENCOUNTER — Ambulatory Visit (INDEPENDENT_AMBULATORY_CARE_PROVIDER_SITE_OTHER): Payer: No Typology Code available for payment source | Admitting: Obstetrics and Gynecology

## 2018-01-04 VITALS — BP 123/82 | HR 81 | Ht 63.0 in | Wt 186.0 lb

## 2018-01-04 DIAGNOSIS — N951 Menopausal and female climacteric states: Secondary | ICD-10-CM

## 2018-01-04 DIAGNOSIS — Z01419 Encounter for gynecological examination (general) (routine) without abnormal findings: Secondary | ICD-10-CM

## 2018-01-04 DIAGNOSIS — N898 Other specified noninflammatory disorders of vagina: Secondary | ICD-10-CM

## 2018-01-04 NOTE — Progress Notes (Signed)
GYNECOLOGY ANNUAL PREVENTATIVE CARE ENCOUNTER NOTE  Subjective:   Katie Henry is a 47 y.o.  female here for a annual gynecologic exam. Current complaints: none . She does report occasional vaginal dryness with intercourse that improves with lubrication use. One episode of night sweats and occasional hot flashes. These do not bother her much.  Denies abnormal vaginal bleeding, discharge, pelvic pain, problems with intercourse or other gynecologic concerns.    Gynecologic History No LMP recorded. Patient has had a hysterectomy. Contraception: status post hysterectomy S/p vag hyst 2010 with LGSIL on path Last mammogram: 03/2017. Results were: negative  Obstetric History OB History  No data available    Past Medical History:  Diagnosis Date  . Dental infection 05/28/2011  . Endometriosis   . Fainting episodes    Dr Luther Parody related  . Graves disease   . Grief reaction 11/15/2016  . History of kidney stones 2003   onset with pregnacy   . History of UTI    last 4-5-weeks ago  . Hyperglycemia 11/15/2016   cbg 100  . Hyperthyroidism   . 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 IBS (irritable bowel syndrome)   . Migraine   . Syncope   . Vitamin D deficiency     Past Surgical History:  Procedure Laterality Date  .  treatment  1998  . APPENDECTOMY  1991  . CYSTOSCOPY/URETEROSCOPY/HOLMIUM LASER/STENT PLACEMENT Left 08/09/2017   Procedure: CYSTOSCOPY/RETROGRADE/URETEROSCOPY/HOLMIUM LASER/STENT PLACEMENT;  Surgeon: Festus Aloe, MD;  Location: [facility];  Service: Urology;  Laterality: Left;  ONLY NEEDS 60 MIN  . ENDOMETRIAL ABLATION  01/2007  . EXTRACORPOREAL SHOCK WAVE LITHOTRIPSY Left 07/21/2017   Procedure: LEFT EXTRACORPOREAL SHOCK WAVE LITHOTRIPSY (ESWL);  Surgeon: Bjorn Loser, MD;  Location: [facility];  Service: Urology;  Laterality: Left;  Marland Kitchen VAGINAL HYSTERECTOMY  2010   ovaries still in place    Current Outpatient Medications on File Prior to Visit    Medication Sig Dispense Refill  . acetaminophen (TYLENOL) 500 MG tablet Take 1,000 mg by mouth every 6 (six) hours as needed for mild pain or moderate pain.    . cetirizine (ZYRTEC) 10 MG tablet Take 1 tablet (10 mg total) by mouth daily. (Patient taking differently: Take 10 mg by mouth daily as needed for allergies. ) 30 tablet 0  . fluticasone (FLONASE) 50 MCG/ACT nasal spray Place 2 sprays into both nostrils daily. 16 g 6  . SYNTHROID 175 MCG tablet TAKE 1 TABLET BY MOUTH DAILY BEFORE BREAKFAST (Patient taking differently: TAKE 175 MCG BY MOUTH DAILY BEFORE BREAKFAST) 90 tablet PRN  . Vitamin D, Ergocalciferol, (DRISDOL) 50000 units CAPS capsule Take 1 capsule (50,000 Units total) by mouth every 7 (seven) days. 4 capsule 4   No current facility-administered medications on file prior to visit.     Allergies  Allergen Reactions  . Zofran [Ondansetron Hcl] Other (See Comments)    Pt has hx of migraines; medication increases headaches and nausea   . Amoxicillin Nausea And Vomiting and Other (See Comments)    Has patient had a PCN reaction causing immediate rash, facial/tongue/throat swelling, SOB or lightheadedness with hypotension: Unknown Has patient had a PCN reaction causing severe rash involving mucus membranes or skin necrosis: No Has patient had a PCN reaction that required hospitalization: No Has patient had a PCN reaction occurring within the last 10 years: No If all of the above answers are "NO", then may proceed with Cephalosporin use.   . Codeine Nausea  And Vomiting  . Imitrex [Sumatriptan] Nausea And Vomiting  . Ultram [Tramadol Hcl] Nausea And Vomiting    Social History   Socioeconomic History  . Marital status: Married    Spouse name: Not on file  . Number of children: 2  . Years of education: 40  . Highest education level: Not on file  Occupational History    Employer: [employer]  . Financial resource strain: Not on file  . Food insecurity:     Worry: Not on file    Inability: Not on file  . Transportation needs:    Medical: Not on file    Non-medical: Not on file  Tobacco Use  . Smoking status: Former Smoker    Packs/day: 0.25    Years: 6.00    Pack years: 1.50    Types: Cigarettes  . Smokeless tobacco: Never Used  . Tobacco comment: quit 2010  Substance and Sexual Activity  . Alcohol use: No    Frequency: Never  . Drug use: No  . Sexual activity: Yes    Partners: Male    Comment: work at [employer], lives with fiance and son  Lifestyle  . Physical activity:    Days per week: Not on file    Minutes per session: Not on file  . Stress: Not on file  Relationships  . Social connections:    Talks on phone: Not on file    Gets together: Not on file    Attends religious service: Not on file    Active member of club or organization: Not on file    Attends meetings of clubs or organizations: Not on file    Relationship status: Not on file  . Intimate partner violence:    Fear of current or ex partner: Not on file    Emotionally abused: Not on file    Physically abused: Not on file    Forced sexual activity: Not on file  Other Topics Concern  . Not on file  Social History Narrative   Engaged, two sons.   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   No Tob, occ alcohol, no drugs.    Family History  Problem Relation Age of Onset  . Hyperlipidemia Mother   . Heart disease Mother        bicuspid mitral valve  . Hyperlipidemia Maternal Grandmother   . Diabetes Maternal Grandmother   . Kidney disease Maternal Grandmother   . Hyperlipidemia Maternal Grandfather   . Heart disease Maternal Grandfather 56       MI  . Prostate cancer Maternal Grandfather   . Hypertension Father   . Dementia Father   . Seizures Father        s/p TBI  . Hypertension Paternal Grandfather   . Graves' disease Sister   . Obesity Brother   . Prostate cancer Unknown        maternal and paternal  grandparents  . Heart attack Maternal Aunt        deceased age 63  . Heart disease Maternal Aunt     Diet: joined Weight Watchers and has significantly improved diet, eating more baked foods Exercise: getting more exercise, has lost weight recently,   The following portions of the patient's history were reviewed and updated as appropriate: allergies, current medications, past family history, past medical history, past social history, past surgical history and problem list.  Review of Systems Pertinent items are noted in  HPI.   Objective:  BP 123/82   Pulse 81   Ht 5\' 3"  (1.6 m)   Wt 186 lb (84.4 kg)   BMI 32.95 kg/m  CONSTITUTIONAL: Well-developed, well-nourished female in no acute distress.  HENT:  Normocephalic, atraumatic, External right and left ear normal. Oropharynx is clear and moist EYES: Conjunctivae and EOM are normal. Pupils are equal, round, and reactive to light. No scleral icterus.  NECK: Normal range of motion, supple, no masses.  Normal thyroid.  SKIN: Skin is warm and dry. No rash noted. Not diaphoretic. No erythema. No pallor. NEUROLOGIC: Alert and oriented to person, place, and time. Normal reflexes, muscle tone coordination. No cranial nerve deficit noted. PSYCHIATRIC: Normal mood and affect. Normal behavior. Normal judgment and thought content. CARDIOVASCULAR: Normal heart rate noted, regular rhythm RESPIRATORY: Clear to auscultation bilaterally. Effort and breath sounds normal, no problems with respiration noted. BREASTS: Symmetric in size. No masses, skin changes, nipple drainage, or lymphadenopathy. ABDOMEN: Soft, normal bowel sounds, no distention noted.  No tenderness, rebound or guarding.  PELVIC: Normal appearing external genitalia; normal appearing vaginal mucosa.  No abnormal discharge noted. Cervix surgically absent. Uterus surgically absent, no other palpable masses, no adnexal tenderness. MUSCULOSKELETAL: Normal range of motion. No tenderness.  No  cyanosis, clubbing, or edema.  2+ distal pulses.   Assessment and Plan:   1. Well woman exam Benign exam - MM 3D SCREEN BREAST BILATERAL; Future  2. Hot flushes, perimenopausal Patient currently with mild symptoms, reviewed that if her symptoms become bothersome, they can be treated  3. Vaginal dryness Encouraged use of lube prn  To return if dryness becomes bothersome Briefly reviewed using topical estrogen for symptom relief if she desires  Declines STI screen. Encouraged ongoing improvement in diet and exercise.  Mammogram scheduled  Routine preventative health maintenance measures emphasized. Please refer to After Visit Summary for other counseling recommendations.     Feliz Beam, M.D. Attending [place], [facility] for [employer], [place]

## 2018-01-12 MED FILL — VIT D2 1.25 MG (50,000 UNIT: 1.25 MG | 28 days supply | Qty: 4 | Fill #1

## 2018-01-12 MED FILL — SYNTHROID 175 MCG TABLET: 175 | 30 days supply | Qty: 30 | Fill #3

## 2018-01-13 ENCOUNTER — Telehealth: Payer: No Typology Code available for payment source | Admitting: Nurse Practitioner

## 2018-01-13 DIAGNOSIS — B373 Candidiasis of vulva and vagina: Secondary | ICD-10-CM

## 2018-01-13 DIAGNOSIS — B3731 Acute candidiasis of vulva and vagina: Secondary | ICD-10-CM

## 2018-01-13 MED ORDER — FLUCONAZOLE 150 MG PO TABS: 150.0000 mg | ORAL_TABLET | Freq: Once | ORAL | 0 refills | Status: AC

## 2018-01-13 MED FILL — FLUCONAZOLE 150 MG TABS: 150 | 1 days supply | Qty: 1 | Fill #0

## 2018-01-13 NOTE — Progress Notes (Signed)

## 2018-02-13 MED FILL — SYNTHROID 175 MCG TABLET: 175 | 30 days supply | Qty: 30 | Fill #4

## 2018-02-24 ENCOUNTER — Other Ambulatory Visit (INDEPENDENT_AMBULATORY_CARE_PROVIDER_SITE_OTHER): Payer: No Typology Code available for payment source

## 2018-02-24 DIAGNOSIS — R300 Dysuria: Secondary | ICD-10-CM

## 2018-02-24 DIAGNOSIS — R350 Frequency of micturition: Secondary | ICD-10-CM | POA: Diagnosis not present

## 2018-02-24 LAB — POC URINALSYSI DIPSTICK (AUTOMATED)
Bilirubin, UA: NEGATIVE
GLUCOSE UA: NEGATIVE
KETONES UA: NEGATIVE
Leukocytes, UA: NEGATIVE
Nitrite, UA: POSITIVE
Protein, UA: NEGATIVE
Urobilinogen, UA: 0.2 E.U./dL
pH, UA: 5.5 (ref 5.0–8.0)

## 2018-02-24 NOTE — Addendum Note (Signed)
Addended by: Magdalene Molly A on: 02/24/2018 04:28 PM   Modules accepted: Orders

## 2018-02-26 ENCOUNTER — Other Ambulatory Visit: Payer: Self-pay | Admitting: Family Medicine

## 2018-02-26 LAB — URINE CULTURE
MICRO NUMBER: 90945767
SPECIMEN QUALITY: ADEQUATE

## 2018-02-26 MED ORDER — CIPROFLOXACIN HCL 500 MG PO TABS: 500.0000 mg | ORAL_TABLET | Freq: Two times a day (BID) | ORAL | 0 refills | Status: DC

## 2018-02-27 MED FILL — VIT D2 1.25 MG (50,000 UNIT: 1.25 MG | 28 days supply | Qty: 4 | Fill #2

## 2018-02-27 MED FILL — CIPROFLOXACIN HCL 500 MG TA: 500 | 5 days supply | Qty: 10 | Fill #0

## 2018-03-27 MED FILL — SYNTHROID 175 MCG TABLET: 175 | 30 days supply | Qty: 30 | Fill #5

## 2018-03-27 MED FILL — VIT D2 1.25 MG (50,000 UNIT: 1.25 MG | 28 days supply | Qty: 4 | Fill #3

## 2018-03-31 ENCOUNTER — Encounter: Payer: Self-pay | Admitting: Family Medicine

## 2018-03-31 ENCOUNTER — Ambulatory Visit (INDEPENDENT_AMBULATORY_CARE_PROVIDER_SITE_OTHER): Payer: No Typology Code available for payment source | Admitting: Family Medicine

## 2018-03-31 VITALS — BP 134/85 | HR 80 | Temp 98.7°F | Resp 16 | Ht 63.0 in | Wt 184.0 lb

## 2018-03-31 DIAGNOSIS — R3989 Other symptoms and signs involving the genitourinary system: Secondary | ICD-10-CM

## 2018-03-31 DIAGNOSIS — R319 Hematuria, unspecified: Secondary | ICD-10-CM

## 2018-03-31 DIAGNOSIS — N390 Urinary tract infection, site not specified: Secondary | ICD-10-CM | POA: Diagnosis not present

## 2018-03-31 LAB — POC URINALSYSI DIPSTICK (AUTOMATED)
BILIRUBIN UA: NEGATIVE
Glucose, UA: NEGATIVE
KETONES UA: NEGATIVE
Leukocytes, UA: NEGATIVE
Nitrite, UA: POSITIVE
PH UA: 5.5 (ref 5.0–8.0)
PROTEIN UA: NEGATIVE
Urobilinogen, UA: 0.2 E.U./dL

## 2018-03-31 MED ORDER — CIPROFLOXACIN HCL 250 MG PO TABS: 250.0000 mg | ORAL_TABLET | Freq: Two times a day (BID) | ORAL | 0 refills | Status: DC

## 2018-03-31 MED ORDER — FLUCONAZOLE 150 MG PO TABS: ORAL_TABLET | ORAL | 0 refills | Status: DC

## 2018-03-31 MED ORDER — CEFTRIAXONE SODIUM 1 G IJ SOLR
1.0000 g | Freq: Once | INTRAMUSCULAR | Status: AC
  Administered 2018-03-31: 1 g via INTRAMUSCULAR

## 2018-03-31 MED FILL — CIPROFLOXACIN HCL 250 MG TA: 250 | 5 days supply | Qty: 10 | Fill #0

## 2018-03-31 MED FILL — FLUCONAZOLE 150 MG TABS: 150 | 2 days supply | Qty: 2 | Fill #0

## 2018-03-31 NOTE — Patient Instructions (Signed)

## 2018-03-31 NOTE — Progress Notes (Signed)
r 

## 2018-04-01 NOTE — Progress Notes (Signed)
Patient ID: Katie Henry, female    DOB: 02/03/1971  Age: 47 y.o. MRN: [id number]    Subjective:  Subjective  HPI Katie Henry presents for dysuria and low back pain x few days.  No fever.    Review of Systems  Constitutional: Negative for fever.  HENT: Negative for congestion.   Respiratory: Negative for shortness of breath.   Cardiovascular: Negative for chest pain, palpitations and leg swelling.  Gastrointestinal: Negative for abdominal pain, blood in stool and nausea.  Genitourinary: Positive for dysuria and flank pain. Negative for frequency.  Skin: Negative for rash.  Allergic/Immunologic: Negative for environmental allergies.  Neurological: Negative for dizziness and headaches.  Psychiatric/Behavioral: The patient is not nervous/anxious.     History Past Medical History:  Diagnosis Date  . Dental infection 05/28/2011  . Endometriosis   . Fainting episodes    Dr Katie Henry related  . Graves Henry   . Grief reaction 11/15/2016  . History of kidney stones 2003   onset with pregnacy   . History of UTI    last 4-5-weeks ago  . Hyperglycemia 11/15/2016   cbg 100  . Hyperthyroidism   . 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 IBS (irritable bowel syndrome)   . Migraine   . Syncope   . Vitamin D deficiency     She has a past surgical history that includes Appendectomy (1991); Endometrial ablation (01/2007); Vaginal hysterectomy (2010); Extracorporeal shock wave lithotripsy (Left, 07/21/2017);  treatment (1998); and Cystoscopy/ureteroscopy/holmium laser/stent placement (Left, 08/09/2017).   Her family history includes Dementia in her father; Diabetes in her maternal grandmother; Katie Henry in her sister; Heart attack in her maternal aunt; Heart Henry in her maternal aunt and mother; Heart Henry (age of onset: 65) in her maternal grandfather; Hyperlipidemia in her maternal grandfather, maternal grandmother, and mother; Hypertension in her father and  paternal grandfather; Kidney Henry in her maternal grandmother; Obesity in her brother; Prostate cancer in her maternal grandfather and unknown relative; Seizures in her father.She reports that she has quit smoking. Her smoking use included cigarettes. She has a 1.50 pack-year smoking history. She has never used smokeless tobacco. She reports that she does not drink alcohol or use drugs.  Current Outpatient Medications on File Prior to Visit  Medication Sig Dispense Refill  . acetaminophen (TYLENOL) 500 MG tablet Take 1,000 mg by mouth every 6 (six) hours as needed for mild pain or moderate pain.    . cetirizine (ZYRTEC) 10 MG tablet Take 1 tablet (10 mg total) by mouth daily. (Patient taking differently: Take 10 mg by mouth daily as needed for allergies. ) 30 tablet 0  . ciprofloxacin (CIPRO) 500 MG tablet Take 1 tablet (500 mg total) by mouth 2 (two) times daily. 10 tablet 0  . fluticasone (FLONASE) 50 MCG/ACT nasal spray Place 2 sprays into both nostrils daily. 16 g 6  . SYNTHROID 175 MCG tablet TAKE 1 TABLET BY MOUTH DAILY BEFORE BREAKFAST (Patient taking differently: TAKE 175 MCG BY MOUTH DAILY BEFORE BREAKFAST) 90 tablet PRN  . Vitamin D, Ergocalciferol, (DRISDOL) 50000 units CAPS capsule Take 1 capsule (50,000 Units total) by mouth every 7 (seven) days. 4 capsule 4   No current facility-administered medications on file prior to visit.      Objective:  Objective  Physical Exam  Constitutional: She is oriented to person, place, and time. She appears well-developed and well-nourished.  HENT:  Head: Normocephalic and atraumatic.  Eyes: Conjunctivae and EOM are normal.  Neck: Normal range of motion. Neck supple. No JVD present. Carotid bruit is not present. No thyromegaly present.  Cardiovascular: Normal rate, regular rhythm and normal heart sounds.  No murmur heard. Pulmonary/Chest: Effort normal and breath sounds normal. No respiratory distress. She has no wheezes. She has no rales. She  exhibits no tenderness.  Abdominal: She exhibits no distension. There is no tenderness. There is no guarding.  Musculoskeletal: She exhibits no edema.  Neurological: She is alert and oriented to person, place, and time.  Psychiatric: She has a normal mood and affect.  Nursing note and vitals reviewed.  BP 134/85 (BP Location: Left Arm, Patient Position: Sitting, Cuff Size: Small)   Pulse 80   Temp 98.7 F (37.1 C) (Oral)   Resp 16   Ht 5\' 3"  (1.6 m)   Wt 184 lb (83.5 kg)   SpO2 100%   BMI 32.59 kg/m  Wt Readings from Last 3 Encounters:  03/31/18 184 lb (83.5 kg)  01/04/18 186 lb (84.4 kg)  12/13/17 180 lb (81.6 kg)     Lab Results  Component Value Date   WBC 10.0 12/13/2017   HGB 14.0 12/13/2017   HCT 43.9 12/13/2017   PLT 437 12/13/2017   GLUCOSE 81 12/13/2017   CHOL 197 12/13/2017   TRIG 151 (H) 12/13/2017   HDL 63 12/13/2017   LDLCALC 104 (H) 12/13/2017   ALT 17 12/13/2017   AST 16 12/13/2017   NA 137 12/13/2017   K 4.4 12/13/2017   CL 99 12/13/2017   CREATININE 0.62 12/13/2017   BUN 9 12/13/2017   CO2 22 12/13/2017   TSH 0.750 12/13/2017   HGBA1C 5.1 12/13/2017    US Renal  Result Date: 08/14/2017 CLINICAL DATA:  LEFT flank pain, history of kidney stones, UTI, smoking EXAM: RENAL / URINARY TRACT ULTRASOUND COMPLETE COMPARISON:  08/03/2017 FINDINGS: Right Kidney: Length: 12.6 cm. Normal cortical thickness and echogenicity. Small cyst at upper pole 13 x 16 x 15 mm. No additional mass, hydronephrosis or shadowing calcification. Left Kidney: Length: 14.3 cm. Normal cortical thickness. Question minimally increased cortical echogenicity. Mild hydronephrosis. Shadowing 10 mm calculus at inferior pole. No definite LEFT renal mass. Bladder: Appears normal for degree of bladder distention. IMPRESSION: Mild LEFT hydronephrosis with a nonobstructing 10 mm calculus at inferior pole LEFT kidney. Degree of hydronephrosis appears similar to the previous exam. Small RIGHT renal  cyst. Electronically Signed   By: Katie Henry M.D.   On: 08/14/2017 16:49     Assessment & Plan:  Plan  I am having [name] start on ciprofloxacin and fluconazole. I am also having her maintain her fluticasone, cetirizine, SYNTHROID, acetaminophen, Vitamin D (Ergocalciferol), and ciprofloxacin. We administered cefTRIAXone.  Meds ordered this encounter  Medications  . ciprofloxacin (CIPRO) 250 MG tablet    Sig: Take 1 tablet (250 mg total) by mouth 2 (two) times daily.    Dispense:  10 tablet    Refill:  0  . fluconazole (DIFLUCAN) 150 MG tablet    Sig: 1 po x1, may repeat in 3 days prn    Dispense:  2 tablet    Refill:  0  . cefTRIAXone (ROCEPHIN) injection 1 g    Problem List Items Addressed This Visit    None    Visit Diagnoses    Urinary tract infection with hematuria, site unspecified    -  Primary   Relevant Medications   ciprofloxacin (CIPRO) 250 MG tablet   fluconazole (DIFLUCAN) 150 MG tablet  cefTRIAXone (ROCEPHIN) injection 1 g (Completed)   Hematuria, unspecified type       Relevant Orders   Urine Culture   Sensation of pressure in bladder area       Relevant Orders   POCT Urinalysis Dipstick (Automated) (Completed)      Follow-up: Return if symptoms worsen or fail to improve.  Ann Held, DO

## 2018-04-05 LAB — URINE CULTURE
MICRO NUMBER: 91107759
SPECIMEN QUALITY: ADEQUATE

## 2018-04-06 ENCOUNTER — Other Ambulatory Visit: Payer: Self-pay | Admitting: Family Medicine

## 2018-04-06 MED ORDER — NITROFURANTOIN MONOHYD MACRO 100 MG PO CAPS: 100.0000 mg | ORAL_CAPSULE | Freq: Two times a day (BID) | ORAL | 0 refills | Status: DC

## 2018-04-06 MED FILL — NITROFURANTOIN MONO-MCR 100: 100 | 7 days supply | Qty: 14 | Fill #0

## 2018-04-11 MED FILL — AMOXICILLIN 500 MG CAPSULE: 500 | 7 days supply | Qty: 29 | Fill #0

## 2018-04-12 ENCOUNTER — Other Ambulatory Visit: Payer: Self-pay | Admitting: Family Medicine

## 2018-04-12 DIAGNOSIS — Z1231 Encounter for screening mammogram for malignant neoplasm of breast: Secondary | ICD-10-CM

## 2018-04-26 ENCOUNTER — Ambulatory Visit (HOSPITAL_BASED_OUTPATIENT_CLINIC_OR_DEPARTMENT_OTHER)
Admission: RE | Admit: 2018-04-26 | Discharge: 2018-04-26 | Disposition: A | Discharge status: Home / Self Care | Payer: No Typology Code available for payment source | Source: Ambulatory Visit | Attending: Family Medicine | Admitting: Family Medicine

## 2018-04-26 ENCOUNTER — Encounter (HOSPITAL_BASED_OUTPATIENT_CLINIC_OR_DEPARTMENT_OTHER): Payer: Self-pay

## 2018-04-26 DIAGNOSIS — Z1231 Encounter for screening mammogram for malignant neoplasm of breast: Secondary | ICD-10-CM | POA: Diagnosis present

## 2018-05-03 MED FILL — SYNTHROID 175 MCG TABLET: 175 | 30 days supply | Qty: 30 | Fill #6

## 2018-05-08 ENCOUNTER — Ambulatory Visit (INDEPENDENT_AMBULATORY_CARE_PROVIDER_SITE_OTHER): Payer: No Typology Code available for payment source | Admitting: Gastroenterology

## 2018-05-08 ENCOUNTER — Encounter: Payer: Self-pay | Admitting: Gastroenterology

## 2018-05-08 ENCOUNTER — Other Ambulatory Visit (INDEPENDENT_AMBULATORY_CARE_PROVIDER_SITE_OTHER): Payer: No Typology Code available for payment source

## 2018-05-08 ENCOUNTER — Encounter: Payer: Self-pay | Admitting: Family Medicine

## 2018-05-08 VITALS — BP 134/90 | HR 93 | Ht 63.0 in | Wt 190.1 lb

## 2018-05-08 DIAGNOSIS — K648 Other hemorrhoids: Secondary | ICD-10-CM

## 2018-05-08 DIAGNOSIS — K625 Hemorrhage of anus and rectum: Secondary | ICD-10-CM

## 2018-05-08 DIAGNOSIS — K921 Melena: Secondary | ICD-10-CM

## 2018-05-08 DIAGNOSIS — K649 Unspecified hemorrhoids: Secondary | ICD-10-CM

## 2018-05-08 LAB — CBC
HCT: 42.2 % (ref 36.0–46.0)
Hemoglobin: 14.5 g/dL (ref 12.0–15.0)
MCHC: 34.4 g/dL (ref 30.0–36.0)
MCV: 93 fl (ref 78.0–100.0)
PLATELETS: 438 10*3/uL — AB (ref 150.0–400.0)
RBC: 4.54 Mil/uL (ref 3.87–5.11)
RDW: 12.3 % (ref 11.5–15.5)
WBC: 8.7 10*3/uL (ref 4.0–10.5)

## 2018-05-08 NOTE — Progress Notes (Signed)
Chief Complaint: Rectal bleeding   Referring Provider:     Vivien Rossetti, MD    HPI:     Katie Henry is a 47 y.o. female referred to the [facility] for evaluation of hematochezia. She states she had BRBPR this AM on tissue paper and in toilet water. 1 month ago with scant BRB on tissue paper x1 episode, otherwise no prior similar sxs. Today, she had left sided abdominal cramping discomfort around the time of BM, which has decreased in intensity but still present now.   Hx of abdominal pain intemittently. Pressure pain in L>R lower abdomen. No radiation. Has been ongoing for years. Not associated with types of food. Not always a/w BM. No n/v/f/c. Occurs at random, but less often now than in the past- occurring 2-3 times/month. Lasts 2 mins or less. No med trials for this.   No previous EGD or colonoscopy.   No known family history of CRC, GI malignancy, liver disease, pancreatic disease, or IBD.  CBC collected earlier today with results still pending.  No recent abdominal imaging for review.  Past Medical History:  Diagnosis Date  . Dental infection 05/28/2011  . Endometriosis   . Fainting episodes    Dr Luther Parody related  . Graves disease   . Grief reaction 11/15/2016  . History of kidney stones 2003   onset with pregnacy   . History of UTI    last 4-5-weeks ago  . Hyperglycemia 11/15/2016   cbg 100  . Hyperthyroidism   . 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 IBS (irritable bowel syndrome)   . Migraine   . Syncope   . Vitamin D deficiency      Past Surgical History:  Procedure Laterality Date  .  treatment  1998  . APPENDECTOMY  1991  . CYSTOSCOPY/URETEROSCOPY/HOLMIUM LASER/STENT PLACEMENT Left 08/09/2017   Procedure: CYSTOSCOPY/RETROGRADE/URETEROSCOPY/HOLMIUM LASER/STENT PLACEMENT;  Surgeon: Festus Aloe, MD;  Location: [facility];  Service: Urology;  Laterality: Left;  ONLY NEEDS 60 MIN  . ENDOMETRIAL ABLATION  01/2007   . EXTRACORPOREAL SHOCK WAVE LITHOTRIPSY Left 07/21/2017   Procedure: LEFT EXTRACORPOREAL SHOCK WAVE LITHOTRIPSY (ESWL);  Surgeon: Bjorn Loser, MD;  Location: [facility];  Service: Urology;  Laterality: Left;  Marland Kitchen VAGINAL HYSTERECTOMY  2010   ovaries still in place   Family History  Problem Relation Age of Onset  . Hyperlipidemia Mother   . Heart disease Mother        bicuspid mitral valve  . Hyperlipidemia Maternal Grandmother   . Diabetes Maternal Grandmother   . Kidney disease Maternal Grandmother   . Hyperlipidemia Maternal Grandfather   . Heart disease Maternal Grandfather 61       MI  . Prostate cancer Maternal Grandfather   . Hypertension Father   . Dementia Father   . Seizures Father        s/p TBI  . Hypertension Paternal Grandfather   . Graves' disease Sister   . Obesity Brother   . Prostate cancer Unknown        maternal and paternal grandparents  . Heart attack Maternal Aunt        deceased age 79  . Heart disease Maternal Aunt   . Colon cancer Neg Hx   . Esophageal cancer Neg Hx    Social History   Tobacco Use  . Smoking status: Former Smoker    Packs/day: 0.25    Years:  6.00    Pack years: 1.50    Types: Cigarettes  . Smokeless tobacco: Never Used  . Tobacco comment: quit 2010  Substance Use Topics  . Alcohol use: Yes    Frequency: Never    Comment: Ocassionally  . Drug use: No   Current Outpatient Medications  Medication Sig Dispense Refill  . acetaminophen (TYLENOL) 500 MG tablet Take 1,000 mg by mouth every 6 (six) hours as needed for mild pain or moderate pain.    . cetirizine (ZYRTEC) 10 MG tablet Take 1 tablet (10 mg total) by mouth daily. (Patient taking differently: Take 10 mg by mouth daily as needed for allergies. ) 30 tablet 0  . SYNTHROID 175 MCG tablet TAKE 1 TABLET BY MOUTH DAILY BEFORE BREAKFAST (Patient taking differently: TAKE 175 MCG BY MOUTH DAILY BEFORE BREAKFAST) 90 tablet PRN  . Vitamin D, Ergocalciferol, (DRISDOL) 50000 units  CAPS capsule Take 1 capsule (50,000 Units total) by mouth every 7 (seven) days. 4 capsule 4  . fluticasone (FLONASE) 50 MCG/ACT nasal spray Place 2 sprays into both nostrils daily. 16 g 6   No current facility-administered medications for this visit.    Allergies  Allergen Reactions  . Zofran [Ondansetron Hcl] Other (See Comments)    Pt has hx of migraines; medication increases headaches and nausea   . Amoxicillin Nausea And Vomiting and Other (See Comments)    Has patient had a PCN reaction causing immediate rash, facial/tongue/throat swelling, SOB or lightheadedness with hypotension: Unknown Has patient had a PCN reaction causing severe rash involving mucus membranes or skin necrosis: No Has patient had a PCN reaction that required hospitalization: No Has patient had a PCN reaction occurring within the last 10 years: No If all of the above answers are "NO", then may proceed with Cephalosporin use.   . Codeine Nausea And Vomiting  . Imitrex [Sumatriptan] Nausea And Vomiting  . Ultram [Tramadol Hcl] Nausea And Vomiting     Review of Systems: All systems reviewed and negative except where noted in HPI.     Physical Exam:    Wt Readings from Last 3 Encounters:  05/08/18 190 lb 2 oz (86.2 kg)  03/31/18 184 lb (83.5 kg)  01/04/18 186 lb (84.4 kg)    BP 134/90   Pulse 93   Ht 5\' 3"  (1.6 m)   Wt 190 lb 2 oz (86.2 kg)   BMI 33.68 kg/m  Constitutional:  Pleasant, in no acute distress. Psychiatric: Normal mood and affect. Behavior is normal. EENT: Pupils normal.  Conjunctivae are normal. No scleral icterus. Neck supple. No cervical LAD. Cardiovascular: Normal rate, regular rhythm. No edema Pulmonary/chest: Effort normal and breath sounds normal. No wheezing, rales or rhonchi. Abdominal: Soft, nondistended. Mild TTP in b/l LQ without rebound or guarding. No peritoneal signs. Bowel sounds active throughout. There are no masses palpable. No hepatomegaly. Neurological: Alert and  oriented to person place and time. Skin: Skin is warm and dry. No rashes noted. Rectal exam: Sensation intact and preserved anal wink. No external anal fissures, external hemorrhoids or skin tags. Normal sphincter tone. No palpable mass. No blood on the exam glove. Anoscopy notable for small, non-bleeding internal hemorhroid in the RP and LL positions. (Chaperone: Herma Mering).    ASSESSMENT AND PLAN;   RENUKA FARFAN is a 47 y.o. female presenting with:  1) Internal hemorrhoids: - Small, non-bleeding internal hemorrhoids noted on anoscopy. Likely source of bleeding. -Resume current high fiber diet of 25 gm/day with goal for soft,  formed stools without straining to have a BM -Increase daily fluid intake with at least 8 glasses of 8 ounces of water -Avoid straining to have a BM -Avoid prolonged time on the toilet -Patient due for age appropriate CRC screening.  We will also evaluate at that time for presence and severity of hemorrhoids. -We briefly discussed further treatment with hemorrhoid band ligation at this appointment -Otherwise, no hemodynamic changes or anemia due to hematochezia to warrant hospital admission, blood products, urgent endoscopy, etc. -Return to clinic following colonoscopy, or sooner if change in sxs  2) CRC screening: Due for age appropriate CRC screening. She would like to defer and have done in Jan 2020 for scheduling purposes, which is reasonable.   The indications, risks, and benefits of colonoscopy were explained to the patient in detail. Risks include but are not limited to bleeding, perforation, adverse reaction to medications, and cardiopulmonary compromise. Sequelae include but are not limited to the possibility of surgery, hospitalization, and mortality. The patient verbalized understanding and wished to proceed. All questions answered, referred to the scheduler and bowel prep ordered. Further recommendations pending results of the exam.    Lavena Bullion, DO, FACG  05/08/2018, 3:46 PM   [name], MD

## 2018-05-08 NOTE — Patient Instructions (Signed)
If you are age 47 or older, your body mass index should be between 23-30. Your Body mass index is 33.68 kg/m. If this is out of the aforementioned range listed, please consider follow up with your Primary Care Provider.  If you are age 92 or younger, your body mass index should be between 19-25. Your Body mass index is 33.68 kg/m. If this is out of the aformentioned range listed, please consider follow up with your Primary Care Provider.   Call us back when you are ready to schedule your Colonoscopy with Dr. Bryan Lemma.  It was a pleasure to see you today!  Vito Cirigliano, D.O.

## 2018-05-29 MED FILL — VIT D2 1.25 MG (50,000 UNIT: 1.25 MG | 28 days supply | Qty: 4 | Fill #4

## 2018-05-31 ENCOUNTER — Telehealth: Payer: No Typology Code available for payment source | Admitting: Nurse Practitioner

## 2018-05-31 DIAGNOSIS — B001 Herpesviral vesicular dermatitis: Secondary | ICD-10-CM

## 2018-05-31 MED ORDER — VALACYCLOVIR HCL 1 G PO TABS: ORAL_TABLET | ORAL | 0 refills | Status: DC

## 2018-05-31 MED FILL — valACYclovir HCL 1 GM TABS: 1 | 2 days supply | Qty: 8 | Fill #0

## 2018-05-31 NOTE — Progress Notes (Signed)
We are sorry that you are not feeling well.  Here is how we plan to help!  Based on what you have shared with me it does look like you have a viral infection.    Most cold sores or fever blisters are small fluid filled blisters around the mouth caused by herpes simplex virus.  The most common strain of the virus causing cold sores is herpes simplex virus 1.  It can be spread by skin contact, sharing eating utensils, or even sharing towels.  Cold sores are contagious to other people until dry. (Approximately 5-7 days).  Wash your hands. You can spread the virus to your eyes through handling your contact lenses after touching the lesions.  Most people experience pain at the sight or tingling sensations in their lips that may begin before the ulcers erupt.  Herpes simplex is treatable but not curable.  It may lie dormant for a long time and then reappear due to stress or prolonged sun exposure.  Many patients have success in treating their cold sores with an over the counter topical called Abreva.  You may apply the cream up to 5 times daily (maximum 10 days) until healing occurs.  If you would like to use an oral antiviral medication to speed the healing of your cold sore, I have sent a prescription to your local pharmacy Valacyclovir 2 gm twice daily for 1 day    HOME CARE:   Wash your hands frequently.  Do not pick at or rub the sore.  Don't open the blisters.  Avoid kissing other people during this time.  Avoid sharing drinking glasses, eating utensils, or razors.  Do not handle contact lenses unless you have thoroughly washed your hands with soap and warm water!  Avoid oral sex during this time.  Herpes from sores on your mouth can spread to your partner's genital area.  Avoid contact with anyone who has eczema or a weakened immune system.  Cold sores are often triggered by exposure to intense sunlight, use a lip balm containing a sunscreen (SPF 30 or higher).  GET HELP RIGHT AWAY  IF:   Blisters look infected.  Blisters occur near or in the eye.  Symptoms last longer than 10 days.  Your symptoms become worse.  MAKE SURE YOU:   Understand these instructions.  Will watch your condition.  Will get help right away if you are not doing well or get worse.    Your e-visit answers were reviewed by a board certified advanced clinical practitioner to complete your personal care plan.  Depending upon the condition, your plan could have  Included both over the counter or prescription medications.    Please review your pharmacy choice.  Be sure that the pharmacy you have chosen is open so that you can pick up your prescription now.  If there is a problem you can message your provider in MyChart to have the prescription routed to another pharmacy.    Your safety is important to us.  If you have drug allergies check our prescription carefully.  For the next 24 hours you can use MyChart to ask questions about today's visit, request a non-urgent call back, or ask for a work or school excuse from your e-visit provider.  You will get an email in the next two days asking about your experience.  I hope that your e-visit has been valuable and will speed your recovery.  

## 2018-06-09 MED FILL — SYNTHROID 175 MCG TABLET: 175 | 30 days supply | Qty: 30 | Fill #7

## 2018-07-19 HISTORY — PX: COLONOSCOPY: SHX174

## 2018-07-20 ENCOUNTER — Other Ambulatory Visit: Payer: Self-pay | Admitting: Family Medicine

## 2018-07-20 ENCOUNTER — Encounter: Payer: Self-pay | Admitting: Family Medicine

## 2018-07-20 DIAGNOSIS — E039 Hypothyroidism, unspecified: Secondary | ICD-10-CM

## 2018-07-20 DIAGNOSIS — E559 Vitamin D deficiency, unspecified: Secondary | ICD-10-CM

## 2018-07-20 MED ORDER — SYNTHROID 175 MCG PO TABS: ORAL_TABLET | ORAL | 3 refills | Status: DC

## 2018-07-20 MED FILL — SYNTHROID 175 MCG TABLET: 175 | 30 days supply | Qty: 30 | Fill #0

## 2018-07-20 NOTE — Telephone Encounter (Signed)
Dr Charlett Blake -- last TSH and Vit D levels were drawn 12/13/17.  Please advise refill requests?

## 2018-07-20 NOTE — Telephone Encounter (Signed)
Notified pt and entered future lab orders. Pt will have labs drawn tomorrow.

## 2018-07-21 ENCOUNTER — Other Ambulatory Visit (INDEPENDENT_AMBULATORY_CARE_PROVIDER_SITE_OTHER): Payer: No Typology Code available for payment source

## 2018-07-21 DIAGNOSIS — E559 Vitamin D deficiency, unspecified: Secondary | ICD-10-CM

## 2018-07-21 DIAGNOSIS — E039 Hypothyroidism, unspecified: Secondary | ICD-10-CM

## 2018-07-21 LAB — TSH: TSH: 4.28 u[IU]/mL (ref 0.35–4.50)

## 2018-07-21 LAB — VITAMIN D 25 HYDROXY (VIT D DEFICIENCY, FRACTURES): VITD: 32.23 ng/mL (ref 30.00–100.00)

## 2018-07-21 NOTE — Telephone Encounter (Signed)
Pt is aware and labs were drawn today.

## 2018-08-04 IMAGING — CR DG ABDOMEN 1V
2 series · 2 of 2 positions shown · non-contrast
Comparison: [facility] July 06, 2017

CLINICAL DATA: Left-sided kidney stones, pre lithotripsy study.

EXAM:
ABDOMEN - 1 VIEW

[t abdomen supine (1 of 2)]
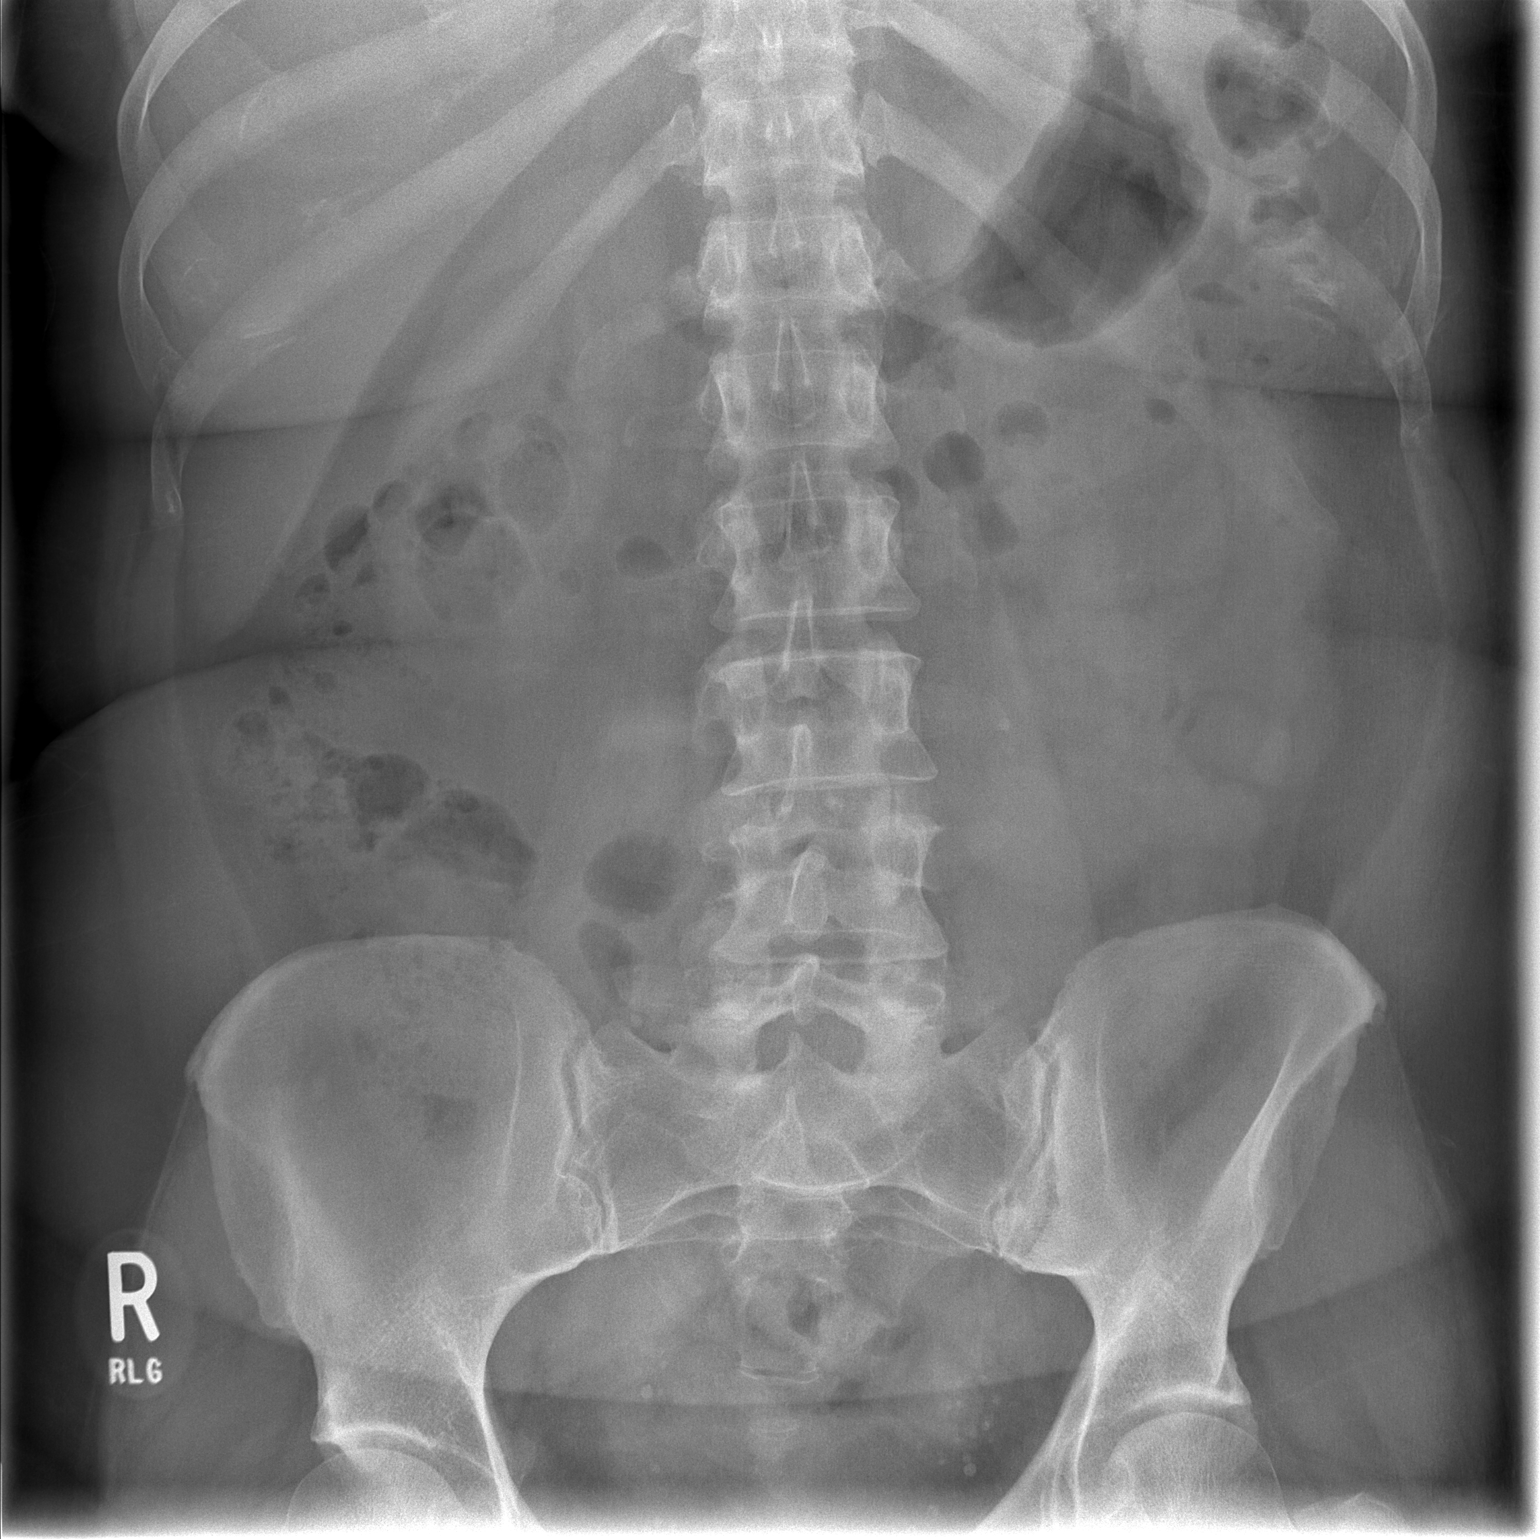

[t abdomen supine (2 of 2)]
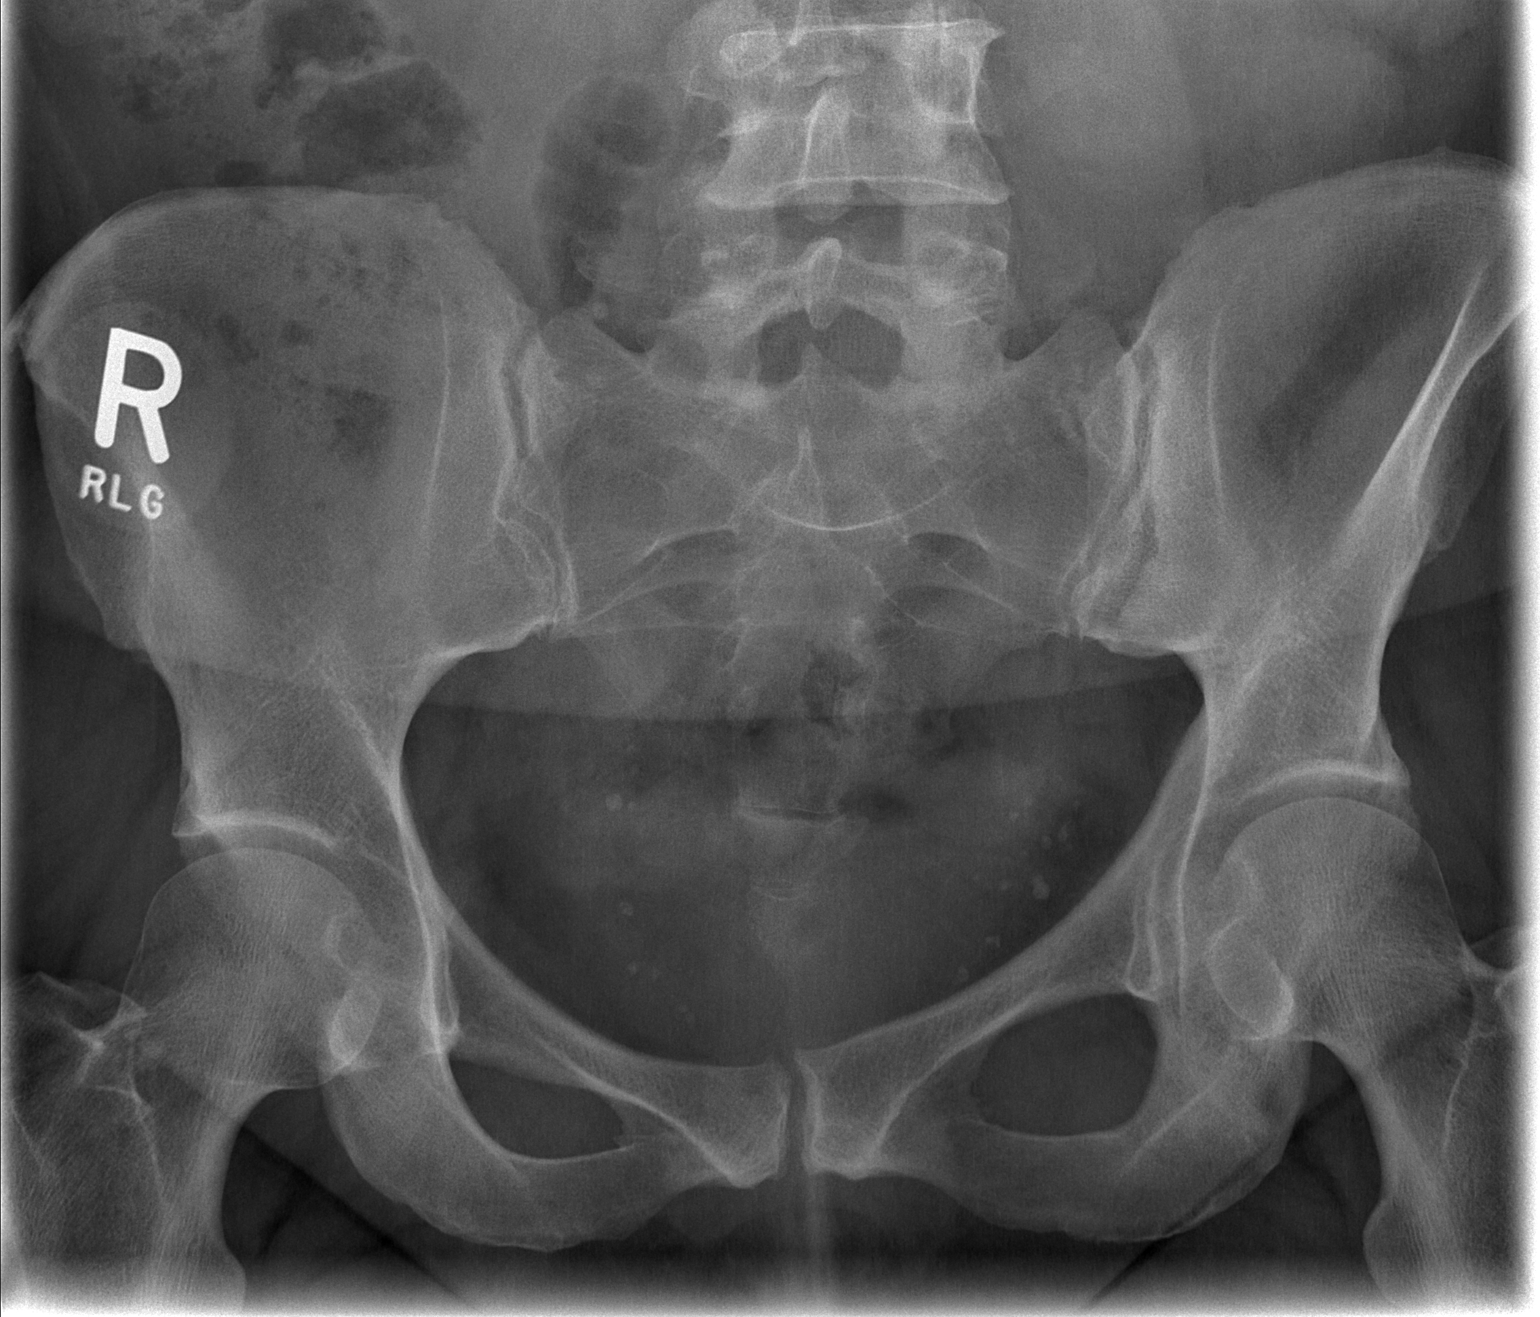

[2 of 2 positions shown; findings below may reference images not displayed]

FINDINGS: A faint calcification previously demonstrated just inferior to the
left L4 transverse process is not clearly evident today. A 2 mm
diameter calcification overlies the left L3 transverse process
today. In addition a faint stone overlies the lower pole of the left
kidney which also measures approximately 2 mm in diameter. On the
right no definite stones are observed. Within the pelvis there are
numerous phleboliths. The bowel gas pattern is normal.
IMPRESSION: The previously demonstrated coarse stone projecting inferior to the
left L4 transverse process is not definitely visualized. There is a
2 mm diameter stone projecting over the left L3 transverse process
on today's study.

## 2018-08-28 MED FILL — SYNTHROID 175 MCG TABLET: 175 | 30 days supply | Qty: 30 | Fill #1

## 2018-09-14 ENCOUNTER — Ambulatory Visit (INDEPENDENT_AMBULATORY_CARE_PROVIDER_SITE_OTHER): Payer: No Typology Code available for payment source | Admitting: Family Medicine

## 2018-09-14 ENCOUNTER — Encounter: Payer: Self-pay | Admitting: Family Medicine

## 2018-09-14 DIAGNOSIS — F32A Depression, unspecified: Secondary | ICD-10-CM

## 2018-09-14 DIAGNOSIS — F419 Anxiety disorder, unspecified: Secondary | ICD-10-CM | POA: Diagnosis not present

## 2018-09-14 DIAGNOSIS — F329 Major depressive disorder, single episode, unspecified: Secondary | ICD-10-CM | POA: Diagnosis not present

## 2018-09-14 MED ORDER — METOPROLOL SUCCINATE ER 25 MG PO TB24: 25.0000 mg | ORAL_TABLET | Freq: Every day | ORAL | 3 refills | Status: DC

## 2018-09-14 MED ORDER — ALPRAZOLAM 0.25 MG PO TABS: 0.2500 mg | ORAL_TABLET | Freq: Two times a day (BID) | ORAL | 1 refills | Status: DC | PRN

## 2018-09-14 MED FILL — ALPRAZolam 0.25 MG TABS: 0.25 | 15 days supply | Qty: 30 | Fill #0

## 2018-09-14 MED FILL — METOPROLOL SUCCINATE ER 25: 25 | 90 days supply | Qty: 90 | Fill #0

## 2018-09-14 NOTE — Patient Instructions (Addendum)
Generalized Anxiety Disorder, Adult Generalized anxiety disorder (GAD) is a mental health disorder. People with this condition constantly worry about everyday events. Unlike normal anxiety, worry related to GAD is not triggered by a specific event. These worries also do not fade or get better with time. GAD interferes with life functions, including relationships, work, and school. GAD can vary from mild to severe. People with severe GAD can have intense waves of anxiety with physical symptoms (panic attacks). What are the causes? The exact cause of GAD is not known. What increases the risk? This condition is more likely to develop in:  Women.  People who have a family history of anxiety disorders.  People who are very shy.  People who experience very stressful life events, such as the death of a loved one.  People who have a very stressful family environment. What are the signs or symptoms? People with GAD often worry excessively about many things in their lives, such as their health and family. They may also be overly concerned about:  Doing well at work.  Being on time.  Natural disasters.  Friendships. Physical symptoms of GAD include:  Fatigue.  Muscle tension or having muscle twitches.  Trembling or feeling shaky. Being easily startled. Hypertension Hypertension, commonly called high blood pressure, is when the force of blood pumping through the arteries is too strong. The arteries are the blood vessels that carry blood from the heart throughout the body. Hypertension forces the heart to work harder to pump blood and may cause arteries to become narrow or stiff. Having untreated or uncontrolled hypertension can cause heart attacks, strokes, kidney disease, and other problems. A blood pressure reading consists of a higher number over a lower number. Ideally, your blood pressure should be below 120/80. The first ("top") number is called the systolic pressure. It is a measure  of the pressure in your arteries as your heart beats. The second ("bottom") number is called the diastolic pressure. It is a measure of the pressure in your arteries as the heart relaxes. What are the causes? The cause of this condition is not known. What increases the risk? Some risk factors for high blood pressure are under your control. Others are not. Factors you can change  Smoking.  Having type 2 diabetes mellitus, high cholesterol, or both.  Not getting enough exercise or physical activity.  Being overweight.  Having too much fat, sugar, calories, or salt (sodium) in your diet.  Drinking too much alcohol. Factors that are difficult or impossible to change  Having chronic kidney disease.  Having a family history of high blood pressure.  Age. Risk increases with age.  Race. You may be at higher risk if you are African-American.  Gender. Men are at higher risk than women before age 74. After age 39, women are at higher risk than men.  Having obstructive sleep apnea.  Stress. What are the signs or symptoms? Extremely high blood pressure (hypertensive crisis) may cause:  Headache.  Anxiety.  Shortness of breath.  Nosebleed.  Nausea and vomiting.  Severe chest pain.  Jerky movements you cannot control (seizures). How is this diagnosed? This condition is diagnosed by measuring your blood pressure while you are seated, with your arm resting on a surface. The cuff of the blood pressure monitor will be placed directly against the skin of your upper arm at the level of your heart. It should be measured at least twice using the same arm. Certain conditions can cause a difference in blood  pressure between your right and left arms. Certain factors can cause blood pressure readings to be lower or higher than normal (elevated) for a short period of time:  When your blood pressure is higher when you are in a health care provider's office than when you are at home, this is  called white coat hypertension. Most people with this condition do not need medicines.  When your blood pressure is higher at home than when you are in a health care provider's office, this is called masked hypertension. Most people with this condition may need medicines to control blood pressure. If you have a high blood pressure reading during one visit or you have normal blood pressure with other risk factors:  You may be asked to return on a different day to have your blood pressure checked again.  You may be asked to monitor your blood pressure at home for 1 week or longer. If you are diagnosed with hypertension, you may have other blood or imaging tests to help your health care provider understand your overall risk for other conditions. How is this treated? This condition is treated by making healthy lifestyle changes, such as eating healthy foods, exercising more, and reducing your alcohol intake. Your health care provider may prescribe medicine if lifestyle changes are not enough to get your blood pressure under control, and if:  Your systolic blood pressure is above 130.  Your diastolic blood pressure is above 80. Your personal target blood pressure may vary depending on your medical conditions, your age, and other factors. Follow these instructions at home: Eating and drinking   Eat a diet that is high in fiber and potassium, and low in sodium, added sugar, and fat. An example eating plan is called the DASH (Dietary Approaches to Stop Hypertension) diet. To eat this way: ? Eat plenty of fresh fruits and vegetables. Try to fill half of your plate at each meal with fruits and vegetables. ? Eat whole grains, such as whole wheat pasta, brown rice, or whole grain bread. Fill about one quarter of your plate with whole grains. ? Eat or drink low-fat dairy products, such as skim milk or low-fat yogurt. ? Avoid fatty cuts of meat, processed or cured meats, and poultry with skin. Fill about  one quarter of your plate with lean proteins, such as fish, chicken without skin, beans, eggs, and tofu. ? Avoid premade and processed foods. These tend to be higher in sodium, added sugar, and fat.  Reduce your daily sodium intake. Most people with hypertension should eat less than 1,500 mg of sodium a day.  Limit alcohol intake to no more than 1 drink a day for nonpregnant women and 2 drinks a day for men. One drink equals 12 oz of beer, 5 oz of wine, or 1 oz of hard liquor. Lifestyle   Work with your health care provider to maintain a healthy body weight or to lose weight. Ask what an ideal weight is for you.  Get at least 30 minutes of exercise that causes your heart to beat faster (aerobic exercise) most days of the week. Activities may include walking, swimming, or biking.  Include exercise to strengthen your muscles (resistance exercise), such as pilates or lifting weights, as part of your weekly exercise routine. Try to do these types of exercises for 30 minutes at least 3 days a week.  Do not use any products that contain nicotine or tobacco, such as cigarettes and e-cigarettes. If you need help quitting, ask your  health care provider.  Monitor your blood pressure at home as told by your health care provider.  Keep all follow-up visits as told by your health care provider. This is important. Medicines  Take over-the-counter and prescription medicines only as told by your health care provider. Follow directions carefully. Blood pressure medicines must be taken as prescribed.  Do not skip doses of blood pressure medicine. Doing this puts you at risk for problems and can make the medicine less effective.  Ask your health care provider about side effects or reactions to medicines that you should watch for. Contact a health care provider if:  You think you are having a reaction to a medicine you are taking.  You have headaches that keep coming back (recurring).  You feel  dizzy.  You have swelling in your ankles.  You have trouble with your vision. Get help right away if:  You develop a severe headache or confusion.  You have unusual weakness or numbness.  You feel faint.  You have severe pain in your chest or abdomen.  You vomit repeatedly.  You have trouble breathing. Summary  Hypertension is when the force of blood pumping through your arteries is too strong. If this condition is not controlled, it may put you at risk for serious complications.  Your personal target blood pressure may vary depending on your medical conditions, your age, and other factors. For most people, a normal blood pressure is less than 120/80.  Hypertension is treated with lifestyle changes, medicines, or a combination of both. Lifestyle changes include weight loss, eating a healthy, low-sodium diet, exercising more, and limiting alcohol. This information is not intended to replace advice given to you by your health care provider. Make sure you discuss any questions you have with your health care provider. Document Released: 07/05/2005 Document Revised: 06/02/2016 Document Reviewed: 06/02/2016 Elsevier Interactive Patient Education  2019 [place] like your heart is pounding or racing.  Feeling out of breath or like you cannot take a deep breath.  Having trouble falling asleep or staying asleep.  Sweating.  Nausea, diarrhea, or irritable bowel syndrome (IBS).  Headaches.  Trouble concentrating or remembering facts.  Restlessness.  Irritability. How is this diagnosed? Your health care provider can diagnose GAD based on your symptoms and medical history. You will also have a physical exam. The health care provider will ask specific questions about your symptoms, including how severe they are, when they started, and if they come and go. Your health care provider may ask you about your use of alcohol or drugs, including prescription medicines. Your  health care provider may refer you to a mental health specialist for further evaluation. Your health care provider will do a thorough examination and may perform additional tests to rule out other possible causes of your symptoms. To be diagnosed with GAD, a person must have anxiety that:  Is out of his or her control.  Affects several different aspects of his or her life, such as work and relationships.  Causes distress that makes him or her unable to take part in normal activities.  Includes at least three physical symptoms of GAD, such as restlessness, fatigue, trouble concentrating, irritability, muscle tension, or sleep problems. Before your health care provider can confirm a diagnosis of GAD, these symptoms must be present more days than they are not, and they must last for six months or longer. How is this treated? The following therapies are usually used to treat GAD:  Medicine. Antidepressant medicine is usually prescribed for long-term daily control. Antianxiety medicines may be added in severe cases, especially when panic attacks occur.  Talk therapy (psychotherapy). Certain types of talk therapy can be helpful in treating GAD by providing support, education, and guidance. Options include: ? Cognitive behavioral therapy (CBT). People learn coping skills and techniques to ease their anxiety. They learn to identify unrealistic or negative thoughts and behaviors and to replace them with positive ones. ? Acceptance and commitment therapy (ACT). This treatment teaches people how to be mindful as a way to cope with unwanted thoughts and feelings. ? Biofeedback. This process trains you to manage your body's response (physiological response) through breathing techniques and relaxation methods. You will work with a therapist while machines are used to monitor your physical symptoms.  Stress management techniques. These include yoga, meditation, and exercise. A mental health specialist can  help determine which treatment is best for you. Some people see improvement with one type of therapy. However, other people require a combination of therapies. Follow these instructions at home:  Take over-the-counter and prescription medicines only as told by your health care provider.  Try to maintain a normal routine.  Try to anticipate stressful situations and allow extra time to manage them.  Practice any stress management or self-calming techniques as taught by your health care provider.  Do not punish yourself for setbacks or for not making progress.  Try to recognize your accomplishments, even if they are small.  Keep all follow-up visits as told by your health care provider. This is important. Contact a health care provider if:  Your symptoms do not get better.  Your symptoms get worse.  You have signs of depression, such as: ? A persistently sad, cranky, or irritable mood. ? Loss of enjoyment in activities that used to bring you joy. ? Change in weight or eating. ? Changes in sleeping habits. ? Avoiding friends or family members. ? Loss of energy for normal tasks. ? Feelings of guilt or worthlessness. Get help right away if:  You have serious thoughts about hurting yourself or others. If you ever feel like you may hurt yourself or others, or have thoughts about taking your own life, get help right away. You can go to your nearest emergency department or call:  Your local emergency services (911 in the [place]  A suicide crisis helpline, such as the [facility] at 4806389477. This is open 24 hours a day. Summary  Generalized anxiety disorder (GAD) is a mental health disorder that involves worry that is not triggered by a specific event.  People with GAD often worry excessively about many things in their lives, such as their health and family.  GAD may cause physical symptoms such as restlessness, trouble concentrating, sleep problems,  frequent sweating, nausea, diarrhea, headaches, and trembling or muscle twitching.  A mental health specialist can help determine which treatment is best for you. Some people see improvement with one type of therapy. However, other people require a combination of therapies. This information is not intended to replace advice given to you by your health care provider. Make sure you discuss any questions you have with your health care provider. Document Released: 10/30/2012 Document Revised: 05/25/2016 Document Reviewed: 05/25/2016 Elsevier Interactive Patient Education  2019 [employer].

## 2018-09-17 NOTE — Assessment & Plan Note (Signed)
She is struggling with a great deal of stress with a great many people around her very ill including her step daughter, her own mother and her husband's best friend. She is very sad and having anxiety attacks at times. She is started back on Alprazolam and will consider an SSRI if this situation continues for a period of time. Spent 20 minutes discussing plans of care.

## 2018-09-17 NOTE — Progress Notes (Signed)
Subjective:    Patient ID: Katie Henry, female    DOB: 06-17-1971, 48 y.o.   MRN: [id number]  No chief complaint on file.   HPI Patient is in today for evaluation of stress and anxiety. She is struggling with a great deal of stress with a great many people around her very ill including her step daughter, her own mother and her husband's best friend. She is very sad and having anxiety attacks at times. Denies CP/HA/congestion/fevers/GI or GU c/o. Taking meds as prescribed  Past Medical History:  Diagnosis Date  . Dental infection 05/28/2011  . Endometriosis   . Fainting episodes    Dr Luther Parody related  . Graves disease   . Grief reaction 11/15/2016  . History of kidney stones 2003   onset with pregnacy   . History of UTI    last 4-5-weeks ago  . Hyperglycemia 11/15/2016   cbg 100  . Hyperthyroidism   . 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 IBS (irritable bowel syndrome)   . Migraine   . Syncope   . Vitamin D deficiency     Past Surgical History:  Procedure Laterality Date  .  treatment  1998  . APPENDECTOMY  1991  . CYSTOSCOPY/URETEROSCOPY/HOLMIUM LASER/STENT PLACEMENT Left 08/09/2017   Procedure: CYSTOSCOPY/RETROGRADE/URETEROSCOPY/HOLMIUM LASER/STENT PLACEMENT;  Surgeon: Festus Aloe, MD;  Location: [facility];  Service: Urology;  Laterality: Left;  ONLY NEEDS 60 MIN  . ENDOMETRIAL ABLATION  01/2007  . EXTRACORPOREAL SHOCK WAVE LITHOTRIPSY Left 07/21/2017   Procedure: LEFT EXTRACORPOREAL SHOCK WAVE LITHOTRIPSY (ESWL);  Surgeon: Bjorn Loser, MD;  Location: [facility];  Service: Urology;  Laterality: Left;  Marland Kitchen VAGINAL HYSTERECTOMY  2010   ovaries still in place    Family History  Problem Relation Age of Onset  . Hyperlipidemia Mother   . Heart disease Mother        bicuspid mitral valve  . Hyperlipidemia Maternal Grandmother   . Diabetes Maternal Grandmother   . Kidney disease Maternal Grandmother   . Hyperlipidemia Maternal Grandfather   .  Heart disease Maternal Grandfather 46       MI  . Prostate cancer Maternal Grandfather   . Hypertension Father   . Dementia Father   . Seizures Father        s/p TBI  . Hypertension Paternal Grandfather   . Graves' disease Sister   . Obesity Brother   . Prostate cancer Unknown        maternal and paternal grandparents  . Heart attack Maternal Aunt        deceased age 61  . Heart disease Maternal Aunt   . Colon cancer Neg Hx   . Esophageal cancer Neg Hx     Social History   Socioeconomic History  . Marital status: Married    Spouse name: Not on file  . Number of children: 2  . Years of education: 11  . Highest education level: Not on file  Occupational History    Employer: [employer]  . Financial resource strain: Not on file  . Food insecurity:    Worry: Not on file    Inability: Not on file  . Transportation needs:    Medical: Not on file    Non-medical: Not on file  Tobacco Use  . Smoking status: Former Smoker    Packs/day: 0.25    Years: 6.00    Pack years: 1.50    Types: Cigarettes  . Smokeless tobacco:  Never Used  . Tobacco comment: quit 2010  Substance and Sexual Activity  . Alcohol use: Yes    Frequency: Never    Comment: Ocassionally  . Drug use: No  . Sexual activity: Yes    Partners: Male    Comment: work at [employer], lives with fiance and son  Lifestyle  . Physical activity:    Days per week: Not on file    Minutes per session: Not on file  . Stress: Not on file  Relationships  . Social connections:    Talks on phone: Not on file    Gets together: Not on file    Attends religious service: Not on file    Active member of club or organization: Not on file    Attends meetings of clubs or organizations: Not on file    Relationship status: Not on file  . Intimate partner violence:    Fear of current or ex partner: Not on file    Emotionally abused: Not on file    Physically abused: Not on file    Forced sexual activity: Not  on file  Other Topics Concern  . Not on file  Social History Narrative   Engaged, two sons.   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   No Tob, occ alcohol, no drugs.    Outpatient Medications Prior to Visit  Medication Sig Dispense Refill  . acetaminophen (TYLENOL) 500 MG tablet Take 1,000 mg by mouth every 6 (six) hours as needed for mild pain or moderate pain.    . cetirizine (ZYRTEC) 10 MG tablet Take 1 tablet (10 mg total) by mouth daily. (Patient taking differently: Take 10 mg by mouth daily as needed for allergies. ) 30 tablet 0  . fluticasone (FLONASE) 50 MCG/ACT nasal spray Place 2 sprays into both nostrils daily. 16 g 6  . SYNTHROID 175 MCG tablet TAKE 1 TABLET BY MOUTH DAILY BEFORE BREAKFAST 90 tablet 3  . valACYclovir (VALTREX) 1000 MG tablet 2 tablet BID for 1 day 8 tablet 0   No facility-administered medications prior to visit.     Allergies  Allergen Reactions  . Zofran [Ondansetron Hcl] Other (See Comments)    Pt has hx of migraines; medication increases headaches and nausea   . Amoxicillin Nausea And Vomiting and Other (See Comments)    Has patient had a PCN reaction causing immediate rash, facial/tongue/throat swelling, SOB or lightheadedness with hypotension: Unknown Has patient had a PCN reaction causing severe rash involving mucus membranes or skin necrosis: No Has patient had a PCN reaction that required hospitalization: No Has patient had a PCN reaction occurring within the last 10 years: No If all of the above answers are "NO", then may proceed with Cephalosporin use.   . Codeine Nausea And Vomiting  . Imitrex [Sumatriptan] Nausea And Vomiting  . Ultram [Tramadol Hcl] Nausea And Vomiting    Review of Systems  Constitutional: Negative for fever and malaise/fatigue.  HENT: Negative for congestion.   Eyes: Negative for blurred vision.  Respiratory: Negative for shortness of breath.   Cardiovascular: Negative for chest  pain, palpitations and leg swelling.  Gastrointestinal: Negative for abdominal pain, blood in stool and nausea.  Genitourinary: Negative for dysuria and frequency.  Musculoskeletal: Negative for falls.  Skin: Negative for rash.  Neurological: Negative for dizziness, loss of consciousness and headaches.  Endo/Heme/Allergies: Negative for environmental allergies.  Psychiatric/Behavioral: Positive for depression. The patient is nervous/anxious. The patient does not  have insomnia.        Objective:    Physical Exam Vitals signs and nursing note reviewed.  Constitutional:      General: She is not in acute distress.    Appearance: She is well-developed.  HENT:     Head: Normocephalic and atraumatic.     Nose: Nose normal.  Eyes:     General:        Right eye: No discharge.        Left eye: No discharge.  Neck:     Musculoskeletal: Normal range of motion and neck supple.  Cardiovascular:     Rate and Rhythm: Normal rate and regular rhythm.     Heart sounds: No murmur.  Pulmonary:     Effort: Pulmonary effort is normal.     Breath sounds: Normal breath sounds.  Abdominal:     General: Bowel sounds are normal.     Palpations: Abdomen is soft.     Tenderness: There is no abdominal tenderness.  Skin:    General: Skin is warm and dry.  Neurological:     Mental Status: She is alert and oriented to person, place, and time.     BP 128/88   Pulse 84   Temp 98.8 F (37.1 C) (Oral)   Resp 18   Wt 188 lb (85.3 kg)   SpO2 98%   BMI 33.30 kg/m  Wt Readings from Last 3 Encounters:  09/14/18 188 lb (85.3 kg)  05/08/18 190 lb 2 oz (86.2 kg)  03/31/18 184 lb (83.5 kg)     Lab Results  Component Value Date   WBC 8.7 05/08/2018   HGB 14.5 05/08/2018   HCT 42.2 05/08/2018   PLT 438.0 (H) 05/08/2018   GLUCOSE 81 12/13/2017   CHOL 197 12/13/2017   TRIG 151 (H) 12/13/2017   HDL 63 12/13/2017   LDLCALC 104 (H) 12/13/2017   ALT 17 12/13/2017   AST 16 12/13/2017   NA 137  12/13/2017   K 4.4 12/13/2017   CL 99 12/13/2017   CREATININE 0.62 12/13/2017   BUN 9 12/13/2017   CO2 22 12/13/2017   TSH 4.28 07/21/2018   HGBA1C 5.1 12/13/2017    Lab Results  Component Value Date   TSH 4.28 07/21/2018   Lab Results  Component Value Date   WBC 8.7 05/08/2018   HGB 14.5 05/08/2018   HCT 42.2 05/08/2018   MCV 93.0 05/08/2018   PLT 438.0 (H) 05/08/2018   Lab Results  Component Value Date   NA 137 12/13/2017   K 4.4 12/13/2017   CO2 22 12/13/2017   GLUCOSE 81 12/13/2017   BUN 9 12/13/2017   CREATININE 0.62 12/13/2017   BILITOT 0.5 12/13/2017   ALKPHOS 84 12/13/2017   AST 16 12/13/2017   ALT 17 12/13/2017   PROT 6.8 12/13/2017   ALBUMIN 4.2 12/13/2017   CALCIUM 9.6 12/13/2017   ANIONGAP 7 08/14/2017   GFR 119.54 06/29/2017   Lab Results  Component Value Date   CHOL 197 12/13/2017   Lab Results  Component Value Date   HDL 63 12/13/2017   Lab Results  Component Value Date   LDLCALC 104 (H) 12/13/2017   Lab Results  Component Value Date   TRIG 151 (H) 12/13/2017   Lab Results  Component Value Date   CHOLHDL 3.1 12/13/2017   Lab Results  Component Value Date   HGBA1C 5.1 12/13/2017       Assessment & Plan:   Problem List  Items Addressed This Visit    Anxiety and depression    She is struggling with a great deal of stress with a great many people around her very ill including her step daughter, her own mother and her husband's best friend. She is very sad and having anxiety attacks at times. She is started back on Alprazolam and will consider an SSRI if this situation continues for a period of time. Spent 20 minutes discussing plans of care.       Relevant Medications   ALPRAZolam (XANAX) 0.25 MG tablet      I am having Fia M. Tatar start on metoprolol succinate. I am also having her maintain her fluticasone, cetirizine, acetaminophen, valACYclovir, SYNTHROID, and ALPRAZolam.  Meds ordered this encounter  Medications  .  DISCONTD: ALPRAZolam (XANAX) 0.25 MG tablet    Sig: Take 1 tablet (0.25 mg total) by mouth 2 (two) times daily as needed for anxiety or sleep.    Dispense:  30 tablet    Refill:  1  . metoprolol succinate (TOPROL-XL) 25 MG 24 hr tablet    Sig: Take 1 tablet (25 mg total) by mouth daily.    Dispense:  90 tablet    Refill:  3  . ALPRAZolam (XANAX) 0.25 MG tablet    Sig: Take 1 tablet (0.25 mg total) by mouth 2 (two) times daily as needed for anxiety or sleep.    Dispense:  30 tablet    Refill:  1     Penni Homans, MD

## 2018-09-29 MED FILL — SYNTHROID 175 MCG TABLET: 175 | 30 days supply | Qty: 30 | Fill #2

## 2018-10-06 ENCOUNTER — Ambulatory Visit (INDEPENDENT_AMBULATORY_CARE_PROVIDER_SITE_OTHER): Payer: No Typology Code available for payment source | Admitting: Family Medicine

## 2018-10-06 ENCOUNTER — Other Ambulatory Visit: Payer: Self-pay

## 2018-10-06 VITALS — BP 132/87 | HR 86

## 2018-10-06 DIAGNOSIS — F419 Anxiety disorder, unspecified: Secondary | ICD-10-CM

## 2018-10-06 DIAGNOSIS — F329 Major depressive disorder, single episode, unspecified: Secondary | ICD-10-CM

## 2018-10-06 NOTE — Progress Notes (Signed)
Nursing blood pressure check note reviewed. Agree with documention and plan. 

## 2018-10-06 NOTE — Progress Notes (Signed)
Pre visit review using our clinic tool,if applicable. No additional management support is needed unless otherwise documented below in the visit note.   Pt here for Blood pressure check per Dr. Penni Homans.  Pt currently takes: Toprol 25 mg daily. Started 09/14/18   Pt reports compliance with medication.  BP today  =132/87 HR = 86  Pt advised per Dr. Charlett Blake no changes at this time.  Nursing blood pressure note reviewed and agree with documentation  [name] MD

## 2018-11-06 MED FILL — SYNTHROID 175 MCG TABLET: 175 | 30 days supply | Qty: 30 | Fill #3

## 2018-11-22 ENCOUNTER — Encounter: Payer: Self-pay | Admitting: Family Medicine

## 2018-11-22 DIAGNOSIS — K625 Hemorrhage of anus and rectum: Secondary | ICD-10-CM

## 2018-11-27 ENCOUNTER — Encounter: Payer: Self-pay | Admitting: Family Medicine

## 2018-11-27 ENCOUNTER — Other Ambulatory Visit: Payer: Self-pay

## 2018-11-27 ENCOUNTER — Ambulatory Visit (INDEPENDENT_AMBULATORY_CARE_PROVIDER_SITE_OTHER): Payer: No Typology Code available for payment source | Admitting: Family Medicine

## 2018-11-27 DIAGNOSIS — B354 Tinea corporis: Secondary | ICD-10-CM | POA: Insufficient documentation

## 2018-11-27 DIAGNOSIS — K625 Hemorrhage of anus and rectum: Secondary | ICD-10-CM

## 2018-11-27 LAB — CBC
HCT: 44.4 % (ref 36.0–46.0)
Hemoglobin: 15 g/dL (ref 12.0–15.0)
MCHC: 33.7 g/dL (ref 30.0–36.0)
MCV: 93.9 fl (ref 78.0–100.0)
Platelets: 411 10*3/uL — ABNORMAL HIGH (ref 150.0–400.0)
RBC: 4.73 Mil/uL (ref 3.87–5.11)
RDW: 12.6 % (ref 11.5–15.5)
WBC: 8.2 10*3/uL (ref 4.0–10.5)

## 2018-11-27 LAB — FERRITIN: Ferritin: 213.4 ng/mL (ref 10.0–291.0)

## 2018-11-27 NOTE — Telephone Encounter (Signed)
Orders placed.

## 2018-11-27 NOTE — Progress Notes (Signed)
Subjective:    Patient ID: Katie Henry, female    DOB: 04/07/1971, 48 y.o.   MRN: [id number]  Chief Complaint  Patient presents with  . Labs Only    HPI Patient is in today for evaluation of rashes and an episode of rectal bleeding over the weekend. It has not recurred but she has had this problem in the past. She has already been seen by gastroenterology but did not proceed with recommended colonoscopy but is willing to proceed now. Is also noting 2 rashes on on her left breast and the other on her right lateral hip. They are raised and itchy. Denies CP/palp/SOB/HA/congestion/fevers/GI or GU c/o. Taking meds as prescribed  Past Medical History:  Diagnosis Date  . Dental infection 05/28/2011  . Endometriosis   . Fainting episodes    Dr Luther Parody related  . Graves disease   . Grief reaction 11/15/2016  . History of kidney stones 2003   onset with pregnacy   . History of UTI    last 4-5-weeks ago  . Hyperglycemia 11/15/2016   cbg 100  . Hyperthyroidism   . 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 IBS (irritable bowel syndrome)   . Migraine   . Syncope   . Vitamin D deficiency     Past Surgical History:  Procedure Laterality Date  .  treatment  1998  . APPENDECTOMY  1991  . CYSTOSCOPY/URETEROSCOPY/HOLMIUM LASER/STENT PLACEMENT Left 08/09/2017   Procedure: CYSTOSCOPY/RETROGRADE/URETEROSCOPY/HOLMIUM LASER/STENT PLACEMENT;  Surgeon: Festus Aloe, MD;  Location: [facility];  Service: Urology;  Laterality: Left;  ONLY NEEDS 60 MIN  . ENDOMETRIAL ABLATION  01/2007  . EXTRACORPOREAL SHOCK WAVE LITHOTRIPSY Left 07/21/2017   Procedure: LEFT EXTRACORPOREAL SHOCK WAVE LITHOTRIPSY (ESWL);  Surgeon: Bjorn Loser, MD;  Location: [facility];  Service: Urology;  Laterality: Left;  Marland Kitchen VAGINAL HYSTERECTOMY  2010   ovaries still in place    Family History  Problem Relation Age of Onset  . Hyperlipidemia Mother   . Heart disease Mother        bicuspid mitral valve  .  Hyperlipidemia Maternal Grandmother   . Diabetes Maternal Grandmother   . Kidney disease Maternal Grandmother   . Hyperlipidemia Maternal Grandfather   . Heart disease Maternal Grandfather 61       MI  . Prostate cancer Maternal Grandfather   . Hypertension Father   . Dementia Father   . Seizures Father        s/p TBI  . Hypertension Paternal Grandfather   . Graves' disease Sister   . Obesity Brother   . Prostate cancer Unknown        maternal and paternal grandparents  . Heart attack Maternal Aunt        deceased age 55  . Heart disease Maternal Aunt   . Colon cancer Neg Hx   . Esophageal cancer Neg Hx     Social History   Socioeconomic History  . Marital status: Married    Spouse name: Not on file  . Number of children: 2  . Years of education: 32  . Highest education level: Not on file  Occupational History    Employer: [employer]  . Financial resource strain: Not on file  . Food insecurity:    Worry: Not on file    Inability: Not on file  . Transportation needs:    Medical: Not on file    Non-medical: Not on file  Tobacco Use  .  Smoking status: Former Smoker    Packs/day: 0.25    Years: 6.00    Pack years: 1.50    Types: Cigarettes  . Smokeless tobacco: Never Used  . Tobacco comment: quit 2010  Substance and Sexual Activity  . Alcohol use: Yes    Frequency: Never    Comment: Ocassionally  . Drug use: No  . Sexual activity: Yes    Partners: Male    Comment: work at [employer], lives with fiance and son  Lifestyle  . Physical activity:    Days per week: Not on file    Minutes per session: Not on file  . Stress: Not on file  Relationships  . Social connections:    Talks on phone: Not on file    Gets together: Not on file    Attends religious service: Not on file    Active member of club or organization: Not on file    Attends meetings of clubs or organizations: Not on file    Relationship status: Not on file  . Intimate partner  violence:    Fear of current or ex partner: Not on file    Emotionally abused: Not on file    Physically abused: Not on file    Forced sexual activity: Not on file  Other Topics Concern  . Not on file  Social History Narrative   Engaged, two sons.   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   No Tob, occ alcohol, no drugs.    Outpatient Medications Prior to Visit  Medication Sig Dispense Refill  . acetaminophen (TYLENOL) 500 MG tablet Take 1,000 mg by mouth every 6 (six) hours as needed for mild pain or moderate pain.    Marland Kitchen ALPRAZolam (XANAX) 0.25 MG tablet Take 1 tablet (0.25 mg total) by mouth 2 (two) times daily as needed for anxiety or sleep. 30 tablet 1  . cetirizine (ZYRTEC) 10 MG tablet Take 1 tablet (10 mg total) by mouth daily. (Patient taking differently: Take 10 mg by mouth daily as needed for allergies. ) 30 tablet 0  . fluticasone (FLONASE) 50 MCG/ACT nasal spray Place 2 sprays into both nostrils daily. 16 g 6  . metoprolol succinate (TOPROL-XL) 25 MG 24 hr tablet Take 1 tablet (25 mg total) by mouth daily. 90 tablet 3  . SYNTHROID 175 MCG tablet TAKE 1 TABLET BY MOUTH DAILY BEFORE BREAKFAST 90 tablet 3  . valACYclovir (VALTREX) 1000 MG tablet 2 tablet BID for 1 day 8 tablet 0   No facility-administered medications prior to visit.     Allergies  Allergen Reactions  . Zofran [Ondansetron Hcl] Other (See Comments)    Pt has hx of migraines; medication increases headaches and nausea   . Amoxicillin Nausea And Vomiting and Other (See Comments)    Has patient had a PCN reaction causing immediate rash, facial/tongue/throat swelling, SOB or lightheadedness with hypotension: Unknown Has patient had a PCN reaction causing severe rash involving mucus membranes or skin necrosis: No Has patient had a PCN reaction that required hospitalization: No Has patient had a PCN reaction occurring within the last 10 years: No If all of the above answers are  "NO", then may proceed with Cephalosporin use.   . Codeine Nausea And Vomiting  . Imitrex [Sumatriptan] Nausea And Vomiting  . Ultram [Tramadol Hcl] Nausea And Vomiting    Review of Systems  Constitutional: Negative for fever and malaise/fatigue.  HENT: Negative for congestion.   Eyes:  Negative for blurred vision.  Respiratory: Negative for shortness of breath.   Cardiovascular: Negative for chest pain, palpitations and leg swelling.  Gastrointestinal: Positive for blood in stool. Negative for abdominal pain and nausea.  Genitourinary: Negative for dysuria and frequency.  Musculoskeletal: Negative for falls.  Skin: Positive for itching and rash.  Neurological: Negative for dizziness, loss of consciousness and headaches.  Endo/Heme/Allergies: Negative for environmental allergies.  Psychiatric/Behavioral: Negative for depression. The patient is not nervous/anxious.        Objective:    Physical Exam Constitutional:      Appearance: Normal appearance. She is not ill-appearing or toxic-appearing.  HENT:     Head: Normocephalic and atraumatic.     Right Ear: External ear normal.     Left Ear: External ear normal.     Nose: Nose normal.     Mouth/Throat:     Mouth: Mucous membranes are moist.  Eyes:     General:        Right eye: No discharge.        Left eye: No discharge.  Pulmonary:     Effort: Pulmonary effort is normal.  Skin:    Findings: Rash present.     Comments: 1 cm raised oval scaly lesion left breast, 0.5 cm oval lesion lateral right hip, scaly, raised.lesion.  Neurological:     General: No focal deficit present.     Mental Status: She is alert and oriented to person, place, and time.  Psychiatric:        Mood and Affect: Mood normal.        Behavior: Behavior normal.     BP 138/87 (BP Location: Left Arm, Patient Position: Sitting, Cuff Size: Normal)   Pulse 97   Resp 18   Wt 193 lb (87.5 kg)   SpO2 98%   BMI 34.19 kg/m  Wt Readings from Last 3  Encounters:  11/27/18 193 lb (87.5 kg)  09/14/18 188 lb (85.3 kg)  05/08/18 190 lb 2 oz (86.2 kg)    Diabetic Foot Exam - Simple   No data filed     Lab Results  Component Value Date   WBC 8.2 11/27/2018   HGB 15.0 11/27/2018   HCT 44.4 11/27/2018   PLT 411.0 (H) 11/27/2018   GLUCOSE 81 12/13/2017   CHOL 197 12/13/2017   TRIG 151 (H) 12/13/2017   HDL 63 12/13/2017   LDLCALC 104 (H) 12/13/2017   ALT 17 12/13/2017   AST 16 12/13/2017   NA 137 12/13/2017   K 4.4 12/13/2017   CL 99 12/13/2017   CREATININE 0.62 12/13/2017   BUN 9 12/13/2017   CO2 22 12/13/2017   TSH 4.28 07/21/2018   HGBA1C 5.1 12/13/2017    Lab Results  Component Value Date   TSH 4.28 07/21/2018   Lab Results  Component Value Date   WBC 8.2 11/27/2018   HGB 15.0 11/27/2018   HCT 44.4 11/27/2018   MCV 93.9 11/27/2018   PLT 411.0 (H) 11/27/2018   Lab Results  Component Value Date   NA 137 12/13/2017   K 4.4 12/13/2017   CO2 22 12/13/2017   GLUCOSE 81 12/13/2017   BUN 9 12/13/2017   CREATININE 0.62 12/13/2017   BILITOT 0.5 12/13/2017   ALKPHOS 84 12/13/2017   AST 16 12/13/2017   ALT 17 12/13/2017   PROT 6.8 12/13/2017   ALBUMIN 4.2 12/13/2017   CALCIUM 9.6 12/13/2017   ANIONGAP 7 08/14/2017   GFR 119.54 06/29/2017  Lab Results  Component Value Date   CHOL 197 12/13/2017   Lab Results  Component Value Date   HDL 63 12/13/2017   Lab Results  Component Value Date   LDLCALC 104 (H) 12/13/2017   Lab Results  Component Value Date   TRIG 151 (H) 12/13/2017   Lab Results  Component Value Date   CHOLHDL 3.1 12/13/2017   Lab Results  Component Value Date   HGBA1C 5.1 12/13/2017       Assessment & Plan:   Problem List Items Addressed This Visit    Tinea corporis    Itchy lesion on left breast and another on right hip. Has been using otc hydrocortisone without much relief. Encouraged to try Clotrimazole or Terbenafine bid topically and let us know if does not resolve for  referral to dermatology      Rectal bleeding    1 episode over weekend has not recurred but agrees to call GI and schedule visit to get colonoscopy completed. Check labs and report further symptoms      Relevant Orders   Ambulatory referral to Gastroenterology      I am having Katie Henry maintain her fluticasone, cetirizine, acetaminophen, valACYclovir, Synthroid, metoprolol succinate, and ALPRAZolam.  No orders of the defined types were placed in this encounter.    Penni Homans, MD

## 2018-11-27 NOTE — Assessment & Plan Note (Signed)
1 episode over weekend has not recurred but agrees to call GI and schedule visit to get colonoscopy completed. Check labs and report further symptoms

## 2018-11-27 NOTE — Assessment & Plan Note (Signed)
Itchy lesion on left breast and another on right hip. Has been using otc hydrocortisone without much relief. Encouraged to try Clotrimazole or Terbenafine bid topically and let us know if does not resolve for referral to dermatology

## 2018-11-30 ENCOUNTER — Telehealth (INDEPENDENT_AMBULATORY_CARE_PROVIDER_SITE_OTHER): Payer: No Typology Code available for payment source | Admitting: Gastroenterology

## 2018-11-30 ENCOUNTER — Encounter: Payer: Self-pay | Admitting: Gastroenterology

## 2018-11-30 ENCOUNTER — Other Ambulatory Visit: Payer: Self-pay

## 2018-11-30 DIAGNOSIS — K921 Melena: Secondary | ICD-10-CM

## 2018-11-30 DIAGNOSIS — K640 First degree hemorrhoids: Secondary | ICD-10-CM

## 2018-11-30 DIAGNOSIS — R1030 Lower abdominal pain, unspecified: Secondary | ICD-10-CM

## 2018-11-30 DIAGNOSIS — Z1211 Encounter for screening for malignant neoplasm of colon: Secondary | ICD-10-CM | POA: Diagnosis not present

## 2018-11-30 MED ORDER — SOD PICOSULFATE-MAG OX-CIT ACD 10-3.5-12 MG-GM -GM/160ML PO SOLN: 1.0000 | Freq: Once | ORAL | 0 refills | Status: AC

## 2018-11-30 NOTE — Progress Notes (Signed)
Chief Complaint: Hematochezia  Referring Provider:     [name], MD  HPI:    Due to current restrictions/limitations of in-office visits due to the COVID-19 pandemic, this scheduled clinical appointment was converted to a telehealth virtual consultation using Dox[employer].  -Time of medical discussion: 15 minutes -The patient did consent to this virtual visit and is aware of possible charges through their insurance for this visit.  -Names of all parties present: Katie Henry (patient), Gerrit Heck, DO, [facility] (physician) -Patient location: Home -Physician location: Office  Interactive audio and video telecommunications were attempted between this provider and patient, however failed, due to patient having technical difficulties OR patient did not have access to video capability. We continued and completed visit with audio only.  Katie Henry is a 48 y.o. African-American female referred back to the [facility] for evaluation of hematochezia.  She was previously seen by me in 04/2018 for the same intermittent BRBPR.  Endorsed left-sided abdominal cramping at that time.  Rectal exam at that time with small, nonbleeding internal hemorrhoids noted on anoscopy.  Treated conservatively with high-fiber diet, increased fluids, appropriate toilet hygiene.  Additionally, recommended colonoscopy for age-appropriate CRC screening, which she elected to defer until 07/2018.  Today she states she had 4 episodes of bloody stools last week. Did have b/l lower abdominal pain with this episode, lasting 1-2 minutes, which has since resolved. No n/v.   Recent normal CBC, ferritin, TSH.  No previous EGD or colonoscopy.  No known family history of CRC, GI malignancy, liver disease, pancreatic disease, or IBD.  Past medical history, past surgical history, social history, family history, medications, and allergies reviewed in the chart and with patient.    Past Medical  History:  Diagnosis Date  . Dental infection 05/28/2011  . Endometriosis   . Fainting episodes    Dr Luther Parody related  . Graves disease   . Grief reaction 11/15/2016  . History of kidney stones 2003   onset with pregnacy   . History of UTI    last 4-5-weeks ago  . Hyperglycemia 11/15/2016   cbg 100  . Hyperthyroidism   . 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 IBS (irritable bowel syndrome)   . Migraine   . Syncope   . Vitamin D deficiency      Past Surgical History:  Procedure Laterality Date  .  treatment  1998  . APPENDECTOMY  1991  . CYSTOSCOPY/URETEROSCOPY/HOLMIUM LASER/STENT PLACEMENT Left 08/09/2017   Procedure: CYSTOSCOPY/RETROGRADE/URETEROSCOPY/HOLMIUM LASER/STENT PLACEMENT;  Surgeon: Festus Aloe, MD;  Location: [facility];  Service: Urology;  Laterality: Left;  ONLY NEEDS 60 MIN  . ENDOMETRIAL ABLATION  01/2007  . EXTRACORPOREAL SHOCK WAVE LITHOTRIPSY Left 07/21/2017   Procedure: LEFT EXTRACORPOREAL SHOCK WAVE LITHOTRIPSY (ESWL);  Surgeon: Bjorn Loser, MD;  Location: [facility];  Service: Urology;  Laterality: Left;  Marland Kitchen VAGINAL HYSTERECTOMY  2010   ovaries still in place   Family History  Problem Relation Age of Onset  . Hyperlipidemia Mother   . Heart disease Mother        bicuspid mitral valve  . Hyperlipidemia Maternal Grandmother   . Diabetes Maternal Grandmother   . Kidney disease Maternal Grandmother   . Hyperlipidemia Maternal Grandfather   . Heart disease Maternal Grandfather 36       MI  . Prostate cancer Maternal Grandfather   . Hypertension Father   . Dementia Father   .  Seizures Father        s/p TBI  . Hypertension Paternal Grandfather   . Graves' disease Sister   . Obesity Brother   . Prostate cancer Other        maternal and paternal grandparents  . Heart attack Maternal Aunt        deceased age 42  . Heart disease Maternal Aunt   . Colon cancer Neg Hx   . Esophageal cancer Neg Hx    Social History   Tobacco Use   . Smoking status: Former Smoker    Packs/day: 0.25    Years: 6.00    Pack years: 1.50    Types: Cigarettes  . Smokeless tobacco: Never Used  . Tobacco comment: quit 2010  Substance Use Topics  . Alcohol use: Yes    Frequency: Never    Comment: Ocassionally  . Drug use: No   Current Outpatient Medications  Medication Sig Dispense Refill  . acetaminophen (TYLENOL) 500 MG tablet Take 1,000 mg by mouth every 6 (six) hours as needed for mild pain or moderate pain.    Marland Kitchen ALPRAZolam (XANAX) 0.25 MG tablet Take 1 tablet (0.25 mg total) by mouth 2 (two) times daily as needed for anxiety or sleep. 30 tablet 1  . cetirizine (ZYRTEC) 10 MG tablet Take 1 tablet (10 mg total) by mouth daily. (Patient taking differently: Take 10 mg by mouth daily as needed for allergies. ) 30 tablet 0  . fluticasone (FLONASE) 50 MCG/ACT nasal spray Place 2 sprays into both nostrils daily. 16 g 6  . metoprolol succinate (TOPROL-XL) 25 MG 24 hr tablet Take 1 tablet (25 mg total) by mouth daily. 90 tablet 3  . SYNTHROID 175 MCG tablet TAKE 1 TABLET BY MOUTH DAILY BEFORE BREAKFAST 90 tablet 3  . valACYclovir (VALTREX) 1000 MG tablet 2 tablet BID for 1 day 8 tablet 0   No current facility-administered medications for this visit.    Allergies  Allergen Reactions  . Zofran [Ondansetron Hcl] Other (See Comments)    Pt has hx of migraines; medication increases headaches and nausea   . Amoxicillin Nausea And Vomiting and Other (See Comments)    Has patient had a PCN reaction causing immediate rash, facial/tongue/throat swelling, SOB or lightheadedness with hypotension: Unknown Has patient had a PCN reaction causing severe rash involving mucus membranes or skin necrosis: No Has patient had a PCN reaction that required hospitalization: No Has patient had a PCN reaction occurring within the last 10 years: No If all of the above answers are "NO", then may proceed with Cephalosporin use.   . Codeine Nausea And Vomiting  .  Imitrex [Sumatriptan] Nausea And Vomiting  . Ultram [Tramadol Hcl] Nausea And Vomiting     Review of Systems: All systems reviewed and negative except where noted in HPI.     Physical Exam:    Physical exam not completed due to the nature of this telehealth communication.  Patient was otherwise alert and oriented and well communicative.   ASSESSMENT AND PLAN;   1) Hematochezia: 2) History of Hemorrhoids: - Suspect BRBPR secondary to known hemorrhoids.  Did discuss alternate etiologies.  Previously treated conservatively.  However, given ongoing symptoms, age, no previous CRC screening, again strongly recommended colonoscopy additional diagnostic and potentially therapeutic options - Resume high-fiber diet -Resume increase fluid intake -No associated anemia - Can further discuss hemorrhoid band ligation pending colonoscopy findings   3) CRC screening: Due for age-appropriate CRC screening -Colonoscopy as above  4)  Lower abdominal pain: -Resolved -Suspect this could be related to some degree of constipation leading to flare of hemorrhoids, versus blood in the lumen that irritates.  Either way, pain is brief, lasting 1 to 2 minutes, and now resolved. -Can evaluate for luminal/mucosal etiology at time of colonoscopy as above  The indications, risks, and benefits of colonoscopy were explained to the patient in detail. Risks include but are not limited to bleeding, perforation, adverse reaction to medications, and cardiopulmonary compromise. Sequelae include but are not limited to the possibility of surgery, hospitalization, and mortality. The patient verbalized understanding and wished to proceed. All questions answered, referred to the scheduler and bowel prep ordered. Further recommendations pending results of the exam.    Lavena Bullion, DO, FACG  11/30/2018, 3:06 PM   [name], MD

## 2018-11-30 NOTE — Patient Instructions (Signed)
If you are age 48 or older, your body mass index should be between 23-30. Your There is no height or weight on file to calculate BMI. If this is out of the aforementioned range listed, please consider follow up with your Primary Care Provider.  If you are age 25 or younger, your body mass index should be between 19-25. Your There is no height or weight on file to calculate BMI. If this is out of the aformentioned range listed, please consider follow up with your Primary Care Provider.   You have been scheduled for a colonoscopy. Please follow written instructions given to you at your visit today.  Please pick up your prep supplies at the pharmacy within the next 1-3 days. If you use inhalers (even only as needed), please bring them with you on the day of your procedure. Your physician has requested that you go to www.startemmi.com and enter the access code given to you at your visit today. This web site gives a general overview about your procedure. However, you should still follow specific instructions given to you by our office regarding your preparation for the procedure.'  To help prevent the possible spread of infection to our patients, communities, and staff; we will be implementing the following measures:  As of now we are not allowing any visitors/family members to accompany you to any upcoming appointments with [facility]. If you have any concerns about this please contact our office to discuss prior to the appointment.   We have given you samples of the following medication to take: Clenpiq  It was a pleasure to see you today!  Vito Cirigliano, D.O.

## 2018-12-07 ENCOUNTER — Encounter: Payer: Self-pay | Admitting: Gastroenterology

## 2018-12-08 MED FILL — SYNTHROID 175 MCG TABLET: 175 | 30 days supply | Qty: 30 | Fill #4

## 2018-12-18 ENCOUNTER — Ambulatory Visit (INDEPENDENT_AMBULATORY_CARE_PROVIDER_SITE_OTHER): Payer: No Typology Code available for payment source | Admitting: Family Medicine

## 2018-12-18 ENCOUNTER — Encounter: Payer: Self-pay | Admitting: Family Medicine

## 2018-12-18 ENCOUNTER — Other Ambulatory Visit: Payer: Self-pay

## 2018-12-18 VITALS — BP 126/84 | HR 81 | Temp 98.2°F | Ht 63.0 in | Wt 193.0 lb

## 2018-12-18 DIAGNOSIS — R739 Hyperglycemia, unspecified: Secondary | ICD-10-CM | POA: Diagnosis not present

## 2018-12-18 DIAGNOSIS — E559 Vitamin D deficiency, unspecified: Secondary | ICD-10-CM | POA: Diagnosis not present

## 2018-12-18 DIAGNOSIS — E039 Hypothyroidism, unspecified: Secondary | ICD-10-CM | POA: Diagnosis not present

## 2018-12-18 DIAGNOSIS — E785 Hyperlipidemia, unspecified: Secondary | ICD-10-CM | POA: Diagnosis not present

## 2018-12-18 DIAGNOSIS — F4321 Adjustment disorder with depressed mood: Secondary | ICD-10-CM

## 2018-12-18 DIAGNOSIS — K625 Hemorrhage of anus and rectum: Secondary | ICD-10-CM

## 2018-12-18 DIAGNOSIS — Z0000 Encounter for general adult medical examination without abnormal findings: Secondary | ICD-10-CM

## 2018-12-18 DIAGNOSIS — N809 Endometriosis, unspecified: Secondary | ICD-10-CM

## 2018-12-18 DIAGNOSIS — K589 Irritable bowel syndrome without diarrhea: Secondary | ICD-10-CM

## 2018-12-18 MED ORDER — HYOSCYAMINE SULFATE 0.125 MG SL SUBL: 0.1250 mg | SUBLINGUAL_TABLET | SUBLINGUAL | 0 refills | Status: DC | PRN

## 2018-12-18 MED FILL — OSCIMIN SL 0.125 MG TABLET: 0.125 | 5 days supply | Qty: 30 | Fill #0

## 2018-12-18 NOTE — Assessment & Plan Note (Signed)
Supplement and monitoring

## 2018-12-18 NOTE — Patient Instructions (Signed)
Benefiber twice a day Preventive Care 40-64 Years, Female Preventive care refers to lifestyle choices and visits with your health care provider that can promote health and wellness. What does preventive care include?   A yearly physical exam. This is also called an annual well check.  Dental exams once or twice a year.  Routine eye exams. Ask your health care provider how often you should have your eyes checked.  Personal lifestyle choices, including: ? Daily care of your teeth and gums. ? Regular physical activity. ? Eating a healthy diet. ? Avoiding tobacco and drug use. ? Limiting alcohol use. ? Practicing safe sex. ? Taking low-dose aspirin daily starting at age 88. ? Taking vitamin and mineral supplements as recommended by your health care provider. What happens during an annual well check? The services and screenings done by your health care provider during your annual well check will depend on your age, overall health, lifestyle risk factors, and family history of disease. Counseling Your health care provider may ask you questions about your:  Alcohol use.  Tobacco use.  Drug use.  Emotional well-being.  Home and relationship well-being.  Sexual activity.  Eating habits.  Work and work Statistician.  Method of birth control.  Menstrual cycle.  Pregnancy history. Screening You may have the following tests or measurements:  Height, weight, and BMI.  Blood pressure.  Lipid and cholesterol levels. These may be checked every 5 years, or more frequently if you are over 22 years old.  Skin check.  Lung cancer screening. You may have this screening every year starting at age 88 if you have a 30-pack-year history of smoking and currently smoke or have quit within the past 15 years.  Colorectal cancer screening. All adults should have this screening starting at age 87 and continuing until age 31. Your health care provider may recommend screening at age 11. You  will have tests every 1-10 years, depending on your results and the type of screening test. People at increased risk should start screening at an earlier age. Screening tests may include: ? Guaiac-based fecal occult blood testing. ? Fecal immunochemical test (FIT). ? Stool DNA test. ? Virtual colonoscopy. ? Sigmoidoscopy. During this test, a flexible tube with a tiny camera (sigmoidoscope) is used to examine your rectum and lower colon. The sigmoidoscope is inserted through your anus into your rectum and lower colon. ? Colonoscopy. During this test, a long, thin, flexible tube with a tiny camera (colonoscope) is used to examine your entire colon and rectum.  Hepatitis C blood test.  Hepatitis B blood test.  Sexually transmitted disease (STD) testing.  Diabetes screening. This is done by checking your blood sugar (glucose) after you have not eaten for a while (fasting). You may have this done every 1-3 years.  Mammogram. This may be done every 1-2 years. Talk to your health care provider about when you should start having regular mammograms. This may depend on whether you have a family history of breast cancer.  BRCA-related cancer screening. This may be done if you have a family history of breast, ovarian, tubal, or peritoneal cancers.  Pelvic exam and Pap test. This may be done every 3 years starting at age 71. Starting at age 79, this may be done every 5 years if you have a Pap test in combination with an HPV test.  Bone density scan. This is done to screen for osteoporosis. You may have this scan if you are at high risk for osteoporosis. Discuss your  test results, treatment options, and if necessary, the need for more tests with your health care provider. Vaccines Your health care provider may recommend certain vaccines, such as:  Influenza vaccine. This is recommended every year.  Tetanus, diphtheria, and acellular pertussis (Tdap, Td) vaccine. You may need a Td booster every 10  years.  Varicella vaccine. You may need this if you have not been vaccinated.  Zoster vaccine. You may need this after age 29.  Measles, mumps, and rubella (MMR) vaccine. You may need at least one dose of MMR if you were born in 1957 or later. You may also need a second dose.  Pneumococcal 13-valent conjugate (PCV13) vaccine. You may need this if you have certain conditions and were not previously vaccinated.  Pneumococcal polysaccharide (PPSV23) vaccine. You may need one or two doses if you smoke cigarettes or if you have certain conditions.  Meningococcal vaccine. You may need this if you have certain conditions.  Hepatitis A vaccine. You may need this if you have certain conditions or if you travel or work in places where you may be exposed to hepatitis A.  Hepatitis B vaccine. You may need this if you have certain conditions or if you travel or work in places where you may be exposed to hepatitis B.  Haemophilus influenzae type b (Hib) vaccine. You may need this if you have certain conditions. Talk to your health care provider about which screenings and vaccines you need and how often you need them. This information is not intended to replace advice given to you by your health care provider. Make sure you discuss any questions you have with your health care provider. Document Released: 08/01/2015 Document Revised: 08/25/2017 Document Reviewed: 05/06/2015 Elsevier Interactive Patient Education  2019 [employer].

## 2018-12-18 NOTE — Assessment & Plan Note (Signed)
hgba1c acceptable, minimize simple carbs. Increase exercise as tolerated.  

## 2018-12-18 NOTE — Progress Notes (Signed)
Subjective:    Patient ID: Katie Henry, female    DOB: [birth year], 48 y.o.   MRN: [id number]  No chief complaint on file.   HPI Patient is in today for annual preventative exam. She is feeling better regarding stress despite the pandemic. Her family stressors have improved a good deal. No recent febrile illness or hospitalizations. Is eating better and less junk food. Is staying home during quarantine except when she comes to work. Denies CP/palp/SOB/HA/congestion/fevers/GI or GU c/o. Taking meds as prescribed  Past Medical History:  Diagnosis Date  . Dental infection 05/28/2011  . Endometriosis   . Fainting episodes    Dr Luther Parody related  . Graves disease   . Grief reaction 11/15/2016  . History of kidney stones 2003   onset with pregnacy   . History of UTI    last 4-5-weeks ago  . Hyperglycemia 11/15/2016   cbg 100  . Hyperthyroidism   . 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 IBS (irritable bowel syndrome)   . Migraine   . Syncope   . Vitamin D deficiency     Past Surgical History:  Procedure Laterality Date  .  treatment  1998  . APPENDECTOMY  1991  . CYSTOSCOPY/URETEROSCOPY/HOLMIUM LASER/STENT PLACEMENT Left 08/09/2017   Procedure: CYSTOSCOPY/RETROGRADE/URETEROSCOPY/HOLMIUM LASER/STENT PLACEMENT;  Surgeon: Festus Aloe, MD;  Location: [facility];  Service: Urology;  Laterality: Left;  ONLY NEEDS 60 MIN  . ENDOMETRIAL ABLATION  01/2007  . EXTRACORPOREAL SHOCK WAVE LITHOTRIPSY Left 07/21/2017   Procedure: LEFT EXTRACORPOREAL SHOCK WAVE LITHOTRIPSY (ESWL);  Surgeon: Bjorn Loser, MD;  Location: [facility];  Service: Urology;  Laterality: Left;  Marland Kitchen VAGINAL HYSTERECTOMY  2010   ovaries still in place    Family History  Problem Relation Age of Onset  . Hyperlipidemia Mother   . Heart disease Mother        bicuspid mitral valve  . Hyperlipidemia Maternal Grandmother   . Diabetes Maternal Grandmother   . Kidney disease Maternal Grandmother   .  Hyperlipidemia Maternal Grandfather   . Heart disease Maternal Grandfather 34       MI  . Prostate cancer Maternal Grandfather   . Hypertension Father   . Dementia Father   . Seizures Father        s/p TBI  . Hypertension Paternal Grandfather   . Graves' disease Sister   . Obesity Brother   . Prostate cancer Other        maternal and paternal grandparents  . Heart attack Maternal Aunt        deceased age 38  . Heart disease Maternal Aunt   . Colon cancer Neg Hx   . Esophageal cancer Neg Hx     Social History   Socioeconomic History  . Marital status: Married    Spouse name: Not on file  . Number of children: 2  . Years of education: 76  . Highest education level: Not on file  Occupational History    Employer: [employer]eds  . Financial resource strain: Not on file  . Food insecurity:    Worry: Not on file    Inability: Not on file  . Transportation needs:    Medical: Not on file    Non-medical: Not on file  Tobacco Use  . Smoking status: Former Smoker    Packs/day: 0.25    Years: 6.00    Pack years: 1.50    Types: Cigarettes  . Smokeless tobacco: Never  Used  . Tobacco comment: quit 2010  Substance and Sexual Activity  . Alcohol use: Yes    Frequency: Never    Comment: Ocassionally  . Drug use: No  . Sexual activity: Yes    Partners: Male    Comment: work at [employer], lives with fiance and son  Lifestyle  . Physical activity:    Days per week: Not on file    Minutes per session: Not on file  . Stress: Not on file  Relationships  . Social connections:    Talks on phone: Not on file    Gets together: Not on file    Attends religious service: Not on file    Active member of club or organization: Not on file    Attends meetings of clubs or organizations: Not on file    Relationship status: Not on file  . Intimate partner violence:    Fear of current or ex partner: Not on file    Emotionally abused: Not on file    Physically abused: Not on  file    Forced sexual activity: Not on file  Other Topics Concern  . Not on file  Social History Narrative   Engaged, two sons.   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   No Tob, occ alcohol, no drugs.    Outpatient Medications Prior to Visit  Medication Sig Dispense Refill  . acetaminophen (TYLENOL) 500 MG tablet Take 1,000 mg by mouth every 6 (six) hours as needed for mild pain or moderate pain.    Marland Kitchen ALPRAZolam (XANAX) 0.25 MG tablet Take 1 tablet (0.25 mg total) by mouth 2 (two) times daily as needed for anxiety or sleep. 30 tablet 1  . cetirizine (ZYRTEC) 10 MG tablet Take 1 tablet (10 mg total) by mouth daily. (Patient taking differently: Take 10 mg by mouth daily as needed for allergies. ) 30 tablet 0  . fluticasone (FLONASE) 50 MCG/ACT nasal spray Place 2 sprays into both nostrils daily. 16 g 6  . metoprolol succinate (TOPROL-XL) 25 MG 24 hr tablet Take 1 tablet (25 mg total) by mouth daily. 90 tablet 3  . SYNTHROID 175 MCG tablet TAKE 1 TABLET BY MOUTH DAILY BEFORE BREAKFAST 90 tablet 3  . valACYclovir (VALTREX) 1000 MG tablet 2 tablet BID for 1 day 8 tablet 0   No facility-administered medications prior to visit.     Allergies  Allergen Reactions  . Zofran [Ondansetron Hcl] Other (See Comments)    Pt has hx of migraines; medication increases headaches and nausea   . Amoxicillin Nausea And Vomiting and Other (See Comments)    Has patient had a PCN reaction causing immediate rash, facial/tongue/throat swelling, SOB or lightheadedness with hypotension: Unknown Has patient had a PCN reaction causing severe rash involving mucus membranes or skin necrosis: No Has patient had a PCN reaction that required hospitalization: No Has patient had a PCN reaction occurring within the last 10 years: No If all of the above answers are "NO", then may proceed with Cephalosporin use.   . Codeine Nausea And Vomiting  . Imitrex [Sumatriptan] Nausea And  Vomiting  . Ultram [Tramadol Hcl] Nausea And Vomiting    Review of Systems  Constitutional: Negative for chills, fever and malaise/fatigue.  HENT: Negative for congestion and hearing loss.   Eyes: Negative for discharge.  Respiratory: Negative for cough, sputum production and shortness of breath.   Cardiovascular: Negative for chest pain, palpitations and leg swelling.  Gastrointestinal: Positive for abdominal pain, blood in stool and diarrhea. Negative for constipation, heartburn, melena, nausea and vomiting.  Genitourinary: Negative for dysuria, frequency, hematuria and urgency.  Musculoskeletal: Negative for back pain, falls and myalgias.  Skin: Negative for rash.  Neurological: Negative for dizziness, sensory change, loss of consciousness, weakness and headaches.  Endo/Heme/Allergies: Negative for environmental allergies. Does not bruise/bleed easily.  Psychiatric/Behavioral: Negative for depression and suicidal ideas. The patient is not nervous/anxious and does not have insomnia.        Objective:    Physical Exam Constitutional:      General: She is not in acute distress.    Appearance: She is not diaphoretic.  HENT:     Head: Normocephalic and atraumatic.     Right Ear: External ear normal.     Left Ear: External ear normal.     Nose: Nose normal.     Mouth/Throat:     Pharynx: No oropharyngeal exudate.  Eyes:     General: No scleral icterus.       Right eye: No discharge.        Left eye: No discharge.     Conjunctiva/sclera: Conjunctivae normal.     Pupils: Pupils are equal, round, and reactive to light.  Neck:     Musculoskeletal: Normal range of motion and neck supple.     Thyroid: No thyromegaly.  Cardiovascular:     Rate and Rhythm: Normal rate and regular rhythm.     Heart sounds: Normal heart sounds. No murmur.  Pulmonary:     Effort: Pulmonary effort is normal. No respiratory distress.     Breath sounds: Normal breath sounds. No wheezing or rales.   Abdominal:     General: Bowel sounds are normal. There is no distension.     Palpations: Abdomen is soft. There is no mass.     Tenderness: There is no abdominal tenderness.  Musculoskeletal: Normal range of motion.        General: No tenderness.  Lymphadenopathy:     Cervical: No cervical adenopathy.  Skin:    General: Skin is warm and dry.     Findings: No rash.  Neurological:     Mental Status: She is alert and oriented to person, place, and time.     Cranial Nerves: No cranial nerve deficit.     Coordination: Coordination normal.     Deep Tendon Reflexes: Reflexes are normal and symmetric. Reflexes normal.     BP 126/84 (BP Location: Left Arm, Patient Position: Sitting, Cuff Size: Normal)   Pulse 81   Temp 98.2 F (36.8 C) (Oral)   Ht 5\' 3"  (1.6 m)   Wt 193 lb (87.5 kg)   BMI 34.19 kg/m  Wt Readings from Last 3 Encounters:  12/18/18 193 lb (87.5 kg)  11/27/18 193 lb (87.5 kg)  09/14/18 188 lb (85.3 kg)    Diabetic Foot Exam - Simple   No data filed     Lab Results  Component Value Date   WBC 8.2 11/27/2018   HGB 15.0 11/27/2018   HCT 44.4 11/27/2018   PLT 411.0 (H) 11/27/2018   GLUCOSE 81 12/13/2017   CHOL 197 12/13/2017   TRIG 151 (H) 12/13/2017   HDL 63 12/13/2017   LDLCALC 104 (H) 12/13/2017   ALT 17 12/13/2017   AST 16 12/13/2017   NA 137 12/13/2017   K 4.4 12/13/2017   CL 99 12/13/2017   CREATININE 0.62 12/13/2017   BUN 9 12/13/2017   CO2  22 12/13/2017   TSH 4.28 07/21/2018   HGBA1C 5.1 12/13/2017    Lab Results  Component Value Date   TSH 4.28 07/21/2018   Lab Results  Component Value Date   WBC 8.2 11/27/2018   HGB 15.0 11/27/2018   HCT 44.4 11/27/2018   MCV 93.9 11/27/2018   PLT 411.0 (H) 11/27/2018   Lab Results  Component Value Date   NA 137 12/13/2017   K 4.4 12/13/2017   CO2 22 12/13/2017   GLUCOSE 81 12/13/2017   BUN 9 12/13/2017   CREATININE 0.62 12/13/2017   BILITOT 0.5 12/13/2017   ALKPHOS 84 12/13/2017   AST 16  12/13/2017   ALT 17 12/13/2017   PROT 6.8 12/13/2017   ALBUMIN 4.2 12/13/2017   CALCIUM 9.6 12/13/2017   ANIONGAP 7 08/14/2017   GFR 119.54 06/29/2017   Lab Results  Component Value Date   CHOL 197 12/13/2017   Lab Results  Component Value Date   HDL 63 12/13/2017   Lab Results  Component Value Date   LDLCALC 104 (H) 12/13/2017   Lab Results  Component Value Date   TRIG 151 (H) 12/13/2017   Lab Results  Component Value Date   CHOLHDL 3.1 12/13/2017   Lab Results  Component Value Date   HGBA1C 5.1 12/13/2017       Assessment & Plan:   Problem List Items Addressed This Visit    Vitamin D deficiency - Primary    Supplement and monitoring      Relevant Orders   VITAMIN D 25 Hydroxy (Vit-D Deficiency, Fractures)   Hypothyroidism    On Levothyroxine, continue to monitor      Relevant Orders   TSH   Endometriosis    Is s/p vaginal hysterectomy but likely has abdominal scar tissue which could be contributing to her abdominal cramping. Will continue to monitor and may need further evaluation by GYN if pain persists      IBS (irritable bowel syndrome)    Has colonoscopy later this week. Encouraged to add Benefiber bid and avoid offending foods. Given Hyoscyamine to use prn when cramping occurs.       Relevant Medications   hyoscyamine (LEVSIN SL) 0.125 MG SL tablet   Preventative health care    Patient encouraged to maintain heart healthy diet, regular exercise, adequate sleep. Consider daily probiotics. Take medications as prescribed. Follows with GYN. Colonoscopy this week      Grief reaction    Reports she is doing much better at the current time very little need for Alprazolam prn.      Hyperglycemia    hgba1c acceptable, minimize simple carbs. Increase exercise as tolerated.       Relevant Orders   Comprehensive metabolic panel   Hemoglobin A1c   Hyperlipidemia    Encouraged heart healthy diet, increase exercise, avoid trans fats, consider a krill  oil cap daily      Relevant Orders   Lipid panel   Rectal bleeding   Relevant Orders   CBC      I am having Evanny M. Dray start on hyoscyamine. I am also having her maintain her fluticasone, cetirizine, acetaminophen, valACYclovir, Synthroid, metoprolol succinate, and ALPRAZolam.  Meds ordered this encounter  Medications  . hyoscyamine (LEVSIN SL) 0.125 MG SL tablet    Sig: Place 1 tablet (0.125 mg total) under the tongue every 4 (four) hours as needed.    Dispense:  30 tablet    Refill:  0  Penni Homans, MD

## 2018-12-18 NOTE — Assessment & Plan Note (Signed)
Has colonoscopy later this week. Encouraged to add Benefiber bid and avoid offending foods. Given Hyoscyamine to use prn when cramping occurs.

## 2018-12-18 NOTE — Assessment & Plan Note (Signed)
Encouraged heart healthy diet, increase exercise, avoid trans fats, consider a krill oil cap daily 

## 2018-12-18 NOTE — Assessment & Plan Note (Signed)
On Levothyroxine, continue to monitor 

## 2018-12-18 NOTE — Assessment & Plan Note (Signed)
Is s/p vaginal hysterectomy but likely has abdominal scar tissue which could be contributing to her abdominal cramping. Will continue to monitor and may need further evaluation by GYN if pain persists

## 2018-12-18 NOTE — Assessment & Plan Note (Signed)
Patient encouraged to maintain heart healthy diet, regular exercise, adequate sleep. Consider daily probiotics. Take medications as prescribed. Follows with GYN. Colonoscopy this week

## 2018-12-18 NOTE — Assessment & Plan Note (Signed)
Reports she is doing much better at the current time very little need for Alprazolam prn.

## 2018-12-19 ENCOUNTER — Telehealth: Payer: Self-pay | Admitting: *Deleted

## 2018-12-19 NOTE — Telephone Encounter (Signed)
Covid-19 screening questions  Have you traveled in the last 14 days?no If yes where?  Do you now or have you had a fever in the last 14 days?no  Do you have any respiratory symptoms of shortness of breath or cough now or in the last 14 days?no  Do you have any family members or close contacts with diagnosed or suspected Covid-19 in the past 14 days?no  Have you been tested for Covid-19 and found to be positive?no  Pt made aware of care partner policy and will bring a mask.SM

## 2018-12-21 ENCOUNTER — Other Ambulatory Visit: Payer: Self-pay

## 2018-12-21 ENCOUNTER — Ambulatory Visit (AMBULATORY_SURGERY_CENTER): Payer: No Typology Code available for payment source | Admitting: Gastroenterology

## 2018-12-21 ENCOUNTER — Encounter: Payer: Self-pay | Admitting: Gastroenterology

## 2018-12-21 VITALS — BP 110/80 | HR 71 | Temp 79.9°F | Resp 22 | Ht 63.0 in | Wt 193.0 lb

## 2018-12-21 DIAGNOSIS — K635 Polyp of colon: Secondary | ICD-10-CM

## 2018-12-21 DIAGNOSIS — K921 Melena: Secondary | ICD-10-CM

## 2018-12-21 DIAGNOSIS — D120 Benign neoplasm of cecum: Secondary | ICD-10-CM

## 2018-12-21 DIAGNOSIS — K640 First degree hemorrhoids: Secondary | ICD-10-CM | POA: Diagnosis not present

## 2018-12-21 DIAGNOSIS — D122 Benign neoplasm of ascending colon: Secondary | ICD-10-CM

## 2018-12-21 DIAGNOSIS — D124 Benign neoplasm of descending colon: Secondary | ICD-10-CM

## 2018-12-21 MED ORDER — SODIUM CHLORIDE 0.9 % IV SOLN: 500.0000 mL | Freq: Once | INTRAVENOUS | Status: DC

## 2018-12-21 NOTE — Progress Notes (Signed)
A/ox3, pleased with MAC, report to RN 

## 2018-12-21 NOTE — Progress Notes (Signed)
Called to room to assist during endoscopic procedure.  Patient ID and intended procedure confirmed with present staff. Received instructions for my participation in the procedure from the performing physician.  

## 2018-12-21 NOTE — Op Note (Signed)
[facility] Patient Name: Katie Henry Procedure Date: 12/21/2018 9:37 AM MRN: [id number] Endoscopist: Gerrit Heck , MD Age: 48 Referring MD:  Date of Birth: 1971-06-03 Gender: Female Account #: [id number] Procedure:                Colonoscopy Indications:              This is the patient's first colonoscopy,                            Hematochezia, Hemorrhoids Medicines:                Monitored Anesthesia Care Procedure:                Pre-Anesthesia Assessment:                           - Prior to the procedure, a History and Physical                            was performed, and patient medications and                            allergies were reviewed. The patient's tolerance of                            previous anesthesia was also reviewed. The risks                            and benefits of the procedure and the sedation                            options and risks were discussed with the patient.                            All questions were answered, and informed consent                            was obtained. Prior Anticoagulants: The patient has                            taken no previous anticoagulant or antiplatelet                            agents. ASA Grade Assessment: II - A patient with                            mild systemic disease. After reviewing the risks                            and benefits, the patient was deemed in                            satisfactory condition to undergo the procedure.  After obtaining informed consent, the colonoscope                            was passed under direct vision. Throughout the                            procedure, the patient's blood pressure, pulse, and                            oxygen saturations were monitored continuously. The                            Colonoscope was introduced through the anus and                            advanced to the the terminal ileum. The  colonoscopy                            was performed without difficulty. The patient                            tolerated the procedure well. The quality of the                            bowel preparation was adequate. The terminal ileum,                            ileocecal valve, appendiceal orifice, and rectum                            were photographed. Scope In: 10:00:21 AM Scope Out: 10:19:32 AM Scope Withdrawal Time: 0 hours 15 minutes 52 seconds  Total Procedure Duration: 0 hours 19 minutes 11 seconds  Findings:                 Hemorrhoids were found on perianal exam.                           Three sessile polyps were found in the descending                            colon, ascending colon and cecum. The polyps were 2                            to 6 mm in size. These polyps were removed with a                            cold snare. Resection and retrieval were complete.                            Estimated blood loss was minimal.                           Retroflexion in the right colon was performed.  Non-bleeding internal hemorrhoids were found during                            retroflexion. The hemorrhoids were small.                           The terminal ileum appeared normal. Complications:            No immediate complications. Estimated Blood Loss:     Estimated blood loss was minimal. Impression:               - Hemorrhoids found on perianal exam.                           - Three 2 to 6 mm polyps in the descending colon,                            in the ascending colon and in the cecum, removed                            with a cold snare. Resected and retrieved.                           - Non-bleeding internal hemorrhoids.                           - The examined portion of the ileum was normal. Recommendation:           - Patient has a contact number available for                            emergencies. The signs and symptoms of  potential                            delayed complications were discussed with the                            patient. Return to normal activities tomorrow.                            Written discharge instructions were provided to the                            patient.                           - Resume previous diet.                           - Continue present medications.                           - Await pathology results.                           - Repeat colonoscopy for surveillance of multiple  polyps.                           - Return to GI clinic at appointment to be                            scheduled.                           - Use fiber, for example Citrucel, Fibercon, Konsyl                            or Metamucil.                           - Internal hemorrhoids were noted on this study and                            may be amenable to hemorrhoid band ligation. If you                            are interested in further treatment of these                            hemorrhoids with band ligation, please contact my                            clinic to set up an appointment for evaluation and                            treatment. Gerrit Heck, MD 12/21/2018 10:25:47 AM

## 2018-12-21 NOTE — Patient Instructions (Signed)
3 polyps removed today.  Wait for pathology to return.  You will receive a letter from Dr Bryan Lemma explaining the type of polyps you had today.  Then he will address when you need to come back for next colonoscopy.    YOU HAD AN ENDOSCOPIC PROCEDURE TODAY AT [facility]:   Refer to the procedure report that was given to you for any specific questions about what was found during the examination.  If the procedure report does not answer your questions, please call your gastroenterologist to clarify.  If you requested that your care partner not be given the details of your procedure findings, then the procedure report has been included in a sealed envelope for you to review at your convenience later.  YOU SHOULD EXPECT: Some feelings of bloating in the abdomen. Passage of more gas than usual.  Walking can help get rid of the air that was put into your GI tract during the procedure and reduce the bloating. If you had a lower endoscopy (such as a colonoscopy or flexible sigmoidoscopy) you may notice spotting of blood in your stool or on the toilet paper. If you underwent a bowel prep for your procedure, you may not have a normal bowel movement for a few days.  Please Note:  You might notice some irritation and congestion in your nose or some drainage.  This is from the oxygen used during your procedure.  There is no need for concern and it should clear up in a day or so.  SYMPTOMS TO REPORT IMMEDIATELY:   Following lower endoscopy (colonoscopy or flexible sigmoidoscopy):  Excessive amounts of blood in the stool  Significant tenderness or worsening of abdominal pains  Swelling of the abdomen that is new, acute  Fever of 100F or higher   For urgent or emergent issues, a gastroenterologist can be reached at any hour by calling [phone].   DIET:  We do recommend a small meal at first, but then you may proceed to your regular diet.  Drink plenty of fluids but you should avoid  alcoholic beverages for 24 hours.  ACTIVITY:  You should plan to take it easy for the rest of today and you should NOT DRIVE or use heavy machinery until tomorrow (because of the sedation medicines used during the test).    FOLLOW UP: Our staff will call the number listed on your records 48-72 hours following your procedure to check on you and address any questions or concerns that you may have regarding the information given to you following your procedure. If we do not reach you, we will leave a message.  We will attempt to reach you two times.  During this call, we will ask if you have developed any symptoms of COVID 19. If you develop any symptoms (ie: fever, flu-like symptoms, shortness of breath, cough etc.) before then, please call [phone].  If you test positive for Covid 19 in the 2 weeks post procedure, please call and report this information to [place].    If any biopsies were taken you will be contacted by phone or by letter within the next 1-3 weeks.  Please call us at [phone] if you have not heard about the biopsies in 3 weeks.    SIGNATURES/CONFIDENTIALITY: You and/or your care partner have signed paperwork which will be entered into your electronic medical record.  These signatures attest to the fact that that the information above on your After Visit Summary has been reviewed and is  understood.  Full responsibility of the confidentiality of this discharge information lies with you and/or your care-partner. 

## 2018-12-25 ENCOUNTER — Telehealth: Payer: Self-pay | Admitting: *Deleted

## 2018-12-25 NOTE — Telephone Encounter (Signed)
  Follow up Call-  Call back number 12/21/2018  Post procedure Call Back phone  # [phone]  Permission to leave phone message Yes  Some recent data might be hidden     Patient questions:  Do you have a fever, pain , or abdominal swelling? No. Pain Score  0 *  Have you tolerated food without any problems? Yes.    Have you been able to return to your normal activities? Yes.    Do you have any questions about your discharge instructions: Diet   No. Medications  No. Follow up visit  No.  Do you have questions or concerns about your Care? No.  Actions: * If pain score is 4 or above: No action needed, pain <4.  1. Have you developed a fever since your procedure? no  2.   Have you had an respiratory symptoms (SOB or cough) since your procedure? no  3.   Have you tested positive for COVID 19 since your procedure no  4.   Have you had any family members/close contacts diagnosed with the COVID 19 since your procedure?  no   If yes to any of these questions please route to Joylene John, RN and Alphonsa Gin, Therapist, sports.

## 2018-12-26 ENCOUNTER — Encounter: Payer: Self-pay | Admitting: Gastroenterology

## 2019-01-12 MED FILL — SYNTHROID 175 MCG TABLET: 175 | 30 days supply | Qty: 30 | Fill #5

## 2019-01-15 ENCOUNTER — Encounter (HOSPITAL_BASED_OUTPATIENT_CLINIC_OR_DEPARTMENT_OTHER): Payer: Self-pay | Admitting: *Deleted

## 2019-01-15 ENCOUNTER — Other Ambulatory Visit: Payer: Self-pay

## 2019-01-15 ENCOUNTER — Emergency Department (HOSPITAL_BASED_OUTPATIENT_CLINIC_OR_DEPARTMENT_OTHER)
Admission: EM | Admit: 2019-01-15 | Discharge: 2019-01-15 | Disposition: A | Discharge status: Home / Self Care | Payer: No Typology Code available for payment source | Attending: Emergency Medicine | Admitting: Emergency Medicine

## 2019-01-15 DIAGNOSIS — E860 Dehydration: Secondary | ICD-10-CM | POA: Insufficient documentation

## 2019-01-15 DIAGNOSIS — R42 Dizziness and giddiness: Secondary | ICD-10-CM | POA: Diagnosis present

## 2019-01-15 DIAGNOSIS — Z79899 Other long term (current) drug therapy: Secondary | ICD-10-CM | POA: Diagnosis not present

## 2019-01-15 DIAGNOSIS — E890 Postprocedural hypothyroidism: Secondary | ICD-10-CM | POA: Insufficient documentation

## 2019-01-15 DIAGNOSIS — Z87891 Personal history of nicotine dependence: Secondary | ICD-10-CM | POA: Diagnosis not present

## 2019-01-15 LAB — COMPREHENSIVE METABOLIC PANEL
ALT: 36 U/L (ref 0–44)
AST: 27 U/L (ref 15–41)
Albumin: 3.8 g/dL (ref 3.5–5.0)
Alkaline Phosphatase: 72 U/L (ref 38–126)
Anion gap: 12 (ref 5–15)
BUN: 10 mg/dL (ref 6–20)
CO2: 20 mmol/L — ABNORMAL LOW (ref 22–32)
Calcium: 9.1 mg/dL (ref 8.9–10.3)
Chloride: 106 mmol/L (ref 98–111)
Creatinine, Ser: 0.52 mg/dL (ref 0.44–1.00)
GFR calc Af Amer: >60 mL/min (ref >60)
GFR calc non Af Amer: >60 mL/min (ref >60)
Glucose, Bld: 119 mg/dL — ABNORMAL HIGH (ref 70–99)
Potassium: 3.6 mmol/L (ref 3.5–5.1)
Sodium: 138 mmol/L (ref 135–145)
Total Bilirubin: 0.6 mg/dL (ref 0.3–1.2)
Total Protein: 6.8 g/dL (ref 6.5–8.1)

## 2019-01-15 LAB — URINALYSIS, ROUTINE W REFLEX MICROSCOPIC
Bilirubin Urine: NEGATIVE
Glucose, UA: NEGATIVE mg/dL
Hgb urine dipstick: NEGATIVE
Ketones, ur: NEGATIVE mg/dL
Leukocytes,Ua: NEGATIVE
Nitrite: NEGATIVE
Protein, ur: NEGATIVE mg/dL
Specific Gravity, Urine: <1.005 — ABNORMAL LOW (ref 1.005–1.030)
pH: 6 (ref 5.0–8.0)

## 2019-01-15 LAB — CBC WITH DIFFERENTIAL/PLATELET
Abs Immature Granulocytes: 0.02 10*3/uL (ref 0.00–0.07)
Basophils Absolute: 0.1 10*3/uL (ref 0.0–0.1)
Basophils Relative: 1 %
Eosinophils Absolute: 0.2 10*3/uL (ref 0.0–0.5)
Eosinophils Relative: 2 %
HCT: 46.3 % — ABNORMAL HIGH (ref 36.0–46.0)
Hemoglobin: 14.9 g/dL (ref 12.0–15.0)
Immature Granulocytes: 0 %
Lymphocytes Relative: 21 %
Lymphs Abs: 1.6 10*3/uL (ref 0.7–4.0)
MCH: 30.8 pg (ref 26.0–34.0)
MCHC: 32.2 g/dL (ref 30.0–36.0)
MCV: 95.9 fL (ref 80.0–100.0)
Monocytes Absolute: 0.6 10*3/uL (ref 0.1–1.0)
Monocytes Relative: 8 %
Neutro Abs: 5.2 10*3/uL (ref 1.7–7.7)
Neutrophils Relative %: 68 %
Platelets: 381 10*3/uL (ref 150–400)
RBC: 4.83 MIL/uL (ref 3.87–5.11)
RDW: 11.8 % (ref 11.5–15.5)
WBC: 7.6 10*3/uL (ref 4.0–10.5)
nRBC: 0 % (ref 0.0–0.2)

## 2019-01-15 MED ORDER — SODIUM CHLORIDE 0.9 % IV BOLUS
1000.0000 mL | Freq: Once | INTRAVENOUS | Status: AC
  Administered 2019-01-15: 08:00:00 1000 mL via INTRAVENOUS

## 2019-01-15 MED ORDER — PROMETHAZINE HCL 25 MG PO TABS: 25.0000 mg | ORAL_TABLET | Freq: Four times a day (QID) | ORAL | 0 refills | Status: DC | PRN

## 2019-01-15 MED ORDER — METHOCARBAMOL 500 MG PO TABS: 500.0000 mg | ORAL_TABLET | Freq: Two times a day (BID) | ORAL | 0 refills | Status: DC

## 2019-01-15 MED ORDER — LORAZEPAM 2 MG/ML IJ SOLN
1.0000 mg | Freq: Once | INTRAMUSCULAR | Status: AC
  Administered 2019-01-15: 1 mg via INTRAVENOUS
  Filled 2019-01-15: qty 1

## 2019-01-15 MED ORDER — MECLIZINE HCL 25 MG PO TABS
25.0000 mg | ORAL_TABLET | Freq: Once | ORAL | Status: AC
  Administered 2019-01-15: 25 mg via ORAL
  Filled 2019-01-15: qty 1

## 2019-01-15 MED ORDER — MECLIZINE HCL 25 MG PO TABS: 25.0000 mg | ORAL_TABLET | Freq: Three times a day (TID) | ORAL | 0 refills | Status: DC | PRN

## 2019-01-15 MED ORDER — PROMETHAZINE HCL 25 MG/ML IJ SOLN
12.5000 mg | Freq: Once | INTRAMUSCULAR | Status: AC
  Administered 2019-01-15: 12.5 mg via INTRAVENOUS
  Filled 2019-01-15: qty 1

## 2019-01-15 MED FILL — MECLIZINE 25 MG TABLET: 25 | 10 days supply | Qty: 30 | Fill #0

## 2019-01-15 MED FILL — METHOCARBAMOL 500 MG TABLET: 500 | 10 days supply | Qty: 20 | Fill #0

## 2019-01-15 MED FILL — PROMETHAZINE 25 MG TABLET: 25 | 7 days supply | Qty: 30 | Fill #0

## 2019-01-15 NOTE — ED Provider Notes (Signed)
[facility] EMERGENCY DEPARTMENT Provider Note   CSN: [id number] Arrival date & time: 01/15/19  1829    History   Chief Complaint Chief Complaint  Patient presents with  . Dizziness    HPI Katie Henry is a 48 y.o. female.     Pt presents to the ED today with dizziness and feeling off balance.  She has had these sx before with vertigo and with migraines, but feels more like her migraine aura. The pt said she has had no fever.  No sob/cough.  No known covid exposures.       Past Medical History:  Diagnosis Date  . Dental infection 05/28/2011  . Endometriosis   . Fainting episodes    Dr Luther Parody related  . Graves disease   . Grief reaction 11/15/2016  . History of kidney stones 2003   onset with pregnacy   . History of UTI    last 4-5-weeks ago  . Hyperglycemia 11/15/2016   cbg 100  . Hyperthyroidism   . 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 IBS (irritable bowel syndrome)   . Migraine   . Syncope   . Vitamin D deficiency     Patient Active Problem List   Diagnosis Date Noted  . Tinea corporis 11/27/2018  . Rectal bleeding 11/27/2018  . Hyperlipidemia 12/13/2017  . Plantar fasciitis 01/11/2017  . Tailor's bunion 01/11/2017  . Grief reaction 11/15/2016  . Hyperglycemia 11/15/2016  . Preventative health care 10/14/2013  . Graves disease   . Endometriosis   . IBS (irritable bowel syndrome)   . Syncope   . Heel pain 07/17/2012  . Migraines 01/13/2012  . Kidney stones 01/13/2012  . Breast cancer screening 07/08/2011  . Vitamin D deficiency 05/23/2011  . Hypothyroidism 05/23/2011  . Anxiety and depression 05/23/2011    Past Surgical History:  Procedure Laterality Date  .  treatment  1998  . APPENDECTOMY  1991  . CYSTOSCOPY/URETEROSCOPY/HOLMIUM LASER/STENT PLACEMENT Left 08/09/2017   Procedure: CYSTOSCOPY/RETROGRADE/URETEROSCOPY/HOLMIUM LASER/STENT PLACEMENT;  Surgeon: Festus Aloe, MD;  Location: [facility];  Service:  Urology;  Laterality: Left;  ONLY NEEDS 60 MIN  . ENDOMETRIAL ABLATION  01/2007  . EXTRACORPOREAL SHOCK WAVE LITHOTRIPSY Left 07/21/2017   Procedure: LEFT EXTRACORPOREAL SHOCK WAVE LITHOTRIPSY (ESWL);  Surgeon: Bjorn Loser, MD;  Location: [facility];  Service: Urology;  Laterality: Left;  Marland Kitchen VAGINAL HYSTERECTOMY  2010   ovaries still in place     OB History   No obstetric history on file.      Home Medications    Prior to Admission medications   Medication Sig Start Date End Date Taking? Authorizing Provider  acetaminophen (TYLENOL) 500 MG tablet Take 1,000 mg by mouth every 6 (six) hours as needed for mild pain or moderate pain.    [provider]  ALPRAZolam Duanne Moron) 0.25 MG tablet Take 1 tablet (0.25 mg total) by mouth 2 (two) times daily as needed for anxiety or sleep. 09/14/18   [name], MD  cetirizine (ZYRTEC) 10 MG tablet Take 1 tablet (10 mg total) by mouth daily. Patient taking differently: Take 10 mg by mouth daily as needed for allergies.  06/21/16   Shelda Pal, DO  fluticasone (FLONASE) 50 MCG/ACT nasal spray Place 2 sprays into both nostrils daily. 06/21/16   Shelda Pal, DO  hyoscyamine (LEVSIN SL) 0.125 MG SL tablet Place 1 tablet (0.125 mg total) under the tongue every 4 (four) hours as needed. Patient not taking: Reported  on 12/21/2018 12/18/18   [name], MD  meclizine (ANTIVERT) 25 MG tablet Take 1 tablet (25 mg total) by mouth 3 (three) times daily as needed for dizziness. 01/15/19   Isla Pence, MD  methocarbamol (ROBAXIN) 500 MG tablet Take 1 tablet (500 mg total) by mouth 2 (two) times daily. 01/15/19   Isla Pence, MD  metoprolol succinate (TOPROL-XL) 25 MG 24 hr tablet Take 1 tablet (25 mg total) by mouth daily. Patient not taking: Reported on 12/21/2018 09/14/18   [name], MD  promethazine (PHENERGAN) 25 MG tablet Take 1 tablet (25 mg total) by mouth every 6 (six) hours as needed for nausea or vomiting. 01/15/19    Isla Pence, MD  SYNTHROID 175 MCG tablet TAKE 1 TABLET BY MOUTH DAILY BEFORE BREAKFAST 07/20/18   [name], MD  valACYclovir (VALTREX) 1000 MG tablet 2 tablet BID for 1 day 05/31/18   Chevis Pretty, FNP    Family History Family History  Problem Relation Age of Onset  . Hyperlipidemia Mother   . Heart disease Mother        bicuspid mitral valve  . Hyperlipidemia Maternal Grandmother   . Diabetes Maternal Grandmother   . Kidney disease Maternal Grandmother   . Hyperlipidemia Maternal Grandfather   . Heart disease Maternal Grandfather 54       MI  . Prostate cancer Maternal Grandfather   . Hypertension Father   . Dementia Father   . Seizures Father        s/p TBI  . Hypertension Paternal Grandfather   . Graves' disease Sister   . Obesity Brother   . Prostate cancer Other        maternal and paternal grandparents  . Heart attack Maternal Aunt        deceased age 9  . Heart disease Maternal Aunt   . Colon cancer Neg Hx   . Esophageal cancer Neg Hx     Social History Social History   Tobacco Use  . Smoking status: Former Smoker    Packs/day: 0.25    Years: 6.00    Pack years: 1.50    Types: Cigarettes  . Smokeless tobacco: Never Used  . Tobacco comment: quit 2010  Substance Use Topics  . Alcohol use: Yes    Frequency: Never    Comment: Ocassionally  . Drug use: No     Allergies   Zofran [ondansetron hcl], Amoxicillin, Codeine, Imitrex [sumatriptan], and Ultram [tramadol hcl]   Review of Systems Review of Systems  Gastrointestinal: Positive for nausea.  Neurological: Positive for dizziness.  All other systems reviewed and are negative.    Physical Exam Updated Vital Signs BP (!) 139/96 (BP Location: Left Arm)   Pulse 91   Temp 98 F (36.7 C) (Oral)   Resp 20   Ht 5\' 3"  (1.6 m)   Wt 87.5 kg   SpO2 100%   BMI 34.19 kg/m   Physical Exam Vitals signs and nursing note reviewed.  Constitutional:      Appearance: Normal appearance.   HENT:     Head: Normocephalic.     Right Ear: Tympanic membrane, ear canal and external ear normal.     Left Ear: Tympanic membrane, ear canal and external ear normal.     Nose: Nose normal.     Mouth/Throat:     Mouth: Mucous membranes are moist.     Pharynx: Oropharynx is clear.  Eyes:     Extraocular Movements: Extraocular movements intact.  Conjunctiva/sclera: Conjunctivae normal.     Pupils: Pupils are equal, round, and reactive to light.  Neck:     Musculoskeletal: Normal range of motion and neck supple.  Cardiovascular:     Rate and Rhythm: Normal rate and regular rhythm.     Pulses: Normal pulses.     Heart sounds: Normal heart sounds.  Pulmonary:     Effort: Pulmonary effort is normal.     Breath sounds: Normal breath sounds.  Abdominal:     General: Abdomen is flat. Bowel sounds are normal.     Palpations: Abdomen is soft.  Musculoskeletal: Normal range of motion.  Skin:    General: Skin is warm and dry.     Capillary Refill: Capillary refill takes less than 2 seconds.  Neurological:     General: No focal deficit present.     Mental Status: She is alert and oriented to person, place, and time.  Psychiatric:        Mood and Affect: Mood normal.        Behavior: Behavior normal.      ED Treatments / Results  Labs (all labs ordered are listed, but only abnormal results are displayed) Labs Reviewed  COMPREHENSIVE METABOLIC PANEL - Abnormal; Notable for the following components:      Result Value   CO2 20 (*)    Glucose, Bld 119 (*)    All other components within normal limits  CBC WITH DIFFERENTIAL/PLATELET - Abnormal; Notable for the following components:   HCT 46.3 (*)    All other components within normal limits  URINALYSIS, ROUTINE W REFLEX MICROSCOPIC - Abnormal; Notable for the following components:   APPearance HAZY (*)    Specific Gravity, Urine <1.005 (*)    All other components within normal limits    EKG EKG Interpretation  Date/Time:   Monday January 15 2019 07:49:22 EDT Ventricular Rate:  82 PR Interval:    QRS Duration: 91 QT Interval:  386 QTC Calculation: 451 R Axis:   77 Text Interpretation:  Sinus rhythm No significant change since last tracing Confirmed by Isla Pence [phone]) on 01/15/2019 7:53:29 AM   Radiology No results found.  Procedures Procedures (including critical care time)  Medications Ordered in ED Medications  sodium chloride 0.9 % bolus 1,000 mL ( Intravenous Stopped 01/15/19 0854)  promethazine (PHENERGAN) injection 12.5 mg (12.5 mg Intravenous Given 01/15/19 0801)  LORazepam (ATIVAN) injection 1 mg (1 mg Intravenous Given 01/15/19 0800)  meclizine (ANTIVERT) tablet 25 mg (25 mg Oral Given 01/15/19 0759)     Initial Impression / Assessment and Plan / ED Course  I have reviewed the triage vital signs and the nursing notes.  Pertinent labs & imaging results that were available during my care of the patient were reviewed by me and considered in my medical decision making (see chart for details).       Pt is feeling much better.  She is able to tolerate po fluids.  She knows to return if worse and to f/u with pcp.  Final Clinical Impressions(s) / ED Diagnoses   Final diagnoses:  Dizziness  Dehydration    ED Discharge Orders         Ordered    meclizine (ANTIVERT) 25 MG tablet  3 times daily PRN     01/15/19 1036    promethazine (PHENERGAN) 25 MG tablet  Every 6 hours PRN     01/15/19 1036    methocarbamol (ROBAXIN) 500 MG tablet  2 times daily  01/15/19 Beaulieu, Noelene Gang, MD 01/15/19 1037

## 2019-01-15 NOTE — ED Triage Notes (Signed)
Pt reports waking up this am and feeling dizzy, with nausea, states she felt fine yesterday. Pt states this feels similar to her migraine aura, which she usually has for days before her migraine presents. Pt also states she has had vertigo in the past but this feels different from that. Denies fever, sore throat, cough or any other c/o. Dr. Gilford Raid at bedside for exam.

## 2019-01-18 ENCOUNTER — Encounter: Payer: Self-pay | Admitting: Family Medicine

## 2019-01-18 ENCOUNTER — Ambulatory Visit (INDEPENDENT_AMBULATORY_CARE_PROVIDER_SITE_OTHER): Payer: No Typology Code available for payment source | Admitting: Family Medicine

## 2019-01-18 ENCOUNTER — Other Ambulatory Visit: Payer: Self-pay

## 2019-01-18 DIAGNOSIS — G43909 Migraine, unspecified, not intractable, without status migrainosus: Secondary | ICD-10-CM | POA: Diagnosis not present

## 2019-01-18 DIAGNOSIS — F4321 Adjustment disorder with depressed mood: Secondary | ICD-10-CM | POA: Diagnosis not present

## 2019-01-18 DIAGNOSIS — R739 Hyperglycemia, unspecified: Secondary | ICD-10-CM

## 2019-01-18 NOTE — Assessment & Plan Note (Signed)
hgba1c acceptable, minimize simple carbs. Increase exercise as tolerated.  

## 2019-01-18 NOTE — Progress Notes (Signed)
Subjective:    Patient ID: Katie Henry, female    DOB: 1971-02-05, 48 y.o.   MRN: [id number]  Chief Complaint  Patient presents with  . Follow-up    hospital follo up     HPI Patient is in today for emergency room follow-up for migraine headache.  She presented to the emergency room with sudden onset of headache with neurologic symptoms 2 days ago.  She had a major sense of disequilibrium to the point where she could not stand but no actual spinning.  She had presyncope and felt very lightheaded as if she could pass out.  She also noted her legs felt weak and she was unable to function.  She had been under a great deal of stress as her cousin and her cousin's daughter had been diagnosed with COVID and her niece has had multiple brainstem strokes as a result and is on life support.  Her mother-in-law's sister with whom she lives has been diagnosed with COVID is well although her mother-in-law is so far negative.  No recent febrile illness or other acute symptoms.  She has had trouble with migraines in the past but had recently been doing very well.  The ER gave her Phenergan which allowed her to go home and sleep and she is had no headaches since that time.  No other acute symptoms noted. Denies CP/palp/SOB/congestion/fevers/GI or GU c/o. Taking meds as prescribed  Past Medical History:  Diagnosis Date  . Dental infection 05/28/2011  . Endometriosis   . Fainting episodes    Dr Luther Parody related  . Graves disease   . Grief reaction 11/15/2016  . History of kidney stones 2003   onset with pregnacy   . History of UTI    last 4-5-weeks ago  . Hyperglycemia 11/15/2016   cbg 100  . Hyperthyroidism   . 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 IBS (irritable bowel syndrome)   . Migraine   . Syncope   . Vitamin D deficiency     Past Surgical History:  Procedure Laterality Date  .  treatment  1998  . APPENDECTOMY  1991  . CYSTOSCOPY/URETEROSCOPY/HOLMIUM LASER/STENT  PLACEMENT Left 08/09/2017   Procedure: CYSTOSCOPY/RETROGRADE/URETEROSCOPY/HOLMIUM LASER/STENT PLACEMENT;  Surgeon: Festus Aloe, MD;  Location: [facility];  Service: Urology;  Laterality: Left;  ONLY NEEDS 60 MIN  . ENDOMETRIAL ABLATION  01/2007  . EXTRACORPOREAL SHOCK WAVE LITHOTRIPSY Left 07/21/2017   Procedure: LEFT EXTRACORPOREAL SHOCK WAVE LITHOTRIPSY (ESWL);  Surgeon: Bjorn Loser, MD;  Location: [facility];  Service: Urology;  Laterality: Left;  Marland Kitchen VAGINAL HYSTERECTOMY  2010   ovaries still in place    Family History  Problem Relation Age of Onset  . Hyperlipidemia Mother   . Heart disease Mother        bicuspid mitral valve  . Hyperlipidemia Maternal Grandmother   . Diabetes Maternal Grandmother   . Kidney disease Maternal Grandmother   . Hyperlipidemia Maternal Grandfather   . Heart disease Maternal Grandfather 67       MI  . Prostate cancer Maternal Grandfather   . Hypertension Father   . Dementia Father   . Seizures Father        s/p TBI  . Hypertension Paternal Grandfather   . Graves' disease Sister   . Obesity Brother   . Prostate cancer Other        maternal and paternal grandparents  . Heart attack Maternal Aunt        deceased age  72  . Heart disease Maternal Aunt   . Colon cancer Neg Hx   . Esophageal cancer Neg Hx     Social History   Socioeconomic History  . Marital status: Married    Spouse name: Not on file  . Number of children: 2  . Years of education: 29  . Highest education level: Not on file  Occupational History    Employer: [employer]  . Financial resource strain: Not on file  . Food insecurity    Worry: Not on file    Inability: Not on file  . Transportation needs    Medical: Not on file    Non-medical: Not on file  Tobacco Use  . Smoking status: Former Smoker    Packs/day: 0.25    Years: 6.00    Pack years: 1.50    Types: Cigarettes  . Smokeless tobacco: Never Used  . Tobacco comment: quit 2010  Substance and  Sexual Activity  . Alcohol use: Yes    Frequency: Never    Comment: Ocassionally  . Drug use: No  . Sexual activity: Yes    Partners: Male    Comment: work at [employer], lives with fiance and son  Lifestyle  . Physical activity    Days per week: Not on file    Minutes per session: Not on file  . Stress: Not on file  Relationships  . Social Herbalist on phone: Not on file    Gets together: Not on file    Attends religious service: Not on file    Active member of club or organization: Not on file    Attends meetings of clubs or organizations: Not on file    Relationship status: Not on file  . Intimate partner violence    Fear of current or ex partner: Not on file    Emotionally abused: Not on file    Physically abused: Not on file    Forced sexual activity: Not on file  Other Topics Concern  . Not on file  Social History Narrative   Engaged, two sons.   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   No Tob, occ alcohol, no drugs.    Outpatient Medications Prior to Visit  Medication Sig Dispense Refill  . acetaminophen (TYLENOL) 500 MG tablet Take 1,000 mg by mouth every 6 (six) hours as needed for mild pain or moderate pain.    Marland Kitchen ALPRAZolam (XANAX) 0.25 MG tablet Take 1 tablet (0.25 mg total) by mouth 2 (two) times daily as needed for anxiety or sleep. 30 tablet 1  . cetirizine (ZYRTEC) 10 MG tablet Take 1 tablet (10 mg total) by mouth daily. (Patient taking differently: Take 10 mg by mouth daily as needed for allergies. ) 30 tablet 0  . fluticasone (FLONASE) 50 MCG/ACT nasal spray Place 2 sprays into both nostrils daily. 16 g 6  . hyoscyamine (LEVSIN SL) 0.125 MG SL tablet Place 1 tablet (0.125 mg total) under the tongue every 4 (four) hours as needed. 30 tablet 0  . meclizine (ANTIVERT) 25 MG tablet Take 1 tablet (25 mg total) by mouth 3 (three) times daily as needed for dizziness. 30 tablet 0  . methocarbamol (ROBAXIN) 500 MG  tablet Take 1 tablet (500 mg total) by mouth 2 (two) times daily. 20 tablet 0  . metoprolol succinate (TOPROL-XL) 25 MG 24 hr tablet Take 1 tablet (25 mg total) by mouth daily.  90 tablet 3  . promethazine (PHENERGAN) 25 MG tablet Take 1 tablet (25 mg total) by mouth every 6 (six) hours as needed for nausea or vomiting. 30 tablet 0  . SYNTHROID 175 MCG tablet TAKE 1 TABLET BY MOUTH DAILY BEFORE BREAKFAST 90 tablet 3  . valACYclovir (VALTREX) 1000 MG tablet 2 tablet BID for 1 day 8 tablet 0   No facility-administered medications prior to visit.     Allergies  Allergen Reactions  . Zofran [Ondansetron Hcl] Other (See Comments)    Pt has hx of migraines; medication increases headaches and nausea   . Amoxicillin Nausea And Vomiting and Other (See Comments)    Has patient had a PCN reaction causing immediate rash, facial/tongue/throat swelling, SOB or lightheadedness with hypotension: Unknown Has patient had a PCN reaction causing severe rash involving mucus membranes or skin necrosis: No Has patient had a PCN reaction that required hospitalization: No Has patient had a PCN reaction occurring within the last 10 years: No If all of the above answers are "NO", then may proceed with Cephalosporin use.   . Codeine Nausea And Vomiting  . Imitrex [Sumatriptan] Nausea And Vomiting  . Ultram [Tramadol Hcl] Nausea And Vomiting    Review of Systems  Constitutional: Positive for malaise/fatigue. Negative for fever.  HENT: Negative for congestion.   Eyes: Negative for blurred vision.  Respiratory: Negative for shortness of breath.   Cardiovascular: Negative for chest pain, palpitations and leg swelling.  Gastrointestinal: Negative for abdominal pain, blood in stool and nausea.  Genitourinary: Negative for dysuria and frequency.  Musculoskeletal: Negative for falls.  Skin: Negative for rash.  Neurological: Positive for dizziness, focal weakness and headaches. Negative for loss of consciousness.   Endo/Heme/Allergies: Negative for environmental allergies.  Psychiatric/Behavioral: Positive for depression. The patient is nervous/anxious.        Objective:    Physical Exam Vitals signs and nursing note reviewed.  Constitutional:      General: She is not in acute distress.    Appearance: She is well-developed.  HENT:     Head: Normocephalic and atraumatic.     Nose: Nose normal.  Eyes:     General:        Right eye: No discharge.        Left eye: No discharge.  Neck:     Musculoskeletal: Normal range of motion and neck supple.  Cardiovascular:     Rate and Rhythm: Normal rate and regular rhythm.     Heart sounds: No murmur.  Pulmonary:     Effort: Pulmonary effort is normal.     Breath sounds: Normal breath sounds.  Abdominal:     General: Bowel sounds are normal.     Palpations: Abdomen is soft.     Tenderness: There is no abdominal tenderness.  Skin:    General: Skin is warm and dry.  Neurological:     Mental Status: She is alert and oriented to person, place, and time.     BP 140/84 (BP Location: Right Arm, Patient Position: Sitting, Cuff Size: Small)   Pulse 93   Temp 98.4 F (36.9 C) (Oral)   Resp 16   Ht 5\' 3"  (1.6 m)   Wt 190 lb (86.2 kg)   SpO2 97%   BMI 33.66 kg/m  Wt Readings from Last 3 Encounters:  01/18/19 190 lb (86.2 kg)  01/15/19 193 lb (87.5 kg)  12/21/18 193 lb (87.5 kg)    Diabetic Foot Exam - Simple   No  data filed     Lab Results  Component Value Date   WBC 7.6 01/15/2019   HGB 14.9 01/15/2019   HCT 46.3 (H) 01/15/2019   PLT 381 01/15/2019   GLUCOSE 119 (H) 01/15/2019   CHOL 197 12/13/2017   TRIG 151 (H) 12/13/2017   HDL 63 12/13/2017   LDLCALC 104 (H) 12/13/2017   ALT 36 01/15/2019   AST 27 01/15/2019   NA 138 01/15/2019   K 3.6 01/15/2019   CL 106 01/15/2019   CREATININE 0.52 01/15/2019   BUN 10 01/15/2019   CO2 20 (L) 01/15/2019   TSH 4.28 07/21/2018   HGBA1C 5.1 12/13/2017    Lab Results  Component Value  Date   TSH 4.28 07/21/2018   Lab Results  Component Value Date   WBC 7.6 01/15/2019   HGB 14.9 01/15/2019   HCT 46.3 (H) 01/15/2019   MCV 95.9 01/15/2019   PLT 381 01/15/2019   Lab Results  Component Value Date   NA 138 01/15/2019   K 3.6 01/15/2019   CO2 20 (L) 01/15/2019   GLUCOSE 119 (H) 01/15/2019   BUN 10 01/15/2019   CREATININE 0.52 01/15/2019   BILITOT 0.6 01/15/2019   ALKPHOS 72 01/15/2019   AST 27 01/15/2019   ALT 36 01/15/2019   PROT 6.8 01/15/2019   ALBUMIN 3.8 01/15/2019   CALCIUM 9.1 01/15/2019   ANIONGAP 12 01/15/2019   GFR 119.54 06/29/2017   Lab Results  Component Value Date   CHOL 197 12/13/2017   Lab Results  Component Value Date   HDL 63 12/13/2017   Lab Results  Component Value Date   LDLCALC 104 (H) 12/13/2017   Lab Results  Component Value Date   TRIG 151 (H) 12/13/2017   Lab Results  Component Value Date   CHOLHDL 3.1 12/13/2017   Lab Results  Component Value Date   HGBA1C 5.1 12/13/2017       Assessment & Plan:   Problem List Items Addressed This Visit    Migraines    Encouraged increased hydration, 64 ounces of clear fluids daily. Minimize alcohol and caffeine. Eat small frequent meals with lean proteins and complex carbs. Avoid high and low blood sugars. Get adequate sleep, 7-8 hours a night. Needs exercise daily preferably in the morning. Was in ER with neurologic symptoms of disequilibrium and presyncope as well as weakness in her legs. She feels much better now. Has been on prophylaxis in past but had been doing well. Was given Phenergan by ER and it helped and will try Excedrin      Grief reaction    Has been under a great deal of stress as a cousin she is very close to has been struggling with Covid and her 62 year old daughter has no brain stem activity after having strokes from Covid. This likely has contributed to her headaches.      Hyperglycemia    hgba1c acceptable, minimize simple carbs. Increase exercise as  tolerated         I am having Kaysen M. Kuriakose maintain her fluticasone, cetirizine, acetaminophen, valACYclovir, Synthroid, metoprolol succinate, ALPRAZolam, hyoscyamine, meclizine, promethazine, and methocarbamol.  No orders of the defined types were placed in this encounter.    Penni Homans, MD

## 2019-01-18 NOTE — Assessment & Plan Note (Signed)
Has been under a great deal of stress as a cousin she is very close to has been struggling with Covid and her 48 year old daughter has no brain stem activity after having strokes from Covid. This likely has contributed to her headaches.

## 2019-01-18 NOTE — Assessment & Plan Note (Signed)
Encouraged increased hydration, 64 ounces of clear fluids daily. Minimize alcohol and caffeine. Eat small frequent meals with lean proteins and complex carbs. Avoid high and low blood sugars. Get adequate sleep, 7-8 hours a night. Needs exercise daily preferably in the morning. Was in ER with neurologic symptoms of disequilibrium and presyncope as well as weakness in her legs. She feels much better now. Has been on prophylaxis in past but had been doing well. Was given Phenergan by ER and it helped and will try Excedrin

## 2019-01-18 NOTE — Patient Instructions (Signed)

## 2019-02-08 ENCOUNTER — Encounter: Payer: Self-pay | Admitting: Family Medicine

## 2019-02-15 ENCOUNTER — Other Ambulatory Visit: Payer: Self-pay

## 2019-02-15 ENCOUNTER — Ambulatory Visit (INDEPENDENT_AMBULATORY_CARE_PROVIDER_SITE_OTHER): Payer: No Typology Code available for payment source | Admitting: Family Medicine

## 2019-02-15 ENCOUNTER — Encounter (INDEPENDENT_AMBULATORY_CARE_PROVIDER_SITE_OTHER): Payer: Self-pay | Admitting: Family Medicine

## 2019-02-15 VITALS — BP 141/85 | HR 84 | Temp 98.4°F | Ht 63.0 in | Wt 195.0 lb

## 2019-02-15 DIAGNOSIS — E559 Vitamin D deficiency, unspecified: Secondary | ICD-10-CM

## 2019-02-15 DIAGNOSIS — R0602 Shortness of breath: Secondary | ICD-10-CM | POA: Diagnosis not present

## 2019-02-15 DIAGNOSIS — E038 Other specified hypothyroidism: Secondary | ICD-10-CM | POA: Diagnosis not present

## 2019-02-15 DIAGNOSIS — R5383 Other fatigue: Secondary | ICD-10-CM | POA: Diagnosis not present

## 2019-02-15 DIAGNOSIS — Z1331 Encounter for screening for depression: Secondary | ICD-10-CM

## 2019-02-15 DIAGNOSIS — E669 Obesity, unspecified: Secondary | ICD-10-CM

## 2019-02-15 DIAGNOSIS — Z0289 Encounter for other administrative examinations: Secondary | ICD-10-CM

## 2019-02-15 DIAGNOSIS — Z6832 Body mass index (BMI) 32.0-32.9, adult: Secondary | ICD-10-CM

## 2019-02-15 DIAGNOSIS — Z9189 Other specified personal risk factors, not elsewhere classified: Secondary | ICD-10-CM | POA: Diagnosis not present

## 2019-02-15 NOTE — Progress Notes (Signed)
Office: [phone]  /  Fax: [phone]   HPI:   Chief Complaint: OBESITY  Katie Henry (MR# [id number]) is a 48 y.o. female who presents on 02/15/2019 for obesity evaluation and treatment. Current BMI is Body mass index is 34.54 kg/m. Angelin has struggled with obesity for years and has been unsuccessful in either losing weight or maintaining long term weight loss. Evalena attended our information session and states she is currently in the action stage of change and ready to dedicate time achieving and maintaining a healthier weight.   Reisha heard about our clinic from a co-worker. For breakfast, she is doing 2-3 boiled eggs and 2 pieces of bacon. For snack, she is doing peanut butter crackers or an apple with peanut butter (around 9:30/10 am). For lunch, she is doing leftovers, a wrap with tuns (1/2 can), with sweet pickles, mustard, salsa and chips, or a bag of chips. For dinner, she is doing fried chicken or fast food.  Lona states her family eats meals together she thinks her family will eat healthier with  her her desired weight loss is 25-30 lbs she started gaining weight in 2018 her heaviest weight ever was 204 lbs she has significant food cravings issues  she is trying to eat vegetarian she struggles with emotional eating    Fatigue Rachell feels her energy is lower than it should be. This has worsened with weight gain and has not worsened recently. Mattisyn admits to daytime somnolence and  admits to waking up still tired. Patient is at risk for obstructive sleep apnea. Patent has a history of symptoms of daytime fatigue. Patient generally gets 7 hours of sleep per night, and states they generally have generally restful sleep. Snoring is present. Apneic episodes are not present. Epworth Sleepiness Score is 2.  Dyspnea on exertion Jerolene notes increasing shortness of breath with exercising and seems to be worsening over time with weight gain. She notes getting out of breath  sooner with activity than she used to. This has not gotten worse recently. EKG was done on 01/16/2019, showing normal sinus rhythm. Brionne denies orthopnea.  Vitamin D Deficiency Ebone has a diagnosis of vitamin D deficiency. She is currently taking OTC Vit D 5,000 IU daily. She denies nausea, vomiting or muscle weakness.  At risk for osteopenia and osteoporosis Nanea is at higher risk of osteopenia and osteoporosis due to vitamin D deficiency.   Hypothyroidism Georgette has a diagnosis of hypothyroidism. She is currently on levothyroxine. She denies hot or cold intolerance or palpitations, but does admit to ongoing fatigue.  Depression Screen Evangelina's Food and Mood (modified PHQ-9) score was  Depression screen PHQ 2/9 02/15/2019  Decreased Interest 0  Down, Depressed, Hopeless 0  PHQ - 2 Score 0  Altered sleeping 0  Tired, decreased energy 1  Change in appetite 1  Feeling bad or failure about yourself  0  Trouble concentrating 0  Moving slowly or fidgety/restless 0  Suicidal thoughts 0  PHQ-9 Score 2  Difficult doing work/chores Not difficult at all    ASSESSMENT AND PLAN:  Other fatigue - Plan: Hemoglobin A1c, Insulin, random  Shortness of breath on exertion - Plan: Lipid Panel With LDL/HDL Ratio  Vitamin D deficiency - Plan: VITAMIN D 25 Hydroxy (Vit-D Deficiency, Fractures), Vitamin B12, Folate  Other specified hypothyroidism - Plan: T3, T4, free, TSH  Depression screening  At risk for osteoporosis  Class 1 obesity with serious comorbidity and body mass index (BMI) of 32.0 to 32.9 in  adult, unspecified obesity type  PLAN:  Fatigue Leily was informed that her fatigue may be related to obesity, depression or many other causes. Labs will be ordered, and in the meanwhile Acelyn has agreed to work on diet, exercise and weight loss to help with fatigue. Proper sleep hygiene was discussed including the need for 7-8 hours of quality sleep each night. A sleep study was not  ordered based on symptoms and Epworth score.  Dyspnea on exertion Jaylin's shortness of breath appears to be obesity related and exercise induced. She has agreed to work on weight loss and gradually increase exercise to treat her exercise induced shortness of breath. If Adasia follows our instructions and loses weight without improvement of her shortness of breath, we will plan to refer to pulmonology. We will monitor this condition regularly. Iyah agrees to this plan.  Vitamin D Deficiency Alazia was informed that low vitamin D levels contributes to fatigue and are associated with obesity, breast, and colon cancer. Nazli agrees to continue taking OTC Vit D and will follow up for routine testing of vitamin D, at least 2-3 times per year. She was informed of the risk of over-replacement of vitamin D and agrees to not increase her dose unless she discusses this with [place] first. We will check Vit D level today. Kateryna agrees to follow up with our clinic in 2 weeks.  At risk for osteopenia and osteoporosis Shequila was given extended (15 minutes) osteoporosis prevention counseling today. Lakya is at risk for osteopenia and osteoporsis due to her vitamin D deficiency. She was encouraged to take her vitamin D and follow her higher calcium diet and increase strengthening exercise to help strengthen her bones and decrease her risk of osteopenia and osteoporosis.  Hypothyroidism Layana was informed of the importance of good thyroid control to help with weight loss efforts. She was also informed that supertheraputic thyroid levels are dangerous and will not improve weight loss results. We will check thyroid panel today. Nakhia agrees to follow up with our clinic in 2 weeks.  Depression Screen Laysa had a negative depression screening. Depression is commonly associated with obesity and often results in emotional eating behaviors. We will monitor this closely and work on CBT to help improve the non-hunger  eating patterns. Referral to Psychology may be required if no improvement is seen as she continues in our clinic.  Obesity Wynona is currently in the action stage of change and her goal is to continue with weight loss efforts She has agreed to follow the Category 3 plan Necole has been instructed to work up to a goal of 150 minutes of combined cardio and strengthening exercise per week for weight loss and overall health benefits. We discussed the following Behavioral Modification Strategies today: increasing lean protein intake, increasing vegetables and work on meal planning and easy cooking plans, keeping healthy foods in the home, and planning for success  Emrey has agreed to follow up with our clinic in 2 weeks. She was informed of the importance of frequent follow up visits to maximize her success with intensive lifestyle modifications for her multiple health conditions. She was informed we would discuss her lab results at her next visit unless there is a critical issue that needs to be addressed sooner. Felishia agreed to keep her next visit at the agreed upon time to discuss these results.  ALLERGIES: Allergies  Allergen Reactions   Zofran [Ondansetron Hcl] Other (See Comments)    Pt has hx of migraines; medication increases headaches  and nausea    Amoxicillin Nausea And Vomiting and Other (See Comments)    Has patient had a PCN reaction causing immediate rash, facial/tongue/throat swelling, SOB or lightheadedness with hypotension: Unknown Has patient had a PCN reaction causing severe rash involving mucus membranes or skin necrosis: No Has patient had a PCN reaction that required hospitalization: No Has patient had a PCN reaction occurring within the last 10 years: No If all of the above answers are "NO", then may proceed with Cephalosporin use.    Codeine Nausea And Vomiting   Imitrex [Sumatriptan] Nausea And Vomiting   Ultram [Tramadol Hcl] Nausea And Vomiting     MEDICATIONS: Current Outpatient Medications on File Prior to Visit  Medication Sig Dispense Refill   acetaminophen (TYLENOL) 500 MG tablet Take 1,000 mg by mouth every 6 (six) hours as needed for mild pain or moderate pain.     ALPRAZolam (XANAX) 0.25 MG tablet Take 1 tablet (0.25 mg total) by mouth 2 (two) times daily as needed for anxiety or sleep. 30 tablet 1   cetirizine (ZYRTEC) 10 MG tablet Take 1 tablet (10 mg total) by mouth daily. (Patient taking differently: Take 10 mg by mouth daily as needed for allergies. ) 30 tablet 0   cholecalciferol (VITAMIN D3) 25 MCG (1000 UT) tablet Take 5,000 Units by mouth daily.     ELDERBERRY PO Take by mouth daily.     fluticasone (FLONASE) 50 MCG/ACT nasal spray Place 2 sprays into both nostrils daily. 16 g 6   meclizine (ANTIVERT) 25 MG tablet Take 1 tablet (25 mg total) by mouth 3 (three) times daily as needed for dizziness. 30 tablet 0   Multiple Vitamins-Minerals (MULTIVITAMIN WITH MINERALS) tablet Take 1 tablet by mouth daily.     promethazine (PHENERGAN) 25 MG tablet Take 1 tablet (25 mg total) by mouth every 6 (six) hours as needed for nausea or vomiting. 30 tablet 0   SYNTHROID 175 MCG tablet TAKE 1 TABLET BY MOUTH DAILY BEFORE BREAKFAST 90 tablet 3   valACYclovir (VALTREX) 1000 MG tablet 2 tablet BID for 1 day 8 tablet 0   hyoscyamine (LEVSIN SL) 0.125 MG SL tablet Place 1 tablet (0.125 mg total) under the tongue every 4 (four) hours as needed. 30 tablet 0   No current facility-administered medications on file prior to visit.     PAST MEDICAL HISTORY: Past Medical History:  Diagnosis Date   Anxiety    Back pain    Dental infection 05/28/2011   Endometriosis    Fainting episodes    Dr Luther Parody related   Graves disease    Grief reaction 11/15/2016   History of kidney stones 2003   onset with pregnacy    History of UTI    last 4-5-weeks ago   Hyperglycemia 11/15/2016   cbg 100   Hyperthyroidism     Hypothyroidism    hypo after radioactibe iodine   IBS (irritable bowel syndrome)    Migraine    Syncope    Vitamin D deficiency     PAST SURGICAL HISTORY: Past Surgical History:  Procedure Laterality Date    treatment  [place]   CYSTOSCOPY/URETEROSCOPY/HOLMIUM LASER/STENT PLACEMENT Left 08/09/2017   Procedure: CYSTOSCOPY/RETROGRADE/URETEROSCOPY/HOLMIUM LASER/STENT PLACEMENT;  Surgeon: Festus Aloe, MD;  Location: [facility];  Service: Urology;  Laterality: Left;  ONLY NEEDS 60 MIN   ENDOMETRIAL ABLATION  01/2007   EXTRACORPOREAL SHOCK WAVE LITHOTRIPSY Left 07/21/2017   Procedure: LEFT EXTRACORPOREAL SHOCK WAVE LITHOTRIPSY (ESWL);  Surgeon: Bjorn Loser, MD;  Location: [facility];  Service: Urology;  Laterality: Left;   VAGINAL HYSTERECTOMY  2010   ovaries still in place    SOCIAL HISTORY: Social History   Tobacco Use   Smoking status: Former Smoker    Packs/day: 0.25    Years: 6.00    Pack years: 1.50    Types: Cigarettes   Smokeless tobacco: Never Used   Tobacco comment: quit 2010  Substance Use Topics   Alcohol use: Yes    Frequency: Never    Comment: Ocassionally   Drug use: No    FAMILY HISTORY: Family History  Problem Relation Age of Onset   Hyperlipidemia Mother    Heart disease Mother        bicuspid mitral valve   Hyperlipidemia Maternal Grandmother    Diabetes Maternal Grandmother    Kidney disease Maternal Grandmother    Hyperlipidemia Maternal Grandfather    Heart disease Maternal Grandfather 81       MI   Prostate cancer Maternal Grandfather    Hypertension Father    Dementia Father    Seizures Father        s/p TBI   Hypertension Paternal Grandfather    Graves' disease Sister    Obesity Brother    Prostate cancer Other        maternal and paternal grandparents   Heart attack Maternal Aunt        deceased age 95   Heart disease Maternal Aunt    Colon cancer Neg Hx    Esophageal cancer Neg  Hx     ROS: Review of Systems  Constitutional: Positive for malaise/fatigue. Negative for weight loss.  Respiratory: Positive for shortness of breath (with exertion).   Cardiovascular: Negative for palpitations and orthopnea.  Gastrointestinal: Negative for nausea and vomiting.  Musculoskeletal: Positive for back pain.       Negative muscle weakness + Muscle or joint pain (hip and knee)  Endo/Heme/Allergies:       Negative hot/cold intolerance    PHYSICAL EXAM: Blood pressure (!) 141/85, pulse 84, temperature 98.4 F (36.9 C), temperature source Oral, height 5\' 3"  (1.6 m), weight 195 lb (88.5 kg), SpO2 97 %. Body mass index is 34.54 kg/m. Physical Exam Vitals signs reviewed.  Constitutional:      Appearance: Normal appearance. She is obese.  HENT:     Head: Normocephalic and atraumatic.     Nose: Nose normal.  Eyes:     General: No scleral icterus.    Extraocular Movements: Extraocular movements intact.  Neck:     Musculoskeletal: Normal range of motion and neck supple.     Comments: No thyromegaly present Cardiovascular:     Rate and Rhythm: Normal rate and regular rhythm.     Pulses: Normal pulses.     Heart sounds: Normal heart sounds.  Pulmonary:     Effort: Pulmonary effort is normal. No respiratory distress.     Breath sounds: Normal breath sounds.  Abdominal:     Palpations: Abdomen is soft.     Tenderness: There is no abdominal tenderness.     Comments: + Obesity  Musculoskeletal: Normal range of motion.     Right lower leg: No edema.     Left lower leg: No edema.  Skin:    General: Skin is warm and dry.  Neurological:     Mental Status: She is alert and oriented to person, place, and time.     Coordination: Coordination normal.  Psychiatric:        Mood and Affect: Mood normal.        Behavior: Behavior normal.     RECENT LABS AND TESTS: BMET    Component Value Date/Time   NA 138 01/15/2019 0742   NA 137 12/13/2017 1150   K 3.6 01/15/2019 0742    CL 106 01/15/2019 0742   CO2 20 (L) 01/15/2019 0742   GLUCOSE 119 (H) 01/15/2019 0742   BUN 10 01/15/2019 0742   BUN 9 12/13/2017 1150   CREATININE 0.52 01/15/2019 0742   CREATININE 0.72 10/05/2013 1007   CALCIUM 9.1 01/15/2019 0742   GFRNONAA >60 01/15/2019 0742   GFRAA >60 01/15/2019 0742   Lab Results  Component Value Date   HGBA1C 5.1 12/13/2017   No results found for: INSULIN CBC    Component Value Date/Time   WBC 7.6 01/15/2019 0742   RBC 4.83 01/15/2019 0742   HGB 14.9 01/15/2019 0742   HGB 14.0 12/13/2017 1150   HCT 46.3 (H) 01/15/2019 0742   HCT 43.9 12/13/2017 1150   PLT 381 01/15/2019 0742   PLT 437 12/13/2017 1150   MCV 95.9 01/15/2019 0742   MCV 94 12/13/2017 1150   MCH 30.8 01/15/2019 0742   MCHC 32.2 01/15/2019 0742   RDW 11.8 01/15/2019 0742   RDW 12.5 12/13/2017 1150   LYMPHSABS 1.6 01/15/2019 0742   MONOABS 0.6 01/15/2019 0742   EOSABS 0.2 01/15/2019 0742   BASOSABS 0.1 01/15/2019 0742   Iron/TIBC/Ferritin/ %Sat    Component Value Date/Time   FERRITIN 213.4 11/27/2018 1151   Lipid Panel     Component Value Date/Time   CHOL 197 12/13/2017 1150   TRIG 151 (H) 12/13/2017 1150   HDL 63 12/13/2017 1150   CHOLHDL 3.1 12/13/2017 1150   CHOLHDL 3 11/12/2016 0751   VLDL 24.2 11/12/2016 0751   LDLCALC 104 (H) 12/13/2017 1150   Hepatic Function Panel     Component Value Date/Time   PROT 6.8 01/15/2019 0742   PROT 6.8 12/13/2017 1150   ALBUMIN 3.8 01/15/2019 0742   ALBUMIN 4.2 12/13/2017 1150   AST 27 01/15/2019 0742   ALT 36 01/15/2019 0742   ALKPHOS 72 01/15/2019 0742   BILITOT 0.6 01/15/2019 0742   BILITOT 0.5 12/13/2017 1150   BILIDIR 0.1 10/05/2013 1007   IBILI 0.3 10/05/2013 1007      Component Value Date/Time   TSH 4.28 07/21/2018 0745   Vitamin D No recent labs  ECG  shows NSR with a rate of 82 BPM INDIRECT CALORIMETER done today shows a VO2 of 266 and a REE of 1855. Her calculated basal metabolic rate is 8527 thus her  basal metabolic rate is better than expected.       OBESITY BEHAVIORAL INTERVENTION VISIT  Today's visit was # 1   Starting weight: 195 lbs Starting date: 02/15/2019 Today's weight : 195 lbs Today's date: 02/15/2019 Total lbs lost to date: 0    ASK: We discussed the diagnosis of obesity with Caffie Pinto today and Kelsye agreed to give [place] permission to discuss obesity behavioral modification therapy today.  ASSESS: Angelyn has the diagnosis of obesity and her BMI today is 34.55 Saleema is in the action stage of change   ADVISE: Bevin was educated on the multiple health risks of obesity as well as the benefit of weight loss to improve her health. She was advised of the need for long term treatment and the importance of lifestyle modifications to improve  her current health and to decrease her risk of future health problems.  AGREE: Multiple dietary modification options and treatment options were discussed and  Amalia agreed to follow the recommendations documented in the above note.  ARRANGE: Cassidy was educated on the importance of frequent visits to treat obesity as outlined per CMS and USPSTF guidelines and agreed to schedule her next follow up appointment today.   I, Trixie Dredge, am acting as transcriptionist for Ilene Qua, MD   I have reviewed the above documentation for accuracy and completeness, and I agree with the above. - Ilene Qua, MD

## 2019-02-16 ENCOUNTER — Ambulatory Visit (INDEPENDENT_AMBULATORY_CARE_PROVIDER_SITE_OTHER): Payer: No Typology Code available for payment source | Admitting: Family Medicine

## 2019-02-16 ENCOUNTER — Encounter: Payer: Self-pay | Admitting: Family Medicine

## 2019-02-16 DIAGNOSIS — F4321 Adjustment disorder with depressed mood: Secondary | ICD-10-CM | POA: Diagnosis not present

## 2019-02-16 LAB — LIPID PANEL WITH LDL/HDL RATIO
Cholesterol, Total: 230 mg/dL — ABNORMAL HIGH (ref 100–199)
HDL: 56 mg/dL (ref >39)
LDL Calculated: 140 mg/dL — ABNORMAL HIGH (ref 0–99)
LDl/HDL Ratio: 2.5 ratio (ref 0.0–3.2)
Triglycerides: 171 mg/dL — ABNORMAL HIGH (ref 0–149)
VLDL Cholesterol Cal: 34 mg/dL (ref 5–40)

## 2019-02-16 LAB — VITAMIN D 25 HYDROXY (VIT D DEFICIENCY, FRACTURES): Vit D, 25-Hydroxy: 33.4 ng/mL (ref 30.0–100.0)

## 2019-02-16 LAB — INSULIN, RANDOM: INSULIN: 18.9 u[IU]/mL (ref 2.6–24.9)

## 2019-02-16 LAB — FOLATE: Folate: 13.4 ng/mL (ref >3.0)

## 2019-02-16 LAB — TSH: TSH: 0.693 u[IU]/mL (ref 0.450–4.500)

## 2019-02-16 LAB — T4, FREE: Free T4: 1.33 ng/dL (ref 0.82–1.77)

## 2019-02-16 LAB — VITAMIN B12: Vitamin B-12: 228 pg/mL — ABNORMAL LOW (ref 232–1245)

## 2019-02-16 LAB — HEMOGLOBIN A1C
Est. average glucose Bld gHb Est-mCnc: 97 mg/dL
Hgb A1c MFr Bld: 5 % (ref 4.8–5.6)

## 2019-02-16 LAB — T3: T3, Total: 106 ng/dL (ref 71–180)

## 2019-02-16 NOTE — Progress Notes (Signed)
Virtual Visit via Video Note  I connected with ESPYN RADWAN on 02/16/19 at 12:00 PM EDT by a video enabled telemedicine application and verified that I am speaking with the correct person using two identifiers.  Location: Patient: home Provider: home   I discussed the limitations of evaluation and management by telemedicine and the availability of in person appointments. The patient expressed understanding and agreed to proceed. Magdalene Molly, CMA was able to get patient set up on on visit, phone after being unable to complete video visit.     Subjective:    Patient ID: Katie Henry, female    DOB: 10-09-1970, 48 y.o.   MRN: [id number]  No chief complaint on file.   HPI Patient is in today for evaluation of worsening grief reaction after loosing a 22 year old nephew and her step son has also had a crisis this week. She is labile and cries easily, has troubling concentrating and completing task. No suicidal ideation but does note anhedonia and has family obligations this week as well. Denies CP/palp/SOB/HA/congestion/fevers/GI or GU c/o. Taking meds as prescribed  Past Medical History:  Diagnosis Date  . Anxiety   . Back pain   . Dental infection 05/28/2011  . Endometriosis   . Fainting episodes    Dr Luther Parody related  . Graves disease   . Grief reaction 11/15/2016  . History of kidney stones 2003   onset with pregnacy   . History of UTI    last 4-5-weeks ago  . Hyperglycemia 11/15/2016   cbg 100  . Hyperthyroidism   . 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 IBS (irritable bowel syndrome)   . Migraine   . Syncope   . Vitamin D deficiency     Past Surgical History:  Procedure Laterality Date  .  treatment  1998  . APPENDECTOMY  1991  . CYSTOSCOPY/URETEROSCOPY/HOLMIUM LASER/STENT PLACEMENT Left 08/09/2017   Procedure: CYSTOSCOPY/RETROGRADE/URETEROSCOPY/HOLMIUM LASER/STENT PLACEMENT;  Surgeon: Festus Aloe, MD;  Location: [facility];  Service:  Urology;  Laterality: Left;  ONLY NEEDS 60 MIN  . ENDOMETRIAL ABLATION  01/2007  . EXTRACORPOREAL SHOCK WAVE LITHOTRIPSY Left 07/21/2017   Procedure: LEFT EXTRACORPOREAL SHOCK WAVE LITHOTRIPSY (ESWL);  Surgeon: Bjorn Loser, MD;  Location: [facility];  Service: Urology;  Laterality: Left;  Marland Kitchen VAGINAL HYSTERECTOMY  2010   ovaries still in place    Family History  Problem Relation Age of Onset  . Hyperlipidemia Mother   . Heart disease Mother        bicuspid mitral valve  . Hyperlipidemia Maternal Grandmother   . Diabetes Maternal Grandmother   . Kidney disease Maternal Grandmother   . Hyperlipidemia Maternal Grandfather   . Heart disease Maternal Grandfather 36       MI  . Prostate cancer Maternal Grandfather   . Hypertension Father   . Dementia Father   . Seizures Father        s/p TBI  . Hypertension Paternal Grandfather   . Graves' disease Sister   . Obesity Brother   . Prostate cancer Other        maternal and paternal grandparents  . Heart attack Maternal Aunt        deceased age 101  . Heart disease Maternal Aunt   . Colon cancer Neg Hx   . Esophageal cancer Neg Hx     Social History   Socioeconomic History  . Marital status: Married    Spouse name: Tirza Senteno  . Number  of children: 2  . Years of education: 49  . Highest education level: Not on file  Occupational History    Employer: [employer]  . Financial resource strain: Not on file  . Food insecurity    Worry: Not on file    Inability: Not on file  . Transportation needs    Medical: Not on file    Non-medical: Not on file  Tobacco Use  . Smoking status: Former Smoker    Packs/day: 0.25    Years: 6.00    Pack years: 1.50    Types: Cigarettes  . Smokeless tobacco: Never Used  . Tobacco comment: quit 2010  Substance and Sexual Activity  . Alcohol use: Yes    Frequency: Never    Comment: Ocassionally  . Drug use: No  . Sexual activity: Yes    Partners: Male    Comment: work at  [employer], lives with fiance and son  Lifestyle  . Physical activity    Days per week: Not on file    Minutes per session: Not on file  . Stress: Not on file  Relationships  . Social Herbalist on phone: Not on file    Gets together: Not on file    Attends religious service: Not on file    Active member of club or organization: Not on file    Attends meetings of clubs or organizations: Not on file    Relationship status: Not on file  . Intimate partner violence    Fear of current or ex partner: Not on file    Emotionally abused: Not on file    Physically abused: Not on file    Forced sexual activity: Not on file  Other Topics Concern  . Not on file  Social History Narrative   Engaged, two sons.   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   No Tob, occ alcohol, no drugs.    Outpatient Medications Prior to Visit  Medication Sig Dispense Refill  . acetaminophen (TYLENOL) 500 MG tablet Take 1,000 mg by mouth every 6 (six) hours as needed for mild pain or moderate pain.    Marland Kitchen ALPRAZolam (XANAX) 0.25 MG tablet Take 1 tablet (0.25 mg total) by mouth 2 (two) times daily as needed for anxiety or sleep. 30 tablet 1  . cetirizine (ZYRTEC) 10 MG tablet Take 1 tablet (10 mg total) by mouth daily. (Patient taking differently: Take 10 mg by mouth daily as needed for allergies. ) 30 tablet 0  . cholecalciferol (VITAMIN D3) 25 MCG (1000 UT) tablet Take 5,000 Units by mouth daily.    Marland Kitchen ELDERBERRY PO Take by mouth daily.    . fluticasone (FLONASE) 50 MCG/ACT nasal spray Place 2 sprays into both nostrils daily. 16 g 6  . hyoscyamine (LEVSIN SL) 0.125 MG SL tablet Place 1 tablet (0.125 mg total) under the tongue every 4 (four) hours as needed. 30 tablet 0  . meclizine (ANTIVERT) 25 MG tablet Take 1 tablet (25 mg total) by mouth 3 (three) times daily as needed for dizziness. 30 tablet 0  . Multiple Vitamins-Minerals (MULTIVITAMIN WITH MINERALS) tablet Take  1 tablet by mouth daily.    . promethazine (PHENERGAN) 25 MG tablet Take 1 tablet (25 mg total) by mouth every 6 (six) hours as needed for nausea or vomiting. 30 tablet 0  . SYNTHROID 175 MCG tablet TAKE 1 TABLET BY MOUTH DAILY BEFORE BREAKFAST 90 tablet  3  . valACYclovir (VALTREX) 1000 MG tablet 2 tablet BID for 1 day 8 tablet 0   No facility-administered medications prior to visit.     Allergies  Allergen Reactions  . Zofran [Ondansetron Hcl] Other (See Comments)    Pt has hx of migraines; medication increases headaches and nausea   . Amoxicillin Nausea And Vomiting and Other (See Comments)    Has patient had a PCN reaction causing immediate rash, facial/tongue/throat swelling, SOB or lightheadedness with hypotension: Unknown Has patient had a PCN reaction causing severe rash involving mucus membranes or skin necrosis: No Has patient had a PCN reaction that required hospitalization: No Has patient had a PCN reaction occurring within the last 10 years: No If all of the above answers are "NO", then may proceed with Cephalosporin use.   . Codeine Nausea And Vomiting  . Imitrex [Sumatriptan] Nausea And Vomiting  . Ultram [Tramadol Hcl] Nausea And Vomiting    Review of Systems  Constitutional: Positive for malaise/fatigue. Negative for fever.  HENT: Negative for congestion.   Eyes: Negative for blurred vision.  Respiratory: Negative for shortness of breath.   Cardiovascular: Negative for chest pain, palpitations and leg swelling.  Gastrointestinal: Negative for abdominal pain, blood in stool and nausea.  Genitourinary: Negative for dysuria and frequency.  Musculoskeletal: Negative for falls.  Skin: Negative for rash.  Neurological: Negative for dizziness, loss of consciousness and headaches.  Endo/Heme/Allergies: Negative for environmental allergies.  Psychiatric/Behavioral: Negative for depression and suicidal ideas. The patient is nervous/anxious.        Objective:     Physical Exam unable to obtain via phone  There were no vitals taken for this visit. Wt Readings from Last 3 Encounters:  02/15/19 195 lb (88.5 kg)  01/18/19 190 lb (86.2 kg)  01/15/19 193 lb (87.5 kg)    Diabetic Foot Exam - Simple   No data filed     Lab Results  Component Value Date   WBC 7.6 01/15/2019   HGB 14.9 01/15/2019   HCT 46.3 (H) 01/15/2019   PLT 381 01/15/2019   GLUCOSE 119 (H) 01/15/2019   CHOL 230 (H) 02/15/2019   TRIG 171 (H) 02/15/2019   HDL 56 02/15/2019   LDLCALC 140 (H) 02/15/2019   ALT 36 01/15/2019   AST 27 01/15/2019   NA 138 01/15/2019   K 3.6 01/15/2019   CL 106 01/15/2019   CREATININE 0.52 01/15/2019   BUN 10 01/15/2019   CO2 20 (L) 01/15/2019   TSH 0.693 02/15/2019   HGBA1C 5.0 02/15/2019    Lab Results  Component Value Date   TSH 0.693 02/15/2019   Lab Results  Component Value Date   WBC 7.6 01/15/2019   HGB 14.9 01/15/2019   HCT 46.3 (H) 01/15/2019   MCV 95.9 01/15/2019   PLT 381 01/15/2019   Lab Results  Component Value Date   NA 138 01/15/2019   K 3.6 01/15/2019   CO2 20 (L) 01/15/2019   GLUCOSE 119 (H) 01/15/2019   BUN 10 01/15/2019   CREATININE 0.52 01/15/2019   BILITOT 0.6 01/15/2019   ALKPHOS 72 01/15/2019   AST 27 01/15/2019   ALT 36 01/15/2019   PROT 6.8 01/15/2019   ALBUMIN 3.8 01/15/2019   CALCIUM 9.1 01/15/2019   ANIONGAP 12 01/15/2019   GFR 119.54 06/29/2017   Lab Results  Component Value Date   CHOL 230 (H) 02/15/2019   Lab Results  Component Value Date   HDL 56 02/15/2019   Lab Results  Component Value Date   LDLCALC 140 (H) 02/15/2019   Lab Results  Component Value Date   TRIG 171 (H) 02/15/2019   Lab Results  Component Value Date   CHOLHDL 3.1 12/13/2017   Lab Results  Component Value Date   HGBA1C 5.0 02/15/2019       Assessment & Plan:   Problem List Items Addressed This Visit    Grief reaction    Has suffered 2 family tragedies this week. Has lost one family member. She  has already been struggling through a difficult time and she notes difficulty concentrating, lability and crying easily this week. We will allow her to stay out of work through next Friday and then return to work the following Monday after working through family issues and funerals. Counseled for 15 minutes, no change in meds for now         I am having Jaretssi M. Tapanes maintain her fluticasone, cetirizine, acetaminophen, valACYclovir, Synthroid, ALPRAZolam, hyoscyamine, meclizine, promethazine, cholecalciferol, ELDERBERRY PO, and multivitamin with minerals.  No orders of the defined types were placed in this encounter.     I discussed the assessment and treatment plan with the patient. The patient was provided an opportunity to ask questions and all were answered. The patient agreed with the plan and demonstrated an understanding of the instructions.   The patient was advised to call back or seek an in-person evaluation if the symptoms worsen or if the condition fails to improve as anticipated.  I provided 15 minutes of non-face-to-face time during this encounter.   Penni Homans, MD

## 2019-02-16 NOTE — Telephone Encounter (Signed)
Spoke with patient and set appt up with PCP at 12 today

## 2019-02-16 NOTE — Assessment & Plan Note (Addendum)
Has suffered 2 family tragedies this week. Has lost one family member. She has already been struggling through a difficult time and she notes difficulty concentrating, lability and crying easily this week. We will allow her to stay out of work through next Friday and then return to work the following Monday after working through family issues and funerals. Counseled for 15 minutes, no change in meds for now

## 2019-02-21 MED FILL — SYNTHROID 175 MCG TABLET: 175 | 30 days supply | Qty: 30 | Fill #6

## 2019-02-21 NOTE — Progress Notes (Signed)
Office: [phone]  /  Fax: [phone]    Date: February 22, 2019   Appointment Start Time: 9:10am Duration: 30 minutes Provider: Glennie Isle, Psy.D. Type of Session: Intake for Individual Therapy  Location of Patient: Home Location of Provider: [facility] Type of Contact: Telepsychological Visit via [employer] WebEx  Informed Consent: Katie Henry called this provider's office at 2:95AO due to uncertainty about connecting for today's appointment. As such, this provider returned her call and directions were provided. The e-mail with the secure link was re-sent. As such, today's appointment was initiated 10 minutes late. Prior to proceeding with today's appointment, two pieces of identifying information were obtained from Katie Henry to verify identity. In addition, Katie Henry's physical location at the time of this appointment was obtained. Katie Henry reported she was at home and provided the address. In the event of technical difficulties, Katie Henry shared a phone number she could be reached at. Katie Henry and this provider participated in today's telepsychological service. Also, Katie Henry denied anyone else being present in the room or on the WebEx appointment.   The provider's role was explained to Katie Henry. The provider reviewed and discussed issues of confidentiality, privacy, and limits therein (e.g., reporting obligations). In addition to verbal informed consent, written informed consent for psychological services was obtained from Katie Henry prior to the initial intake interview. Written consent included information concerning the practice, financial arrangements, and confidentiality and patients' rights. Since the clinic is not a 24/7 crisis center, mental health emergency resources were shared, and the provider explained MyChart, e-mail, voicemail, and/or other messaging systems should be utilized only for non-emergency reasons. This provider also explained that information obtained during  appointments will be placed in Katie Henry's medical record in a confidential manner and relevant information will be shared with other providers at [facility] that she meets with for coordination of care. Katie Henry verbally acknowledged understanding of the aforementioned, and agreed to use mental health emergency resources discussed if needed. Moreover, Katie Henry agreed information may be shared with other [facility] providers as needed for coordination of care. By signing the service agreement document, Katie Henry provided written consent for coordination of care.   Prior to initiating telepsychological services, Katie Henry was provided with an informed consent document, which included the development of a safety plan (i.e., an emergency contact and emergency resources) in the event of an emergency/crisis. Katie Henry expressed understanding of the rationale of the safety plan and provided consent for this provider to reach out to her emergency contact in the event of an emergency/crisis. Katie Henry returned the completed consent form prior to today's appointment. This provider verbally reviewed the consent form during today's appointment prior to proceeding with the appointment. Katie Henry verbally acknowledged understanding that she is ultimately responsible for understanding her insurance benefits as it relates to reimbursement of telepsychological and in-person services. This provider also reviewed confidentiality, as it relates to telepsychological services, as well as the rationale for telepsychological services. More specifically, this provider's clinic is limiting in-person visits due to COVID-19. Therapeutic services will resume to in-person appointments once deemed appropriate. Katie Henry expressed understanding regarding the rationale for telepsychological services. In addition, this provider explained the telepsychological services informed consent document would be considered an addendum to the initial  consent document/service agreement. Katie Henry verbally consented to proceed.   Chief Complaint/HPI: Katie Henry was referred by Dr. Ilene Henry. During the initial appointment with Dr. Ilene Henry at [facility] on February 15, 2019, Katie Henry reported experiencing the following: significant food  cravings issues  and struggling with emotional eating.   During today's appointment, Katie Henry was verbally administered a questionnaire assessing various behaviors related to emotional eating. Katie Henry endorsed the following: overeat when you are celebrating, experience food cravings on a regular basis, eat certain foods when you are anxious, stressed, depressed, or your feelings are hurt, use food to help you cope with emotional situations, find food is comforting to you, overeat when you are worried about something, overeat frequently when you are bored or lonely, not worry about what you eat when you are in a good mood and overeat when you are alone, but eat much less when you are with other people. She shared she craves peanut butter, popcorn, salsa, and chocolate. Katie Henry reported the onset of emotional eating was likely in "the middle of June." She explained her family "started going through a lot of crisis." She shared since then she has reduced emotional eating and added, "I've gotten a grip on it." Regarding family crisis, she shared she has a family member in the hospital long-term. Currently, Katie Henry described the frequency of emotional eating as "once a week." In addition, she denied a history of binge eating. Katie Henry denied a history of restricting food intake, purging and engagement in other compensatory strategies, and has never been diagnosed with an eating disorder. She also denied a history of treatment for emotional eating. Moreover, Katie Henry indicated stress triggers emotional eating, whereas focusing on her spirituality makes emotional eating better. Furthermore, Katie Henry denied other problems of  concern.    Mental Status Examination:  Appearance: neat Behavior: cooperative Mood: euthymic Affect: mood congruent Speech: normal in rate, volume, and tone Eye Contact: appropriate Psychomotor Activity: appropriate Thought Process: linear, logical, and goal directed  Content/Perceptual Disturbances: denies suicidal and homicidal ideation, plan, and intent and no hallucinations, delusions, bizarre thinking or behavior reported or observed Orientation: time, person, place and purpose of appointment Cognition/Sensorium: memory, attention, language, and fund of knowledge intact  Insight: good Judgment: good  Family & Psychosocial History: Pearlene reported she is married and has two biological children (ages 18 and 64), one step son (age 76), and one step daughter (age 90). 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 Currently, Edyn's social support system consists of her husband, son, sisters, church members, and two cousins. Moreover, Maurine stated she resides with her husband and youngest son.   Medical History:  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   IBS (irritable bowel syndrome)    Migraine    Syncope    Vitamin D deficiency    Past Surgical History:  Procedure Laterality Date    treatment  [place]   CYSTOSCOPY/URETEROSCOPY/HOLMIUM LASER/STENT PLACEMENT Left 08/09/2017   Procedure: CYSTOSCOPY/RETROGRADE/URETEROSCOPY/HOLMIUM LASER/STENT  PLACEMENT;  Surgeon: Festus Aloe, MD;  Location: [facility];  Service: Urology;  Laterality: Left;  ONLY NEEDS 60 MIN   ENDOMETRIAL ABLATION  01/2007  EXTRACORPOREAL SHOCK WAVE LITHOTRIPSY Left 07/21/2017   Procedure: LEFT EXTRACORPOREAL SHOCK WAVE LITHOTRIPSY (ESWL);  Surgeon: Bjorn Loser, MD;  Location: [facility];  Service: Urology;  Laterality: Left;   VAGINAL HYSTERECTOMY  2010   ovaries still in place   Current Outpatient Medications on File Prior to Visit  Medication Sig Dispense Refill   acetaminophen (TYLENOL) 500 MG tablet Take 1,000 mg by mouth every 6 (six) hours as needed for mild pain or moderate pain.     ALPRAZolam (XANAX) 0.25 MG tablet Take 1 tablet (0.25 mg total) by mouth 2 (two) times daily as needed for anxiety or sleep. 30 tablet 1   cetirizine (ZYRTEC) 10 MG tablet Take 1 tablet (10 mg total) by mouth daily. (Patient taking differently: Take 10 mg by mouth daily as needed for allergies. ) 30 tablet 0   cholecalciferol (VITAMIN D3) 25 MCG (1000 UT) tablet Take 5,000 Units by mouth daily.     ELDERBERRY PO Take by mouth daily.     fluticasone (FLONASE) 50 MCG/ACT nasal spray Place 2 sprays into both nostrils daily. 16 g 6   hyoscyamine (LEVSIN SL) 0.125 MG SL tablet Place 1 tablet (0.125 mg total) under the tongue every 4 (four) hours as needed. 30 tablet 0   meclizine (ANTIVERT) 25 MG tablet Take 1 tablet (25 mg total) by mouth 3 (three) times daily as needed for dizziness. 30 tablet 0   Multiple Vitamins-Minerals (MULTIVITAMIN WITH MINERALS) tablet Take 1 tablet by mouth daily.     promethazine (PHENERGAN) 25 MG tablet Take 1 tablet (25 mg total) by mouth every 6 (six) hours as needed for nausea or vomiting. 30 tablet 0   SYNTHROID 175 MCG tablet TAKE 1 TABLET BY MOUTH DAILY BEFORE BREAKFAST 90 tablet 3   valACYclovir (VALTREX) 1000 MG tablet 2 tablet BID for 1 day 8 tablet 0   No current facility-administered medications on file prior to visit.     Britiny denied a history of head injuries and loss of consciousness.   Mental Health History: Fina reported attending EAP services through [facility] 4 years ago for grief. Laylamarie denied a history of hospitalizations for psychiatric concerns, and has never met with a psychiatrist. Thalia stated she is prescribed Xanax PRN by her PCP. Lenay denied a family history of mental health related concerns. Seven denied a trauma history, including psychological, physical  and sexual abuse, as well as neglect.   Eboni described her typical mood as "happy," but she acknowledged "having moments." Marzelle indicated she experiences crying spells and noted, "I did lose a family member last week." Nasreen denied current alcohol use. She denied tobacco use. She denied illicit/recreational substance use. She also denied caffeine intake. Furthermore, Myrtle denied experiencing the following: hopelessness, hallucinations and delusions, paranoia, mania, panic attacks and decreased motivation. She also denied history of and current suicidal ideation, plan, and intent; history of and current homicidal ideation, plan, and intent; and history of and current engagement in self-harm.  The following strengths were reported by [place]: belief in faith and communicating. The following strengths were observed by this provider: ability to express thoughts and feelings during the therapeutic session, ability to establish and benefit from a therapeutic relationship, ability to learn and practice coping skills, willingness to work toward established goal(s) with the clinic and ability to engage in reciprocal conversation.  Legal History: Monai denied a history of legal involvement.   Structured Assessment Results: The Patient Health Questionnaire-9 (PHQ-9) is a self-report measure that assesses  symptoms and severity of depression over the course of the last two weeks. Dafne obtained a score of 0. Little interest or pleasure in doing  things 0  Feeling down, depressed, or hopeless 0  Trouble falling or staying asleep, or sleeping too much 0  Feeling tired or having little energy 0  Poor appetite or overeating 0  Feeling bad about yourself --- or that you are a failure or have let yourself or your family down 0  Trouble concentrating on things, such as reading the newspaper or watching television 0  Moving or speaking so slowly that other people could have noticed? Or the opposite --- being so fidgety or restless that you have been moving around a lot more than usual 0  Thoughts that you would be better off dead or hurting yourself in some way 0  PHQ-9 Score 0    The Generalized Anxiety Disorder-7 (GAD-7) is a brief self-report measure that assesses symptoms of anxiety over the course of the last two weeks. Yarelli obtained a score of 0. Feeling nervous, anxious, on edge 0  Not being able to stop or control worrying 0  Worrying too much about different things 0  Trouble relaxing 0  Being so restless that it's hard to sit still 0  Becoming easily annoyed or irritable 0  Feeling afraid as if something awful might happen 0  GAD-7 Score 0   Interventions: A chart review was conducted prior to the clinical intake interview. The PHQ-9, and GAD-7 were verbally administered as well as a Mood and Food questionnaire to assess various behaviors related to emotional eating. Throughout session, empathic reflections and validation was provided. Staceyann declined future appointments with this provider. Nevertheless, psychoeducation regarding emotional versus physical hunger was provided to increase awareness of hunger patterns and subsequent eating. Lind provided verbal consent during today's appointment for this provider to send the handout via e-mail.   Provisional DSM-5 Diagnosis: 311 (F32.8) Other Specified Depressive Disorder, Emotional Eating Behaviors  Plan: Addysyn declined future appointments with this provider and noted, "I think  I have it under control" as she is engaging in various coping skills. She acknowledged understanding that she may request a follow-up appointment in the future with this provider as long as she is still established with the clinic. No further follow-up planned by this provider.

## 2019-02-22 ENCOUNTER — Other Ambulatory Visit: Payer: Self-pay

## 2019-02-22 ENCOUNTER — Ambulatory Visit (INDEPENDENT_AMBULATORY_CARE_PROVIDER_SITE_OTHER): Payer: No Typology Code available for payment source | Admitting: Psychology

## 2019-02-22 DIAGNOSIS — F3289 Other specified depressive episodes: Secondary | ICD-10-CM

## 2019-02-26 ENCOUNTER — Encounter: Payer: Self-pay | Admitting: Family Medicine

## 2019-02-26 ENCOUNTER — Ambulatory Visit (INDEPENDENT_AMBULATORY_CARE_PROVIDER_SITE_OTHER): Payer: No Typology Code available for payment source | Admitting: Family Medicine

## 2019-02-26 ENCOUNTER — Other Ambulatory Visit: Payer: Self-pay

## 2019-02-26 DIAGNOSIS — F4321 Adjustment disorder with depressed mood: Secondary | ICD-10-CM | POA: Diagnosis not present

## 2019-02-26 DIAGNOSIS — E038 Other specified hypothyroidism: Secondary | ICD-10-CM | POA: Diagnosis not present

## 2019-02-26 DIAGNOSIS — E785 Hyperlipidemia, unspecified: Secondary | ICD-10-CM | POA: Diagnosis not present

## 2019-02-26 DIAGNOSIS — R739 Hyperglycemia, unspecified: Secondary | ICD-10-CM

## 2019-02-26 DIAGNOSIS — E559 Vitamin D deficiency, unspecified: Secondary | ICD-10-CM

## 2019-02-26 NOTE — Patient Instructions (Signed)
Vitamin B12 Deficiency Vitamin B12 deficiency means that your body does not have enough vitamin B12. The body needs this vitamin:  To make red blood cells.  To make genes (DNA).  To help the nerves work. If you do not have enough vitamin B12 in your body, you can have health problems. What are the causes?  Not eating enough foods that contain vitamin B12.  Not being able to absorb vitamin B12 from the food that you eat.  Certain digestive system diseases.  A condition in which the body does not make enough of a certain protein, which results in too few red blood cells (pernicious anemia).  Having a surgery in which part of the stomach or small intestine is removed.  Taking medicines that make it hard for the body to absorb vitamin B12. These medicines include: ? Heartburn medicines. ? Some antibiotic medicines. ? Other medicines that are used to treat certain conditions. What increases the risk?  Being older than age 23.  Eating a vegetarian or vegan diet, especially while you are pregnant.  Eating a poor diet while you are pregnant.  Taking certain medicines.  Having alcoholism. What are the signs or symptoms? In some cases, there are no symptoms. If the condition leads to too few blood cells or nerve damage, symptoms can occur, such as:  Feeling weak.  Feeling tired (fatigued).  Not being hungry.  Weight loss.  A loss of feeling (numbness) or tingling in your hands and feet.  Redness and burning of the tongue.  Being mixed up (confused) or having memory problems.  Sadness (depression).  Problems with your senses. This can include color blindness, ringing in the ears, or loss of taste.  Watery poop (diarrhea) or trouble pooping (constipation).  Trouble walking. If anemia is very bad, symptoms can include:  Being short of breath.  Being dizzy.  Having a very fast heartbeat. How is this treated?  Changing the way you eat and drink, such as: ?  Eating more foods that contain vitamin B12. ? Drinking little or no alcohol.  Getting vitamin B12 shots.  Taking vitamin B12 supplements. Your doctor will tell you the dose that is best for you. Follow these instructions at home: Eating and drinking   Eat lots of healthy foods that contain vitamin B12. These include: ? Meats and poultry, such as beef, pork, chicken, [place], and organ meats, such as liver. ? Seafood, such as clams, rainbow trout, salmon, tuna, and haddock. ? Eggs. ? Cereal and dairy products that have vitamin B12 added to them. Check the label. The items listed above may not be a complete list of what you can eat and drink. Contact a dietitian for more options. General instructions  Get any shots as told by your doctor.  Take supplements only as told by your doctor.  Do not drink alcohol if your doctor tells you not to. In some cases, you may only be asked to limit alcohol use.  Keep all follow-up visits as told by your doctor. This is important. Contact a doctor if:  Your symptoms come back. Get help right away if:  You have trouble breathing.  You have a very fast heartbeat.  You have chest pain.  You get dizzy.  You pass out. Summary  Vitamin B12 deficiency means that your body is not getting enough vitamin B12.  In some cases, there are no symptoms of this condition.  Treatment may include making a change in the way you eat and drink,  getting vitamin B12 shots, or taking supplements.  Eat lots of healthy foods that contain vitamin B12. This information is not intended to replace advice given to you by your health care provider. Make sure you discuss any questions you have with your health care provider. Document Released: 06/24/2011 Document Revised: 03/14/2018 Document Reviewed: 03/14/2018 Elsevier Patient Education  2020 [employer].

## 2019-02-28 NOTE — Assessment & Plan Note (Signed)
hgba1c acceptable, minimize simple carbs. Increase exercise as tolerated.  

## 2019-02-28 NOTE — Progress Notes (Signed)
Subjective:    Patient ID: Katie Henry, female    DOB: 11-10-1970, 48 y.o.   MRN: [id number]  No chief complaint on file.   HPI Patient is in today for follow up after needing some time on leave. She has been under a great deal of stress secondary to demands of her and her time and some family tragedies. Denies CP/palp/SOB/HA/congestion/fevers/GI or GU c/o. Taking meds as prescribed. No recent febrile illness or hospitalizations. She hs noted an uptick in her dyspepsia.   Past Medical History:  Diagnosis Date  . Anxiety   . Back pain   . Dental infection 05/28/2011  . Endometriosis   . Fainting episodes    Dr Luther Parody related  . Graves disease   . Grief reaction 11/15/2016  . History of kidney stones 2003   onset with pregnacy   . History of UTI    last 4-5-weeks ago  . Hyperglycemia 11/15/2016   cbg 100  . Hyperthyroidism   . 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 IBS (irritable bowel syndrome)   . Migraine   . Syncope   . Vitamin D deficiency     Past Surgical History:  Procedure Laterality Date  .  treatment  1998  . APPENDECTOMY  1991  . CYSTOSCOPY/URETEROSCOPY/HOLMIUM LASER/STENT PLACEMENT Left 08/09/2017   Procedure: CYSTOSCOPY/RETROGRADE/URETEROSCOPY/HOLMIUM LASER/STENT PLACEMENT;  Surgeon: Festus Aloe, MD;  Location: [facility];  Service: Urology;  Laterality: Left;  ONLY NEEDS 60 MIN  . ENDOMETRIAL ABLATION  01/2007  . EXTRACORPOREAL SHOCK WAVE LITHOTRIPSY Left 07/21/2017   Procedure: LEFT EXTRACORPOREAL SHOCK WAVE LITHOTRIPSY (ESWL);  Surgeon: Bjorn Loser, MD;  Location: [facility];  Service: Urology;  Laterality: Left;  Marland Kitchen VAGINAL HYSTERECTOMY  2010   ovaries still in place    Family History  Problem Relation Age of Onset  . Hyperlipidemia Mother   . Heart disease Mother        bicuspid mitral valve  . Hyperlipidemia Maternal Grandmother   . Diabetes Maternal Grandmother   . Kidney disease Maternal Grandmother   . Hyperlipidemia  Maternal Grandfather   . Heart disease Maternal Grandfather 22       MI  . Prostate cancer Maternal Grandfather   . Hypertension Father   . Dementia Father   . Seizures Father        s/p TBI  . Hypertension Paternal Grandfather   . Graves' disease Sister   . Obesity Brother   . Prostate cancer Other        maternal and paternal grandparents  . Heart attack Maternal Aunt        deceased age 64  . Heart disease Maternal Aunt   . Colon cancer Neg Hx   . Esophageal cancer Neg Hx     Social History   Socioeconomic History  . Marital status: Married    Spouse name: Skylee Baird  . Number of children: 2  . Years of education: 4  . Highest education level: Not on file  Occupational History    Employer: [employer]  . Financial resource strain: Not on file  . Food insecurity    Worry: Not on file    Inability: Not on file  . Transportation needs    Medical: Not on file    Non-medical: Not on file  Tobacco Use  . Smoking status: Former Smoker    Packs/day: 0.25    Years: 6.00    Pack years: 1.50  Types: Cigarettes  . Smokeless tobacco: Never Used  . Tobacco comment: quit 2010  Substance and Sexual Activity  . Alcohol use: Yes    Frequency: Never    Comment: Ocassionally  . Drug use: No  . Sexual activity: Yes    Partners: Male    Comment: work at [employer], lives with fiance and son  Lifestyle  . Physical activity    Days per week: Not on file    Minutes per session: Not on file  . Stress: Not on file  Relationships  . Social Herbalist on phone: Not on file    Gets together: Not on file    Attends religious service: Not on file    Active member of club or organization: Not on file    Attends meetings of clubs or organizations: Not on file    Relationship status: Not on file  . Intimate partner violence    Fear of current or ex partner: Not on file    Emotionally abused: Not on file    Physically abused: Not on file    Forced  sexual activity: Not on file  Other Topics Concern  . Not on file  Social History Narrative   Engaged, two sons.   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   No Tob, occ alcohol, no drugs.    Outpatient Medications Prior to Visit  Medication Sig Dispense Refill  . acetaminophen (TYLENOL) 500 MG tablet Take 1,000 mg by mouth every 6 (six) hours as needed for mild pain or moderate pain.    Marland Kitchen ALPRAZolam (XANAX) 0.25 MG tablet Take 1 tablet (0.25 mg total) by mouth 2 (two) times daily as needed for anxiety or sleep. 30 tablet 1  . cetirizine (ZYRTEC) 10 MG tablet Take 1 tablet (10 mg total) by mouth daily. (Patient taking differently: Take 10 mg by mouth daily as needed for allergies. ) 30 tablet 0  . cholecalciferol (VITAMIN D3) 25 MCG (1000 UT) tablet Take 5,000 Units by mouth daily.    Marland Kitchen ELDERBERRY PO Take by mouth daily.    . fluticasone (FLONASE) 50 MCG/ACT nasal spray Place 2 sprays into both nostrils daily. 16 g 6  . hyoscyamine (LEVSIN SL) 0.125 MG SL tablet Place 1 tablet (0.125 mg total) under the tongue every 4 (four) hours as needed. 30 tablet 0  . meclizine (ANTIVERT) 25 MG tablet Take 1 tablet (25 mg total) by mouth 3 (three) times daily as needed for dizziness. 30 tablet 0  . Multiple Vitamins-Minerals (MULTIVITAMIN WITH MINERALS) tablet Take 1 tablet by mouth daily.    . promethazine (PHENERGAN) 25 MG tablet Take 1 tablet (25 mg total) by mouth every 6 (six) hours as needed for nausea or vomiting. 30 tablet 0  . SYNTHROID 175 MCG tablet TAKE 1 TABLET BY MOUTH DAILY BEFORE BREAKFAST 90 tablet 3  . valACYclovir (VALTREX) 1000 MG tablet 2 tablet BID for 1 day 8 tablet 0   No facility-administered medications prior to visit.     Allergies  Allergen Reactions  . Zofran [Ondansetron Hcl] Other (See Comments)    Pt has hx of migraines; medication increases headaches and nausea   . Amoxicillin Nausea And Vomiting and Other (See Comments)    Has  patient had a PCN reaction causing immediate rash, facial/tongue/throat swelling, SOB or lightheadedness with hypotension: Unknown Has patient had a PCN reaction causing severe rash involving mucus membranes or skin necrosis: No  Has patient had a PCN reaction that required hospitalization: No Has patient had a PCN reaction occurring within the last 10 years: No If all of the above answers are "NO", then may proceed with Cephalosporin use.   . Codeine Nausea And Vomiting  . Imitrex [Sumatriptan] Nausea And Vomiting  . Ultram [Tramadol Hcl] Nausea And Vomiting    Review of Systems  Constitutional: Negative for fever and malaise/fatigue.  HENT: Negative for congestion.   Eyes: Negative for blurred vision.  Respiratory: Negative for shortness of breath.   Cardiovascular: Negative for chest pain, palpitations and leg swelling.  Gastrointestinal: Negative for abdominal pain, blood in stool and nausea.  Genitourinary: Negative for dysuria and frequency.  Musculoskeletal: Negative for falls.  Skin: Negative for rash.  Neurological: Positive for headaches. Negative for dizziness and loss of consciousness.  Endo/Heme/Allergies: Negative for environmental allergies.  Psychiatric/Behavioral: Positive for depression. The patient is nervous/anxious.        Objective:    Physical Exam Vitals signs and nursing note reviewed.  Constitutional:      General: She is not in acute distress.    Appearance: She is well-developed.  HENT:     Head: Normocephalic and atraumatic.     Nose: Nose normal.  Eyes:     General:        Right eye: No discharge.        Left eye: No discharge.  Neck:     Musculoskeletal: Normal range of motion and neck supple.  Cardiovascular:     Rate and Rhythm: Normal rate and regular rhythm.     Heart sounds: No murmur.  Pulmonary:     Effort: Pulmonary effort is normal.     Breath sounds: Normal breath sounds.  Abdominal:     General: Bowel sounds are normal.      Palpations: Abdomen is soft.     Tenderness: There is no abdominal tenderness.  Skin:    General: Skin is warm and dry.  Neurological:     Mental Status: She is alert and oriented to person, place, and time.     BP (!) 142/90 (BP Location: Left Arm, Patient Position: Sitting, Cuff Size: Normal)   Pulse 99   Temp 97.9 F (36.6 C) (Oral)   Resp 18   Wt 195 lb (88.5 kg)   SpO2 98%   BMI 34.54 kg/m  Wt Readings from Last 3 Encounters:  02/26/19 195 lb (88.5 kg)  02/15/19 195 lb (88.5 kg)  01/18/19 190 lb (86.2 kg)    Diabetic Foot Exam - Simple   No data filed     Lab Results  Component Value Date   WBC 7.6 01/15/2019   HGB 14.9 01/15/2019   HCT 46.3 (H) 01/15/2019   PLT 381 01/15/2019   GLUCOSE 119 (H) 01/15/2019   CHOL 230 (H) 02/15/2019   TRIG 171 (H) 02/15/2019   HDL 56 02/15/2019   LDLCALC 140 (H) 02/15/2019   ALT 36 01/15/2019   AST 27 01/15/2019   NA 138 01/15/2019   K 3.6 01/15/2019   CL 106 01/15/2019   CREATININE 0.52 01/15/2019   BUN 10 01/15/2019   CO2 20 (L) 01/15/2019   TSH 0.693 02/15/2019   HGBA1C 5.0 02/15/2019    Lab Results  Component Value Date   TSH 0.693 02/15/2019   Lab Results  Component Value Date   WBC 7.6 01/15/2019   HGB 14.9 01/15/2019   HCT 46.3 (H) 01/15/2019   MCV 95.9 01/15/2019  PLT 381 01/15/2019   Lab Results  Component Value Date   NA 138 01/15/2019   K 3.6 01/15/2019   CO2 20 (L) 01/15/2019   GLUCOSE 119 (H) 01/15/2019   BUN 10 01/15/2019   CREATININE 0.52 01/15/2019   BILITOT 0.6 01/15/2019   ALKPHOS 72 01/15/2019   AST 27 01/15/2019   ALT 36 01/15/2019   PROT 6.8 01/15/2019   ALBUMIN 3.8 01/15/2019   CALCIUM 9.1 01/15/2019   ANIONGAP 12 01/15/2019   GFR 119.54 06/29/2017   Lab Results  Component Value Date   CHOL 230 (H) 02/15/2019   Lab Results  Component Value Date   HDL 56 02/15/2019   Lab Results  Component Value Date   LDLCALC 140 (H) 02/15/2019   Lab Results  Component Value Date    TRIG 171 (H) 02/15/2019   Lab Results  Component Value Date   CHOLHDL 3.1 12/13/2017   Lab Results  Component Value Date   HGBA1C 5.0 02/15/2019       Assessment & Plan:   Problem List Items Addressed This Visit    Vitamin D deficiency    Supplement and monitor      Hypothyroidism    On Levothyroxine, continue to monitor      Grief reaction    Has suffered from grief due to family stressors and loss. She is managing but has had to take a week off to help her get past this difficult time. She is able to return to work at this time      Hyperglycemia    hgba1c acceptable, minimize simple carbs. Increase exercise as tolerated.      Hyperlipidemia    Encouraged heart healthy diet, increase exercise, avoid trans fats, consider a krill oil cap daily         I am having Ashlyn M. Wise maintain her fluticasone, cetirizine, acetaminophen, valACYclovir, Synthroid, ALPRAZolam, hyoscyamine, meclizine, promethazine, cholecalciferol, ELDERBERRY PO, and multivitamin with minerals.  No orders of the defined types were placed in this encounter.    Penni Homans, MD

## 2019-02-28 NOTE — Assessment & Plan Note (Signed)
Encouraged heart healthy diet, increase exercise, avoid trans fats, consider a krill oil cap daily 

## 2019-02-28 NOTE — Assessment & Plan Note (Signed)
Has suffered from grief due to family stressors and loss. She is managing but has had to take a week off to help her get past this difficult time. She is able to return to work at this time

## 2019-02-28 NOTE — Assessment & Plan Note (Signed)
On Levothyroxine, continue to monitor 

## 2019-02-28 NOTE — Assessment & Plan Note (Signed)
Supplement and monitor 

## 2019-03-01 ENCOUNTER — Other Ambulatory Visit: Payer: Self-pay

## 2019-03-01 ENCOUNTER — Ambulatory Visit (INDEPENDENT_AMBULATORY_CARE_PROVIDER_SITE_OTHER): Payer: No Typology Code available for payment source | Admitting: Family Medicine

## 2019-03-01 ENCOUNTER — Encounter (INDEPENDENT_AMBULATORY_CARE_PROVIDER_SITE_OTHER): Payer: Self-pay | Admitting: Family Medicine

## 2019-03-01 VITALS — BP 143/88 | HR 83 | Temp 98.2°F | Ht 63.0 in | Wt 194.0 lb

## 2019-03-01 DIAGNOSIS — E88819 Insulin resistance, unspecified: Secondary | ICD-10-CM

## 2019-03-01 DIAGNOSIS — E538 Deficiency of other specified B group vitamins: Secondary | ICD-10-CM | POA: Diagnosis not present

## 2019-03-01 DIAGNOSIS — Z6834 Body mass index (BMI) 34.0-34.9, adult: Secondary | ICD-10-CM

## 2019-03-01 DIAGNOSIS — E66811 Obesity, class 1: Secondary | ICD-10-CM

## 2019-03-01 DIAGNOSIS — E559 Vitamin D deficiency, unspecified: Secondary | ICD-10-CM | POA: Diagnosis not present

## 2019-03-01 DIAGNOSIS — E661 Drug-induced obesity: Secondary | ICD-10-CM

## 2019-03-01 DIAGNOSIS — E8881 Metabolic syndrome: Secondary | ICD-10-CM | POA: Diagnosis not present

## 2019-03-01 DIAGNOSIS — Z9189 Other specified personal risk factors, not elsewhere classified: Secondary | ICD-10-CM

## 2019-03-01 MED ORDER — VITAMIN D (ERGOCALCIFEROL) 1.25 MG (50000 UNIT) PO CAPS: 50000.0000 [IU] | ORAL_CAPSULE | ORAL | 0 refills | Status: DC

## 2019-03-01 MED FILL — VIT D2 1.25 MG (50,000 UNIT: 1.25 MG | 28 days supply | Qty: 4 | Fill #0

## 2019-03-06 NOTE — Progress Notes (Signed)
Office: [phone]  /  Fax: [phone]   HPI:   Chief Complaint: OBESITY Katie Henry is here to discuss her progress with her obesity treatment plan. She is on the Category 3 plan and is following her eating plan approximately 90-95 % of the time. She states she is walking for 2 1/2 miles 7 times per week. Jenevie voiced that she enjoyed the meal plan. Quantity wise, she is getting in close to 9 oz of meat at dinner. She is doing smartfood popcorn, [employer] yogurt bars and fruit cups. She is looking to add veggies to eggs.  Her weight is 194 lb (88 kg) today and has had a weight loss of 1 pound over a period of 2 weeks since her last visit. She has lost 1 lb since starting treatment with [place].  Insulin Resistance Rayana has a diagnosis of insulin resistance based on her elevated fasting insulin level >5. Last insulin was 18.9 and Hgb A1c of 5.0. Although Richele's blood glucose readings are still under good control, insulin resistance puts her at greater risk of metabolic syndrome and diabetes. She has previously been eating more carbohydrates than now. She is not taking metformin currently and continues to work on diet and exercise to decrease risk of diabetes.  Vitamin D Deficiency Kareena has a diagnosis of vitamin D deficiency. Last Vit D level was 33.4. She is currently taking OTC Vit D. She notes fatigue and denies nausea, vomiting or muscle weakness.  At risk for osteopenia and osteoporosis Leilene is at higher risk of osteopenia and osteoporosis due to vitamin D deficiency.   Vitamin B12 Deficiency Aubreigh has a diagnosis of B12 insufficiency and notes fatigue. Her Vitamin B12 level is slight low. She is not a vegetarian and does not have a previous diagnosis of pernicious anemia. She does not have a history of gastric bypass/sleeve.   ASSESSMENT AND PLAN:  Vitamin D deficiency  Insulin resistance  Vitamin B12 deficiency  At risk for osteoporosis  Class 1 drug-induced obesity with  serious comorbidity and body mass index (BMI) of 34.0 to 34.9 in adult  PLAN:  Insulin Resistance Korynn will continue to work on weight loss, exercise, and decreasing simple carbohydrates in her diet to help decrease the risk of diabetes. We dicussed metformin including benefits and risks. She was informed that eating too many simple carbohydrates or too many calories at one sitting increases the likelihood of GI side effects. Albirda declined metformin for now and prescription was not written today. We will repeat labs in 3 months. Linet agrees to follow up with our clinic in 2 weeks as directed to monitor her progress.  Vitamin D Deficiency Aeliana was informed that low vitamin D levels contributes to fatigue and are associated with obesity, breast, and colon cancer. Kieana agrees to start prescription Vit D 50,000 IU every week #4 with no refills, and she is to stop OTC Vit D. She will follow up for routine testing of vitamin D, at least 2-3 times per year. She was informed of the risk of over-replacement of vitamin D and agrees to not increase her dose unless she discusses this with [place] first. Emaree agrees to follow up with our clinic in 2 weeks.  At risk for osteopenia and osteoporosis Kajal was given extended (30 minutes) osteoporosis prevention counseling today. Jaimee is at risk for osteopenia and osteoporsis due to her vitamin D deficiency. She was encouraged to take her vitamin D and follow her higher calcium diet and increase strengthening exercise  to help strengthen her bones and decrease her risk of osteopenia and osteoporosis.  Vitamin B12 Deficiency Heidee will work on increasing B12 rich foods in her diet. B12 supplementation was not prescribed today. We will repeat Vit B12 level in 3 months.  Obesity Kynlea is currently in the action stage of change. As such, her goal is to continue with weight loss efforts She has agreed to follow the Category 3 plan Vashti has been instructed  to work up to a goal of 150 minutes of combined cardio and strengthening exercise per week for weight loss and overall health benefits. We discussed the following Behavioral Modification Strategies today: increasing lean protein intake, increasing vegetables and work on meal planning and easy cooking plans, keeping healthy foods in the home, and planning for success   Fanchon has agreed to follow up with our clinic in 2 weeks. She was informed of the importance of frequent follow up visits to maximize her success with intensive lifestyle modifications for her multiple health conditions.  ALLERGIES: Allergies  Allergen Reactions   Zofran [Ondansetron Hcl] Other (See Comments)    Pt has hx of migraines; medication increases headaches and nausea    Amoxicillin Nausea And Vomiting and Other (See Comments)    Has patient had a PCN reaction causing immediate rash, facial/tongue/throat swelling, SOB or lightheadedness with hypotension: Unknown Has patient had a PCN reaction causing severe rash involving mucus membranes or skin necrosis: No Has patient had a PCN reaction that required hospitalization: No Has patient had a PCN reaction occurring within the last 10 years: No If all of the above answers are "NO", then may proceed with Cephalosporin use.    Codeine Nausea And Vomiting   Imitrex [Sumatriptan] Nausea And Vomiting   Ultram [Tramadol Hcl] Nausea And Vomiting    MEDICATIONS: Current Outpatient Medications on File Prior to Visit  Medication Sig Dispense Refill   acetaminophen (TYLENOL) 500 MG tablet Take 1,000 mg by mouth every 6 (six) hours as needed for mild pain or moderate pain.     ALPRAZolam (XANAX) 0.25 MG tablet Take 1 tablet (0.25 mg total) by mouth 2 (two) times daily as needed for anxiety or sleep. 30 tablet 1   cetirizine (ZYRTEC) 10 MG tablet Take 1 tablet (10 mg total) by mouth daily. (Patient taking differently: Take 10 mg by mouth daily as needed for allergies. ) 30  tablet 0   cholecalciferol (VITAMIN D3) 25 MCG (1000 UT) tablet Take 5,000 Units by mouth daily.     ELDERBERRY PO Take by mouth daily.     fluticasone (FLONASE) 50 MCG/ACT nasal spray Place 2 sprays into both nostrils daily. 16 g 6   hyoscyamine (LEVSIN SL) 0.125 MG SL tablet Place 1 tablet (0.125 mg total) under the tongue every 4 (four) hours as needed. 30 tablet 0   meclizine (ANTIVERT) 25 MG tablet Take 1 tablet (25 mg total) by mouth 3 (three) times daily as needed for dizziness. 30 tablet 0   Multiple Vitamins-Minerals (MULTIVITAMIN WITH MINERALS) tablet Take 1 tablet by mouth daily.     promethazine (PHENERGAN) 25 MG tablet Take 1 tablet (25 mg total) by mouth every 6 (six) hours as needed for nausea or vomiting. 30 tablet 0   SYNTHROID 175 MCG tablet TAKE 1 TABLET BY MOUTH DAILY BEFORE BREAKFAST 90 tablet 3   valACYclovir (VALTREX) 1000 MG tablet 2 tablet BID for 1 day 8 tablet 0   No current facility-administered medications on file prior to visit.  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   IBS (irritable bowel syndrome)    Migraine    Syncope    Vitamin D deficiency     PAST SURGICAL HISTORY: Past Surgical History:  Procedure Laterality Date    treatment  [place]   CYSTOSCOPY/URETEROSCOPY/HOLMIUM LASER/STENT PLACEMENT Left 08/09/2017   Procedure: CYSTOSCOPY/RETROGRADE/URETEROSCOPY/HOLMIUM LASER/STENT PLACEMENT;  Surgeon: Festus Aloe, MD;  Location: [facility];  Service: Urology;  Laterality: Left;  ONLY NEEDS 60 MIN   ENDOMETRIAL  ABLATION  01/2007   EXTRACORPOREAL SHOCK WAVE LITHOTRIPSY Left 07/21/2017   Procedure: LEFT EXTRACORPOREAL SHOCK WAVE LITHOTRIPSY (ESWL);  Surgeon: Bjorn Loser, MD;  Location: [facility];  Service: Urology;  Laterality: Left;   VAGINAL HYSTERECTOMY  2010   ovaries still in place    SOCIAL HISTORY: Social History   Tobacco Use   Smoking status: Former Smoker    Packs/day: 0.25    Years: 6.00    Pack years: 1.50    Types: Cigarettes   Smokeless tobacco: Never Used   Tobacco comment: quit 2010  Substance Use Topics   Alcohol use: Yes    Frequency: Never    Comment: Ocassionally   Drug use: No    FAMILY HISTORY: Family History  Problem Relation Age of Onset   Hyperlipidemia Mother    Heart disease Mother        bicuspid mitral valve   Hyperlipidemia Maternal Grandmother    Diabetes Maternal Grandmother    Kidney disease Maternal Grandmother    Hyperlipidemia Maternal Grandfather    Heart disease Maternal Grandfather 74       MI   Prostate cancer Maternal Grandfather    Hypertension Father    Dementia Father    Seizures Father        s/p TBI   Hypertension Paternal Grandfather    Graves' disease Sister    Obesity Brother    Prostate cancer Other        maternal and paternal grandparents   Heart attack Maternal Aunt        deceased age 80   Heart disease Maternal Aunt    Colon cancer Neg Hx    Esophageal cancer Neg Hx     ROS: Review of Systems  Constitutional: Positive for malaise/fatigue and weight loss.  Gastrointestinal: Negative for nausea and vomiting.  Musculoskeletal:       Negative muscle weakness    PHYSICAL EXAM: Blood pressure (!) 143/88, pulse 83, temperature 98.2 F (36.8 C), temperature source Oral, height 5\' 3"  (1.6 m), weight 194 lb (88 kg), SpO2 96 %. Body mass index is 34.37 kg/m. Physical Exam Vitals signs reviewed.  Constitutional:      Appearance: Normal appearance. She is obese.  Cardiovascular:     Rate  and Rhythm: Normal rate.     Pulses: Normal pulses.  Pulmonary:     Effort: Pulmonary effort is normal.     Breath sounds: Normal breath sounds.  Musculoskeletal: Normal range  of motion.  Skin:    General: Skin is warm and dry.  Neurological:     Mental Status: She is alert and oriented to person, place, and time.  Psychiatric:        Mood and Affect: Mood normal.        Behavior: Behavior normal.     RECENT LABS AND TESTS: BMET    Component Value Date/Time   NA 138 01/15/2019 0742   NA 137 12/13/2017 1150   K 3.6 01/15/2019 0742   CL 106 01/15/2019 0742   CO2 20 (L) 01/15/2019 0742   GLUCOSE 119 (H) 01/15/2019 0742   BUN 10 01/15/2019 0742   BUN 9 12/13/2017 1150   CREATININE 0.52 01/15/2019 0742   CREATININE 0.72 10/05/2013 1007   CALCIUM 9.1 01/15/2019 0742   GFRNONAA >60 01/15/2019 0742   GFRAA >60 01/15/2019 0742   Lab Results  Component Value Date   HGBA1C 5.0 02/15/2019   HGBA1C 5.1 12/13/2017   HGBA1C 5.0 11/12/2016   Lab Results  Component Value Date   INSULIN 18.9 02/15/2019   CBC    Component Value Date/Time   WBC 7.6 01/15/2019 0742   RBC 4.83 01/15/2019 0742   HGB 14.9 01/15/2019 0742   HGB 14.0 12/13/2017 1150   HCT 46.3 (H) 01/15/2019 0742   HCT 43.9 12/13/2017 1150   PLT 381 01/15/2019 0742   PLT 437 12/13/2017 1150   MCV 95.9 01/15/2019 0742   MCV 94 12/13/2017 1150   MCH 30.8 01/15/2019 0742   MCHC 32.2 01/15/2019 0742   RDW 11.8 01/15/2019 0742   RDW 12.5 12/13/2017 1150   LYMPHSABS 1.6 01/15/2019 0742   MONOABS 0.6 01/15/2019 0742   EOSABS 0.2 01/15/2019 0742   BASOSABS 0.1 01/15/2019 0742   Iron/TIBC/Ferritin/ %Sat    Component Value Date/Time   FERRITIN 213.4 11/27/2018 1151   Lipid Panel     Component Value Date/Time   CHOL 230 (H) 02/15/2019 1152   TRIG 171 (H) 02/15/2019 1152   HDL 56 02/15/2019 1152   CHOLHDL 3.1 12/13/2017 1150   CHOLHDL 3 11/12/2016 0751   VLDL 24.2 11/12/2016 0751   LDLCALC 140 (H) 02/15/2019  1152   Hepatic Function Panel     Component Value Date/Time   PROT 6.8 01/15/2019 0742   PROT 6.8 12/13/2017 1150   ALBUMIN 3.8 01/15/2019 0742   ALBUMIN 4.2 12/13/2017 1150   AST 27 01/15/2019 0742   ALT 36 01/15/2019 0742   ALKPHOS 72 01/15/2019 0742   BILITOT 0.6 01/15/2019 0742   BILITOT 0.5 12/13/2017 1150   BILIDIR 0.1 10/05/2013 1007   IBILI 0.3 10/05/2013 1007      Component Value Date/Time   TSH 0.693 02/15/2019 1152   TSH 4.28 07/21/2018 0745   TSH 0.750 12/13/2017 1150      OBESITY BEHAVIORAL INTERVENTION VISIT  Today's visit was # 2   Starting weight: 195 lbs Starting date: 02/15/2019 Today's weight : 194 lbs Today's date: 03/01/2019 Total lbs lost to date: 1    ASK: We discussed the diagnosis of obesity with Caffie Pinto today and Anamaria agreed to give [place] permission to discuss obesity behavioral modification therapy today.  ASSESS: Leontyne has the diagnosis of obesity and her BMI today is 34.37 Lynia is in the action stage of change   ADVISE: Kennie was educated on the multiple health risks of obesity as well as the benefit of weight loss to improve her health. She was advised of the  need for long term treatment and the importance of lifestyle modifications to improve her current health and to decrease her risk of future health problems.  AGREE: Multiple dietary modification options and treatment options were discussed and  Naelani agreed to follow the recommendations documented in the above note.  ARRANGE: Aireanna was educated on the importance of frequent visits to treat obesity as outlined per CMS and USPSTF guidelines and agreed to schedule her next follow up appointment today.  I, Trixie Dredge, am acting as transcriptionist for Ilene Qua, MD  I have reviewed the above documentation for accuracy and completeness, and I agree with the above. - Ilene Qua, MD

## 2019-04-02 MED FILL — SYNTHROID 175 MCG TABLET: 175 | 30 days supply | Qty: 30 | Fill #7

## 2019-04-03 ENCOUNTER — Ambulatory Visit (INDEPENDENT_AMBULATORY_CARE_PROVIDER_SITE_OTHER): Payer: No Typology Code available for payment source | Admitting: Family Medicine

## 2019-04-10 ENCOUNTER — Ambulatory Visit (INDEPENDENT_AMBULATORY_CARE_PROVIDER_SITE_OTHER): Payer: No Typology Code available for payment source | Admitting: Family Medicine

## 2019-04-10 ENCOUNTER — Other Ambulatory Visit: Payer: Self-pay

## 2019-04-10 VITALS — BP 160/92 | HR 84 | Temp 98.2°F | Ht 63.0 in | Wt 191.0 lb

## 2019-04-10 DIAGNOSIS — I10 Essential (primary) hypertension: Secondary | ICD-10-CM | POA: Diagnosis not present

## 2019-04-10 DIAGNOSIS — E559 Vitamin D deficiency, unspecified: Secondary | ICD-10-CM | POA: Diagnosis not present

## 2019-04-10 DIAGNOSIS — E661 Drug-induced obesity: Secondary | ICD-10-CM | POA: Diagnosis not present

## 2019-04-10 DIAGNOSIS — Z6834 Body mass index (BMI) 34.0-34.9, adult: Secondary | ICD-10-CM | POA: Diagnosis not present

## 2019-04-12 NOTE — Progress Notes (Signed)
Office: [phone]  /  Fax: [phone]   HPI:   Chief Complaint: OBESITY Katie Henry is here to discuss her progress with her obesity treatment plan. She is on the Category 3 plan and is following her eating plan approximately 100 % of the time. She states she is walking 2.5-5 miles 7 times per week. [employer] voices the meal plan is relatively easy to follow. She is feeling very proud of herself for committing to the meal plan. she found an apple she likes and peaches she likes. She denies hunger.  Her weight is 191 lb (86.6 kg) today and has had a weight loss of 3 pounds over a period of 5 to 6 weeks since her last visit. She has lost 4 lbs since starting treatment with [place].  Hypertension Katie Henry is a 48 y.o. female with hypertension. Len's blood pressure is fairly elevated today at 160/92. She voices she had increase in stress recently. She denies chest pain. She is working on weight loss to help control her blood pressure with the goal of decreasing her risk of heart attack and stroke.   Vitamin D Deficiency Katie Henry has a diagnosis of vitamin D deficiency. She is currently taking prescription Vit D. She notes fatigue and denies nausea, vomiting or muscle weakness.  ASSESSMENT AND PLAN:  Essential hypertension  Vitamin D deficiency  Class 1 drug-induced obesity with serious comorbidity and body mass index (BMI) of 34.0 to 34.9 in adult  PLAN:  Hypertension We discussed sodium restriction, working on healthy weight loss, and a regular exercise program as the means to achieve improved blood pressure control. [name] agreed with this plan and agreed to follow up as directed. We will continue to monitor her blood pressure as well as her progress with the above lifestyle modifications. Mack is to check her blood pressure 2-3 times per week for 2 weeks, if blood pressure is elevated we will need to start medication. She will watch for signs of hypotension as she continues her  lifestyle modifications. [name] agrees to follow up with our clinic in 2 weeks.  Vitamin D Deficiency Chazmin was informed that low vitamin D levels contributes to fatigue and are associated with obesity, breast, and colon cancer. Dorissa agrees to continue taking prescription Vit D 50,000 IU every week, no refill needed. She will follow up for routine testing of vitamin D, at least 2-3 times per year. She was informed of the risk of over-replacement of vitamin D and agrees to not increase her dose unless she discusses this with [place] first. [name] agrees to follow up with our clinic in 2 weeks.  I spent > than 50% of the 15 minute visit on counseling as documented in the note.  Obesity Katie Henry is currently in the action stage of change. As such, her goal is to continue with weight loss efforts She has agreed to keep a food journal with 300-400 calories and 25+ grams of protein at breakfast daily and follow the Category 3 plan with lunch options Katie Henry has been instructed to work up to a goal of 150 minutes of combined cardio and strengthening exercise per week for weight loss and overall health benefits. We discussed the following Behavioral Modification Strategies today: increasing lean protein intake, increasing vegetables and work on meal planning and easy cooking plans, keeping healthy foods in the home, and planning for success   Katie Henry has agreed to follow up with our clinic in 2 weeks. She was informed of the importance of frequent  follow up visits to maximize her success with intensive lifestyle modifications for her multiple health conditions.  ALLERGIES: Allergies  Allergen Reactions  . Zofran [Ondansetron Hcl] Other (See Comments)    Pt has hx of migraines; medication increases headaches and nausea   . Amoxicillin Nausea And Vomiting and Other (See Comments)    Has patient had a PCN reaction causing immediate rash, facial/tongue/throat swelling, SOB or lightheadedness with hypotension:  Unknown Has patient had a PCN reaction causing severe rash involving mucus membranes or skin necrosis: No Has patient had a PCN reaction that required hospitalization: No Has patient had a PCN reaction occurring within the last 10 years: No If all of the above answers are "NO", then may proceed with Cephalosporin use.   . Codeine Nausea And Vomiting  . Imitrex [Sumatriptan] Nausea And Vomiting  . Ultram [Tramadol Hcl] Nausea And Vomiting    MEDICATIONS: Current Outpatient Medications on File Prior to Visit  Medication Sig Dispense Refill  . acetaminophen (TYLENOL) 500 MG tablet Take 1,000 mg by mouth every 6 (six) hours as needed for mild pain or moderate pain.    Marland Kitchen ALPRAZolam (XANAX) 0.25 MG tablet Take 1 tablet (0.25 mg total) by mouth 2 (two) times daily as needed for anxiety or sleep. 30 tablet 1  . cetirizine (ZYRTEC) 10 MG tablet Take 1 tablet (10 mg total) by mouth daily. (Patient taking differently: Take 10 mg by mouth daily as needed for allergies. ) 30 tablet 0  . cholecalciferol (VITAMIN D3) 25 MCG (1000 UT) tablet Take 5,000 Units by mouth daily.    Marland Kitchen ELDERBERRY PO Take by mouth daily.    . fluticasone (FLONASE) 50 MCG/ACT nasal spray Place 2 sprays into both nostrils daily. 16 g 6  . hyoscyamine (LEVSIN SL) 0.125 MG SL tablet Place 1 tablet (0.125 mg total) under the tongue every 4 (four) hours as needed. 30 tablet 0  . meclizine (ANTIVERT) 25 MG tablet Take 1 tablet (25 mg total) by mouth 3 (three) times daily as needed for dizziness. 30 tablet 0  . Multiple Vitamins-Minerals (MULTIVITAMIN WITH MINERALS) tablet Take 1 tablet by mouth daily.    . promethazine (PHENERGAN) 25 MG tablet Take 1 tablet (25 mg total) by mouth every 6 (six) hours as needed for nausea or vomiting. 30 tablet 0  . SYNTHROID 175 MCG tablet TAKE 1 TABLET BY MOUTH DAILY BEFORE BREAKFAST 90 tablet 3  . valACYclovir (VALTREX) 1000 MG tablet 2 tablet BID for 1 day 8 tablet 0  . Vitamin D, Ergocalciferol,  (DRISDOL) 1.25 MG (50000 UT) CAPS capsule Take 1 capsule (50,000 Units total) by mouth every 7 (seven) days. 4 capsule 0   No current facility-administered medications on file prior to visit.     PAST MEDICAL HISTORY: Past Medical History:  Diagnosis Date  . Anxiety   . Back pain   . Dental infection 05/28/2011  . Endometriosis   . Fainting episodes    Dr Luther Parody related  . Graves disease   . Grief reaction 11/15/2016  . History of kidney stones 2003   onset with pregnacy   . History of UTI    last 4-5-weeks ago  . Hyperglycemia 11/15/2016   cbg 100  . Hyperthyroidism   . 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 IBS (irritable bowel syndrome)   . Migraine   . Syncope   . Vitamin D deficiency     PAST SURGICAL HISTORY: Past Surgical History:  Procedure Laterality Date  .  treatment  1998  . APPENDECTOMY  1991  . CYSTOSCOPY/URETEROSCOPY/HOLMIUM LASER/STENT PLACEMENT Left 08/09/2017   Procedure: CYSTOSCOPY/RETROGRADE/URETEROSCOPY/HOLMIUM LASER/STENT PLACEMENT;  Surgeon: Festus Aloe, MD;  Location: [facility];  Service: Urology;  Laterality: Left;  ONLY NEEDS 60 MIN  . ENDOMETRIAL ABLATION  01/2007  . EXTRACORPOREAL SHOCK WAVE LITHOTRIPSY Left 07/21/2017   Procedure: LEFT EXTRACORPOREAL SHOCK WAVE LITHOTRIPSY (ESWL);  Surgeon: Bjorn Loser, MD;  Location: [facility];  Service: Urology;  Laterality: Left;  Marland Kitchen VAGINAL HYSTERECTOMY  2010   ovaries still in place    SOCIAL HISTORY: Social History   Tobacco Use  . Smoking status: Former Smoker    Packs/day: 0.25    Years: 6.00    Pack years: 1.50    Types: Cigarettes  . Smokeless tobacco: Never Used  . Tobacco comment: quit 2010  Substance Use Topics  . Alcohol use: Yes    Frequency: Never    Comment: Ocassionally  . Drug use: No    FAMILY HISTORY: Family History  Problem Relation Age of Onset  . Hyperlipidemia Mother   . Heart disease Mother        bicuspid mitral valve  . Hyperlipidemia  Maternal Grandmother   . Diabetes Maternal Grandmother   . Kidney disease Maternal Grandmother   . Hyperlipidemia Maternal Grandfather   . Heart disease Maternal Grandfather 9       MI  . Prostate cancer Maternal Grandfather   . Hypertension Father   . Dementia Father   . Seizures Father        s/p TBI  . Hypertension Paternal Grandfather   . Graves' disease Sister   . Obesity Brother   . Prostate cancer Other        maternal and paternal grandparents  . Heart attack Maternal Aunt        deceased age 64  . Heart disease Maternal Aunt   . Colon cancer Neg Hx   . Esophageal cancer Neg Hx     ROS: Review of Systems  Constitutional: Positive for malaise/fatigue and weight loss.  Cardiovascular: Negative for chest pain.  Gastrointestinal: Negative for nausea and vomiting.  Musculoskeletal:       Negative muscle weakness    PHYSICAL EXAM: Blood pressure (!) 160/92, pulse 84, temperature 98.2 F (36.8 C), temperature source Oral, height 5\' 3"  (1.6 m), weight 191 lb (86.6 kg), SpO2 97 %. Body mass index is 33.83 kg/m. Physical Exam Vitals signs reviewed.  Constitutional:      Appearance: Normal appearance. She is obese.  Cardiovascular:     Rate and Rhythm: Normal rate.     Pulses: Normal pulses.  Pulmonary:     Effort: Pulmonary effort is normal.     Breath sounds: Normal breath sounds.  Musculoskeletal: Normal range of motion.  Skin:    General: Skin is warm and dry.  Neurological:     Mental Status: She is alert and oriented to person, place, and time.  Psychiatric:        Mood and Affect: Mood normal.        Behavior: Behavior normal.     RECENT LABS AND TESTS: BMET    Component Value Date/Time   NA 138 01/15/2019 0742   NA 137 12/13/2017 1150   K 3.6 01/15/2019 0742   CL 106 01/15/2019 0742   CO2 20 (L) 01/15/2019 0742   GLUCOSE 119 (H) 01/15/2019 0742   BUN 10 01/15/2019 0742   BUN 9 12/13/2017 1150   CREATININE 0.52 01/15/2019  0742   CREATININE  0.72 10/05/2013 1007   CALCIUM 9.1 01/15/2019 0742   GFRNONAA >60 01/15/2019 0742   GFRAA >60 01/15/2019 0742   Lab Results  Component Value Date   HGBA1C 5.0 02/15/2019   HGBA1C 5.1 12/13/2017   HGBA1C 5.0 11/12/2016   Lab Results  Component Value Date   INSULIN 18.9 02/15/2019   CBC    Component Value Date/Time   WBC 7.6 01/15/2019 0742   RBC 4.83 01/15/2019 0742   HGB 14.9 01/15/2019 0742   HGB 14.0 12/13/2017 1150   HCT 46.3 (H) 01/15/2019 0742   HCT 43.9 12/13/2017 1150   PLT 381 01/15/2019 0742   PLT 437 12/13/2017 1150   MCV 95.9 01/15/2019 0742   MCV 94 12/13/2017 1150   MCH 30.8 01/15/2019 0742   MCHC 32.2 01/15/2019 0742   RDW 11.8 01/15/2019 0742   RDW 12.5 12/13/2017 1150   LYMPHSABS 1.6 01/15/2019 0742   MONOABS 0.6 01/15/2019 0742   EOSABS 0.2 01/15/2019 0742   BASOSABS 0.1 01/15/2019 0742   Iron/TIBC/Ferritin/ %Sat    Component Value Date/Time   FERRITIN 213.4 11/27/2018 1151   Lipid Panel     Component Value Date/Time   CHOL 230 (H) 02/15/2019 1152   TRIG 171 (H) 02/15/2019 1152   HDL 56 02/15/2019 1152   CHOLHDL 3.1 12/13/2017 1150   CHOLHDL 3 11/12/2016 0751   VLDL 24.2 11/12/2016 0751   LDLCALC 140 (H) 02/15/2019 1152   Hepatic Function Panel     Component Value Date/Time   PROT 6.8 01/15/2019 0742   PROT 6.8 12/13/2017 1150   ALBUMIN 3.8 01/15/2019 0742   ALBUMIN 4.2 12/13/2017 1150   AST 27 01/15/2019 0742   ALT 36 01/15/2019 0742   ALKPHOS 72 01/15/2019 0742   BILITOT 0.6 01/15/2019 0742   BILITOT 0.5 12/13/2017 1150   BILIDIR 0.1 10/05/2013 1007   IBILI 0.3 10/05/2013 1007      Component Value Date/Time   TSH 0.693 02/15/2019 1152   TSH 4.28 07/21/2018 0745   TSH 0.750 12/13/2017 1150      OBESITY BEHAVIORAL INTERVENTION VISIT  Today's visit was # 3   Starting weight: 195 lbs Starting date: 02/15/2019 Today's weight : 191 lbs  Today's date: 04/10/2019 Total lbs lost to date: 4    ASK: We discussed the  diagnosis of obesity with Caffie Pinto today and Acasia agreed to give [place] permission to discuss obesity behavioral modification therapy today.  ASSESS: Alexyss has the diagnosis of obesity and her BMI today is 33.84 Pleshette is in the action stage of change   ADVISE: Francies was educated on the multiple health risks of obesity as well as the benefit of weight loss to improve her health. She was advised of the need for long term treatment and the importance of lifestyle modifications to improve her current health and to decrease her risk of future health problems.  AGREE: Multiple dietary modification options and treatment options were discussed and  Mileya agreed to follow the recommendations documented in the above note.  ARRANGE: Hermoine was educated on the importance of frequent visits to treat obesity as outlined per CMS and USPSTF guidelines and agreed to schedule her next follow up appointment today.  I, Trixie Dredge, am acting as transcriptionist for Ilene Qua, MD  I have reviewed the above documentation for accuracy and completeness, and I agree with the above. - Ilene Qua, MD

## 2019-04-23 ENCOUNTER — Other Ambulatory Visit: Payer: Self-pay

## 2019-04-23 ENCOUNTER — Ambulatory Visit (INDEPENDENT_AMBULATORY_CARE_PROVIDER_SITE_OTHER): Payer: No Typology Code available for payment source | Admitting: Family Medicine

## 2019-04-23 VITALS — BP 145/92 | HR 83 | Temp 98.1°F | Ht 63.0 in | Wt 190.0 lb

## 2019-04-23 DIAGNOSIS — E669 Obesity, unspecified: Secondary | ICD-10-CM

## 2019-04-23 DIAGNOSIS — Z9189 Other specified personal risk factors, not elsewhere classified: Secondary | ICD-10-CM

## 2019-04-23 DIAGNOSIS — E7849 Other hyperlipidemia: Secondary | ICD-10-CM | POA: Diagnosis not present

## 2019-04-23 DIAGNOSIS — I10 Essential (primary) hypertension: Secondary | ICD-10-CM | POA: Diagnosis not present

## 2019-04-23 DIAGNOSIS — Z6833 Body mass index (BMI) 33.0-33.9, adult: Secondary | ICD-10-CM

## 2019-04-24 NOTE — Progress Notes (Signed)
Office: [phone]  /  Fax: [phone]   HPI:   Chief Complaint: OBESITY Katie Henry is here to discuss her progress with her obesity treatment plan. She is on the keep a food journal with 300-400 calories and 25+ grams of protein at breakfast daily and follow the Category 3 plan and is following her eating plan approximately 90-95 % of the time. She states she is walking 2.5-5 miles 3-4 times per week. [name] has done slightly less walking secondary to cooler weather, so she thinks she will get back to the [facility]. She hasn't been eating as much bread but substituting with wraps. She denies hunger or cravings that aren't satiated by peanut butter and chocolate. Her weight is 190 lb (86.2 kg) today and has had a weight loss of 1 pound over a period of 2 weeks since her last visit. She has lost 5 lbs since starting treatment with Katie Henry.  Hypertension Katie Henry is a 48 y.o. female with hypertension. Katie Henry states she is checking her blood pressure at home and it has been at the highest 120/89. She denies chest pain, chest pressure, or headaches. She is working on weight loss to help control her blood pressure with the goal of decreasing her risk of heart attack and stroke.   Hyperlipidemia Katie Henry has hyperlipidemia and has been trying to improve her cholesterol levels with intensive lifestyle modification including a low saturated fat diet, exercise and weight loss. Last LDL was of 140 and HDL of 56. She is not on statin and denies any chest pain, claudication or myalgias.  At risk for cardiovascular disease Katie Henry is at a higher than average risk for cardiovascular disease due to obesity, hypertension, and hyperlipidemia. She currently denies any chest pain.  ASSESSMENT AND PLAN:  Essential hypertension - Plan: lisinopril (ZESTRIL) 5 MG tablet  Other hyperlipidemia  At risk for heart disease  Class 1 obesity with serious comorbidity and body mass index (BMI) of 33.0 to 33.9 in adult,  unspecified obesity type  PLAN:  Hypertension We discussed sodium restriction, working on healthy weight loss, and a regular exercise program as the means to achieve improved blood pressure control. Katie Henry agreed with this plan and agreed to follow up as directed. We will continue to monitor her blood pressure as well as her progress with the above lifestyle modifications. Katie Henry agrees to start lisinopril 5 mg PO daily #30 with no refills. She will watch for signs of hypotension as she continues her lifestyle modifications. Katie Henry agrees to follow up with our clinic in 2 weeks.  Hyperlipidemia Katie Henry was informed of the American Heart Association Guidelines emphasizing intensive lifestyle modifications as the first line treatment for hyperlipidemia. We discussed many lifestyle modifications today in depth, and Katie Henry will continue to work on decreasing saturated fats such as fatty red meat, butter and many fried foods. She will also increase vegetables and lean protein in her diet and continue to work on exercise and weight loss efforts. We will repeat labs in mid November.  Cardiovascular risk counseling Katie Henry was given extended (15 minutes) coronary artery disease prevention counseling today. She is 48 y.o. female and has risk factors for heart disease including obesity, hypertension, and hyperlipidemia. We discussed intensive lifestyle modifications today with an emphasis on specific weight loss instructions and strategies. Pt was also informed of the importance of increasing exercise and decreasing saturated fats to help prevent heart disease.  Obesity Katie Henry is currently in the action stage of change. As such, her goal is  to continue with weight loss efforts She has agreed to keep a food journal with 350-500 calories and 30+ grams of protein at lunch daily and follow the Category 3 plan with lunch options Katie Henry has been instructed to work up to a goal of 150 minutes of combined cardio and  strengthening exercise per week for weight loss and overall health benefits. We discussed the following Behavioral Modification Strategies today: increasing lean protein intake, increasing vegetables and work on meal planning and easy cooking plans, no skipping meals, and planning for success   Katie Henry has agreed to follow up with our clinic in 2 weeks. She was informed of the importance of frequent follow up visits to maximize her success with intensive lifestyle modifications for her multiple health conditions.  ALLERGIES: Allergies  Allergen Reactions  . Zofran [Ondansetron Hcl] Other (See Comments)    Pt has hx of migraines; medication increases headaches and nausea   . Amoxicillin Nausea And Vomiting and Other (See Comments)    Has patient had a PCN reaction causing immediate rash, facial/tongue/throat swelling, SOB or lightheadedness with hypotension: Unknown Has patient had a PCN reaction causing severe rash involving mucus membranes or skin necrosis: No Has patient had a PCN reaction that required hospitalization: No Has patient had a PCN reaction occurring within the last 10 years: No If all of the above answers are "NO", then may proceed with Cephalosporin use.   . Codeine Nausea And Vomiting  . Imitrex [Sumatriptan] Nausea And Vomiting  . Ultram [Tramadol Hcl] Nausea And Vomiting    MEDICATIONS: Current Outpatient Medications on File Prior to Visit  Medication Sig Dispense Refill  . acetaminophen (TYLENOL) 500 MG tablet Take 1,000 mg by mouth every 6 (six) hours as needed for mild pain or moderate pain.    Marland Kitchen ALPRAZolam (XANAX) 0.25 MG tablet Take 1 tablet (0.25 mg total) by mouth 2 (two) times daily as needed for anxiety or sleep. 30 tablet 1  . cetirizine (ZYRTEC) 10 MG tablet Take 1 tablet (10 mg total) by mouth daily. (Patient taking differently: Take 10 mg by mouth daily as needed for allergies. ) 30 tablet 0  . cholecalciferol (VITAMIN D3) 25 MCG (1000 UT) tablet Take  5,000 Units by mouth daily.    Marland Kitchen ELDERBERRY PO Take by mouth daily.    . fluticasone (FLONASE) 50 MCG/ACT nasal spray Place 2 sprays into both nostrils daily. 16 g 6  . hyoscyamine (LEVSIN SL) 0.125 MG SL tablet Place 1 tablet (0.125 mg total) under the tongue every 4 (four) hours as needed. 30 tablet 0  . meclizine (ANTIVERT) 25 MG tablet Take 1 tablet (25 mg total) by mouth 3 (three) times daily as needed for dizziness. 30 tablet 0  . Multiple Vitamins-Minerals (MULTIVITAMIN WITH MINERALS) tablet Take 1 tablet by mouth daily.    . promethazine (PHENERGAN) 25 MG tablet Take 1 tablet (25 mg total) by mouth every 6 (six) hours as needed for nausea or vomiting. 30 tablet 0  . SYNTHROID 175 MCG tablet TAKE 1 TABLET BY MOUTH DAILY BEFORE BREAKFAST 90 tablet 3  . valACYclovir (VALTREX) 1000 MG tablet 2 tablet BID for 1 day 8 tablet 0  . Vitamin D, Ergocalciferol, (DRISDOL) 1.25 MG (50000 UT) CAPS capsule Take 1 capsule (50,000 Units total) by mouth every 7 (seven) days. 4 capsule 0   No current facility-administered medications on file prior to visit.     PAST MEDICAL HISTORY: Past Medical History:  Diagnosis Date  . Anxiety   .  Back pain   . Dental infection 05/28/2011  . Endometriosis   . Fainting episodes    Dr Luther Parody related  . Graves disease   . Grief reaction 11/15/2016  . History of kidney stones 2003   onset with pregnacy   . History of UTI    last 4-5-weeks ago  . Hyperglycemia 11/15/2016   cbg 100  . Hyperthyroidism   . 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 IBS (irritable bowel syndrome)   . Migraine   . Syncope   . Vitamin D deficiency     PAST SURGICAL HISTORY: Past Surgical History:  Procedure Laterality Date  .  treatment  1998  . APPENDECTOMY  1991  . CYSTOSCOPY/URETEROSCOPY/HOLMIUM LASER/STENT PLACEMENT Left 08/09/2017   Procedure: CYSTOSCOPY/RETROGRADE/URETEROSCOPY/HOLMIUM LASER/STENT PLACEMENT;  Surgeon: Festus Aloe, MD;  Location: [facility]  [facility];  Service: Urology;  Laterality: Left;  ONLY NEEDS 60 MIN  . ENDOMETRIAL ABLATION  01/2007  . EXTRACORPOREAL SHOCK WAVE LITHOTRIPSY Left 07/21/2017   Procedure: LEFT EXTRACORPOREAL SHOCK WAVE LITHOTRIPSY (ESWL);  Surgeon: Bjorn Loser, MD;  Location: [facility];  Service: Urology;  Laterality: Left;  Marland Kitchen VAGINAL HYSTERECTOMY  2010   ovaries still in place    SOCIAL HISTORY: Social History   Tobacco Use  . Smoking status: Former Smoker    Packs/day: 0.25    Years: 6.00    Pack years: 1.50    Types: Cigarettes  . Smokeless tobacco: Never Used  . Tobacco comment: quit 2010  Substance Use Topics  . Alcohol use: Yes    Frequency: Never    Comment: Ocassionally  . Drug use: No    FAMILY HISTORY: Family History  Problem Relation Age of Onset  . Hyperlipidemia Mother   . Heart disease Mother        bicuspid mitral valve  . Hyperlipidemia Maternal Grandmother   . Diabetes Maternal Grandmother   . Kidney disease Maternal Grandmother   . Hyperlipidemia Maternal Grandfather   . Heart disease Maternal Grandfather 40       MI  . Prostate cancer Maternal Grandfather   . Hypertension Father   . Dementia Father   . Seizures Father        s/p TBI  . Hypertension Paternal Grandfather   . Graves' disease Sister   . Obesity Brother   . Prostate cancer Other        maternal and paternal grandparents  . Heart attack Maternal Aunt        deceased age 31  . Heart disease Maternal Aunt   . Colon cancer Neg Hx   . Esophageal cancer Neg Hx     ROS: Review of Systems  Constitutional: Positive for weight loss.  Cardiovascular: Negative for chest pain and claudication.       Negative chest pressure  Musculoskeletal: Negative for myalgias.  Neurological: Negative for headaches.    PHYSICAL EXAM: Blood pressure (!) 145/92, pulse 83, temperature 98.1 F (36.7 C), temperature source Oral, height 5\' 3"  (1.6 m), weight 190 lb (86.2 kg), SpO2 95 %. Body mass index is 33.66 kg/m.  Physical Exam Vitals signs reviewed.  Constitutional:      Appearance: Normal appearance. She is obese.  Cardiovascular:     Rate and Rhythm: Normal rate.     Pulses: Normal pulses.  Pulmonary:     Effort: Pulmonary effort is normal.     Breath sounds: Normal breath sounds.  Musculoskeletal: Normal range of motion.  Skin:    General:  Skin is warm and dry.  Neurological:     Mental Status: She is alert and oriented to person, place, and time.  Psychiatric:        Mood and Affect: Mood normal.        Behavior: Behavior normal.     RECENT LABS AND TESTS: BMET    Component Value Date/Time   NA 138 01/15/2019 0742   NA 137 12/13/2017 1150   K 3.6 01/15/2019 0742   CL 106 01/15/2019 0742   CO2 20 (L) 01/15/2019 0742   GLUCOSE 119 (H) 01/15/2019 0742   BUN 10 01/15/2019 0742   BUN 9 12/13/2017 1150   CREATININE 0.52 01/15/2019 0742   CREATININE 0.72 10/05/2013 1007   CALCIUM 9.1 01/15/2019 0742   GFRNONAA >60 01/15/2019 0742   GFRAA >60 01/15/2019 0742   Lab Results  Component Value Date   HGBA1C 5.0 02/15/2019   HGBA1C 5.1 12/13/2017   HGBA1C 5.0 11/12/2016   Lab Results  Component Value Date   INSULIN 18.9 02/15/2019   CBC    Component Value Date/Time   WBC 7.6 01/15/2019 0742   RBC 4.83 01/15/2019 0742   HGB 14.9 01/15/2019 0742   HGB 14.0 12/13/2017 1150   HCT 46.3 (H) 01/15/2019 0742   HCT 43.9 12/13/2017 1150   PLT 381 01/15/2019 0742   PLT 437 12/13/2017 1150   MCV 95.9 01/15/2019 0742   MCV 94 12/13/2017 1150   MCH 30.8 01/15/2019 0742   MCHC 32.2 01/15/2019 0742   RDW 11.8 01/15/2019 0742   RDW 12.5 12/13/2017 1150   LYMPHSABS 1.6 01/15/2019 0742   MONOABS 0.6 01/15/2019 0742   EOSABS 0.2 01/15/2019 0742   BASOSABS 0.1 01/15/2019 0742   Iron/TIBC/Ferritin/ %Sat    Component Value Date/Time   FERRITIN 213.4 11/27/2018 1151   Lipid Panel     Component Value Date/Time   CHOL 230 (H) 02/15/2019 1152   TRIG 171 (H) 02/15/2019 1152   HDL  56 02/15/2019 1152   CHOLHDL 3.1 12/13/2017 1150   CHOLHDL 3 11/12/2016 0751   VLDL 24.2 11/12/2016 0751   LDLCALC 140 (H) 02/15/2019 1152   Hepatic Function Panel     Component Value Date/Time   PROT 6.8 01/15/2019 0742   PROT 6.8 12/13/2017 1150   ALBUMIN 3.8 01/15/2019 0742   ALBUMIN 4.2 12/13/2017 1150   AST 27 01/15/2019 0742   ALT 36 01/15/2019 0742   ALKPHOS 72 01/15/2019 0742   BILITOT 0.6 01/15/2019 0742   BILITOT 0.5 12/13/2017 1150   BILIDIR 0.1 10/05/2013 1007   IBILI 0.3 10/05/2013 1007      Component Value Date/Time   TSH 0.693 02/15/2019 1152   TSH 4.28 07/21/2018 0745   TSH 0.750 12/13/2017 1150      OBESITY BEHAVIORAL INTERVENTION VISIT  Today's visit was # 4   Starting weight: 195 lbs Starting date: 02/15/2019 Today's weight : 190 lbs Today's date: 04/23/2019 Total lbs lost to date: 5    ASK: We discussed the diagnosis of obesity with Caffie Pinto today and Karlie agreed to give Katie Henry permission to discuss obesity behavioral modification therapy today.  ASSESS: Lucynda has the diagnosis of obesity and her BMI today is 33.67 Malie is in the action stage of change   ADVISE: Aashka was educated on the multiple health risks of obesity as well as the benefit of weight loss to improve her health. She was advised of the need for long term treatment and the importance  of lifestyle modifications to improve her current health and to decrease her risk of future health problems.  AGREE: Multiple dietary modification options and treatment options were discussed and  Katie Henry agreed to follow the recommendations documented in the above note.  ARRANGE: Addysin was educated on the importance of frequent visits to treat obesity as outlined per CMS and USPSTF guidelines and agreed to schedule her next follow up appointment today.  I, Trixie Dredge, am acting as transcriptionist for Ilene Qua, MD  I have reviewed the above documentation for accuracy and  completeness, and I agree with the above. - Ilene Qua, MD

## 2019-04-25 MED ORDER — LISINOPRIL 5 MG PO TABS: 5.0000 mg | ORAL_TABLET | Freq: Every day | ORAL | 0 refills | Status: DC

## 2019-04-25 MED FILL — LISINOPRIL 5 MG TABLET: 5 | 30 days supply | Qty: 30 | Fill #0

## 2019-05-09 ENCOUNTER — Other Ambulatory Visit: Payer: Self-pay

## 2019-05-09 ENCOUNTER — Encounter (INDEPENDENT_AMBULATORY_CARE_PROVIDER_SITE_OTHER): Payer: Self-pay | Admitting: Family Medicine

## 2019-05-09 ENCOUNTER — Ambulatory Visit (INDEPENDENT_AMBULATORY_CARE_PROVIDER_SITE_OTHER): Payer: No Typology Code available for payment source | Admitting: Family Medicine

## 2019-05-09 VITALS — BP 144/83 | HR 78 | Temp 98.4°F | Ht 63.0 in | Wt 189.0 lb

## 2019-05-09 DIAGNOSIS — Z6833 Body mass index (BMI) 33.0-33.9, adult: Secondary | ICD-10-CM | POA: Diagnosis not present

## 2019-05-09 DIAGNOSIS — E669 Obesity, unspecified: Secondary | ICD-10-CM

## 2019-05-09 DIAGNOSIS — I10 Essential (primary) hypertension: Secondary | ICD-10-CM

## 2019-05-09 MED FILL — SYNTHROID 175 MCG TABLET: 175 | 30 days supply | Qty: 30 | Fill #8

## 2019-05-10 IMAGING — MG DIGITAL SCREENING BILATERAL MAMMOGRAM WITH TOMO AND CAD
8 series · 8 of 24 positions shown · non-contrast
Comparison: Previous exam(s).

CLINICAL DATA: Screening.

EXAM:
DIGITAL SCREENING BILATERAL MAMMOGRAM WITH TOMO AND CAD

[R MLO synth-2D]
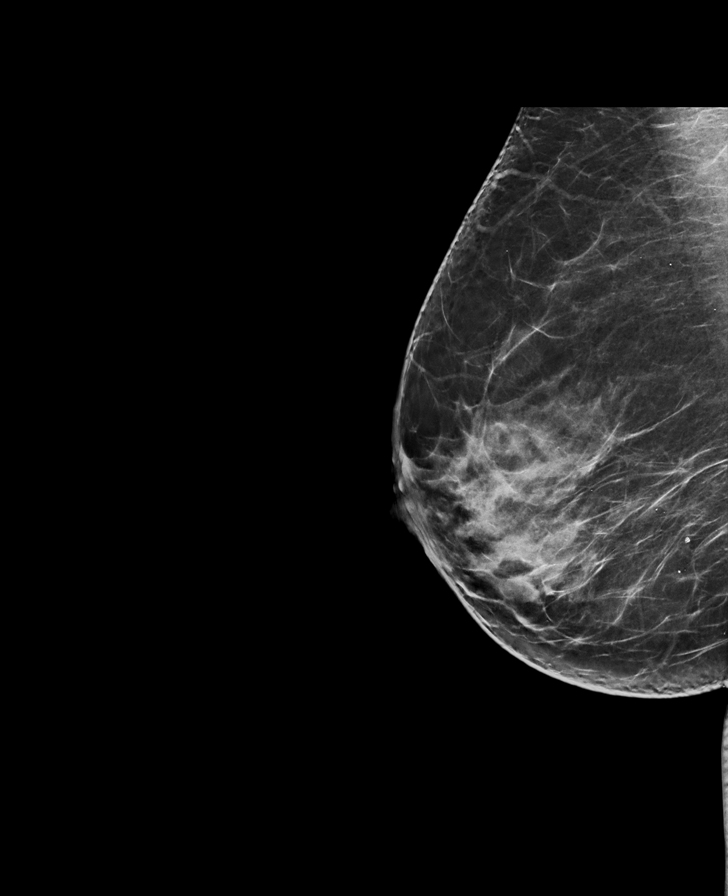

[R CC synth-2D]
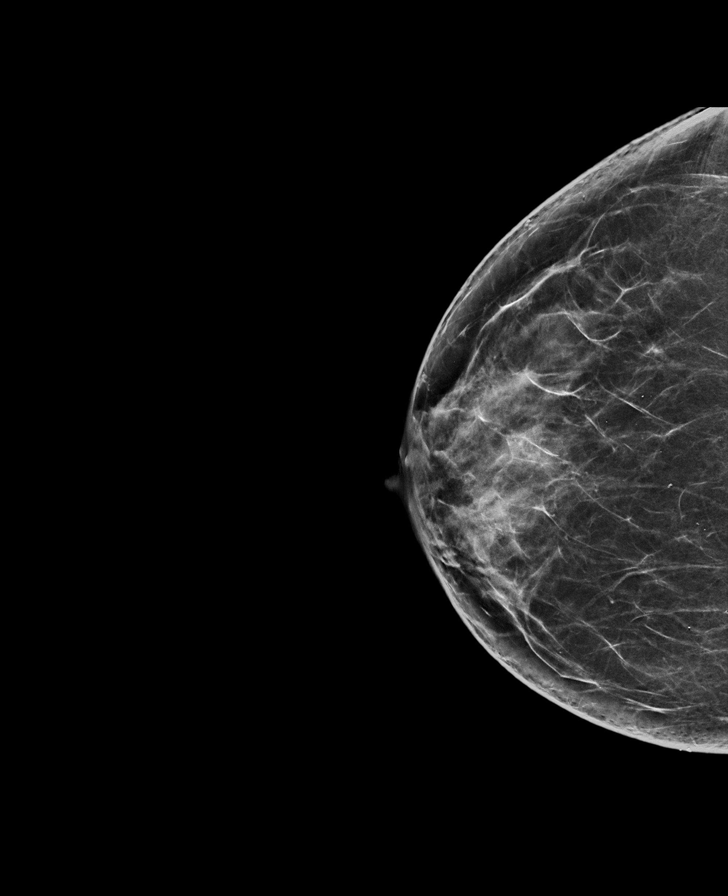

[L CC synth-2D]
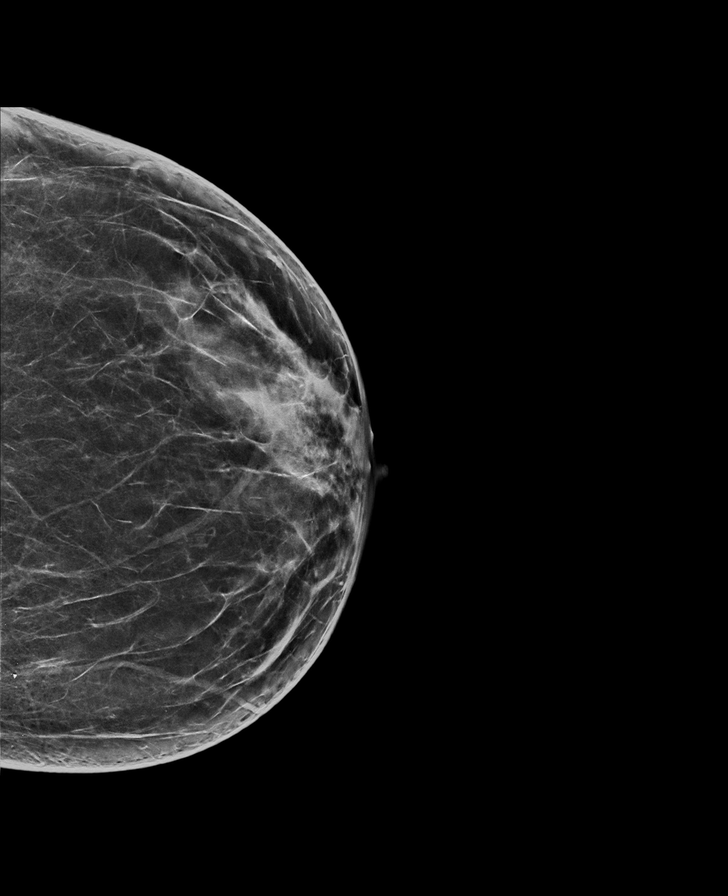

[L MLO synth-2D]
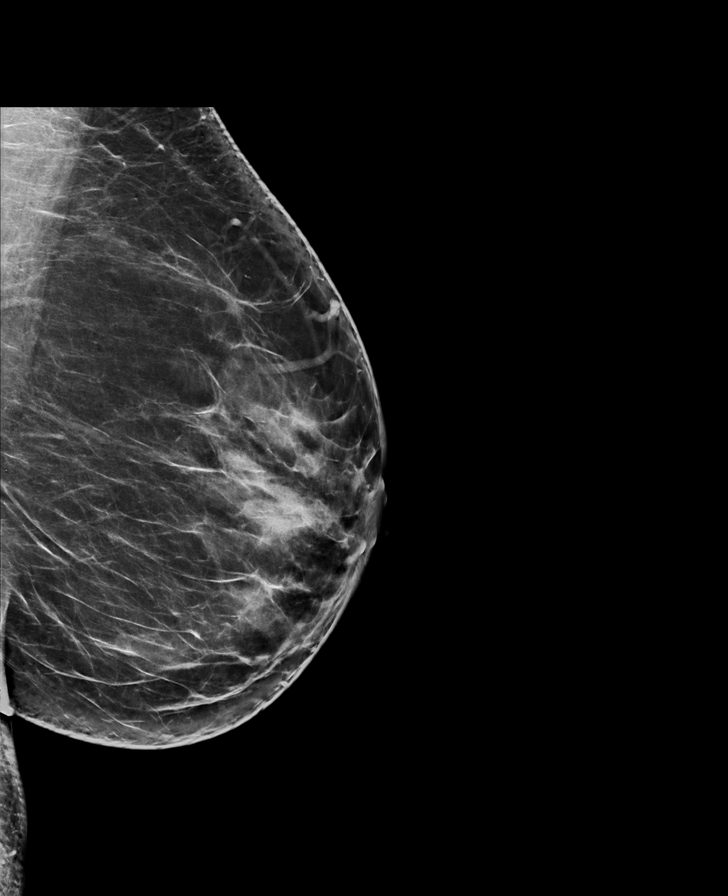

[R CC tomo · tomo slice 37/72.0]
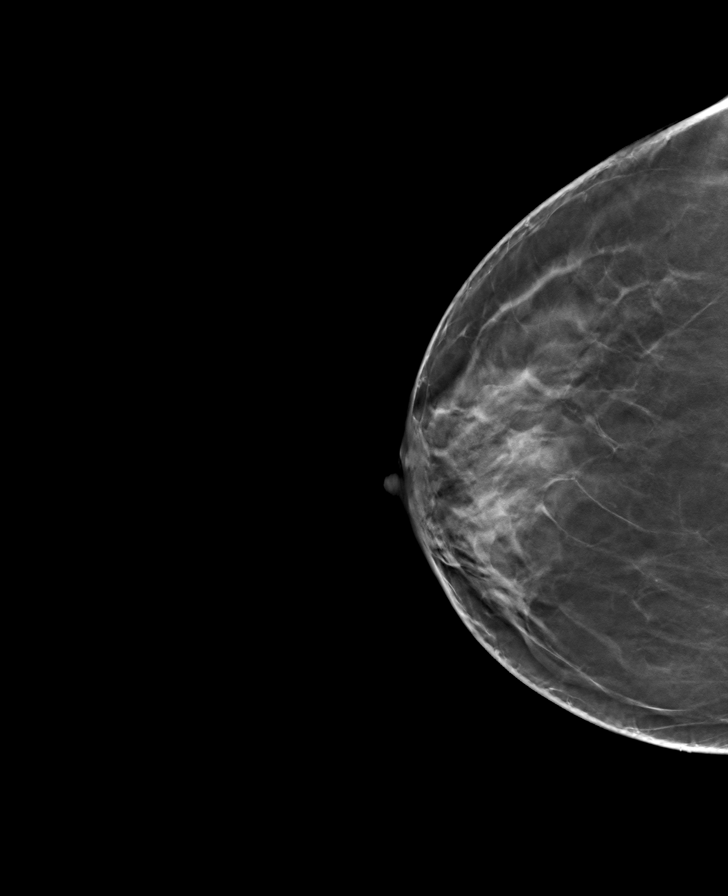

[L CC tomo · tomo slice 39/76.0]
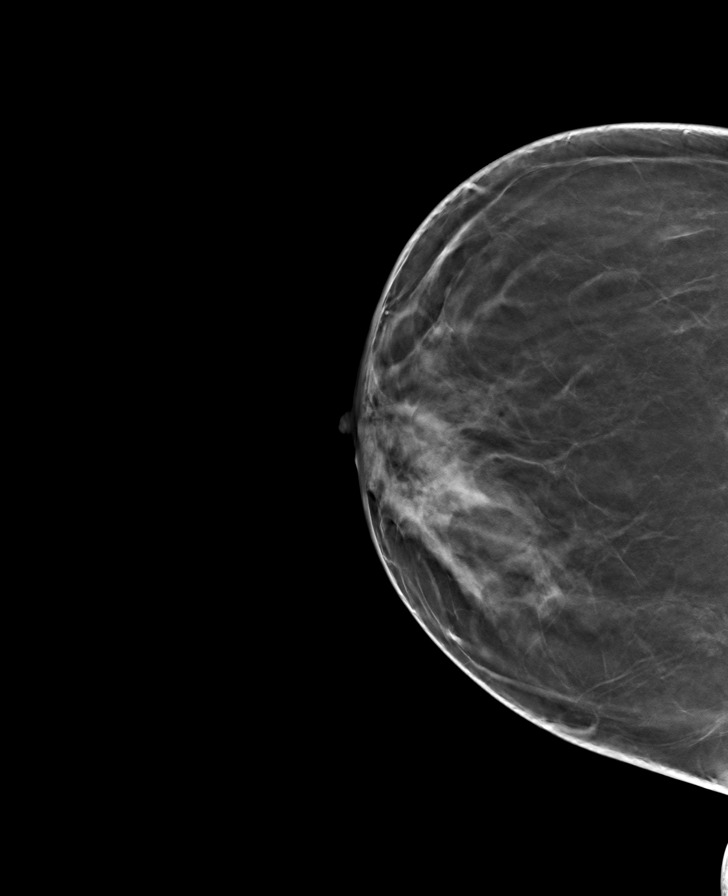

[L MLO tomo · tomo slice 43/85.0]
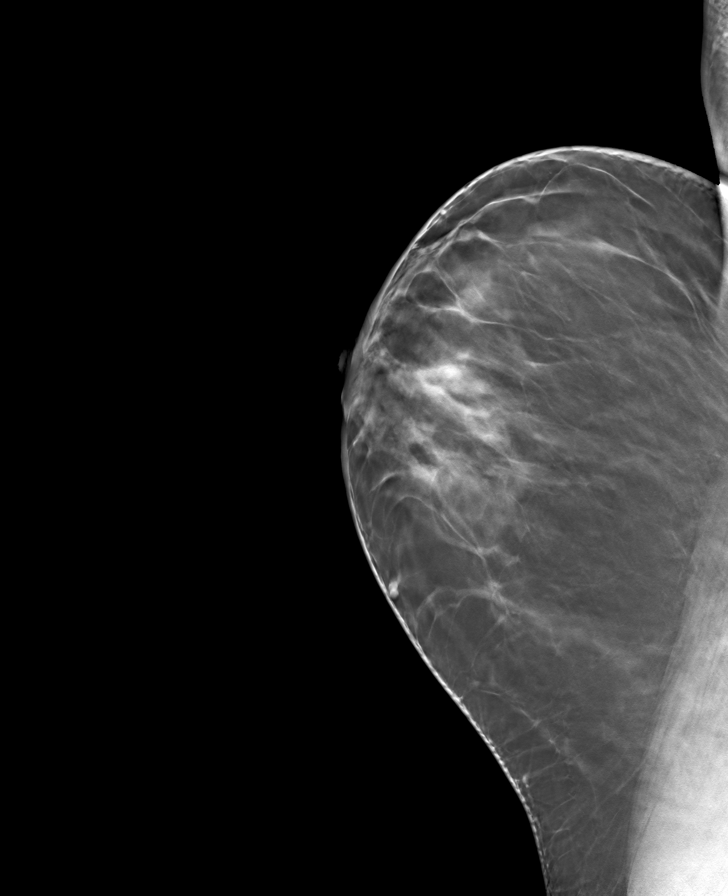

[R MLO tomo · tomo slice 39/77.0]
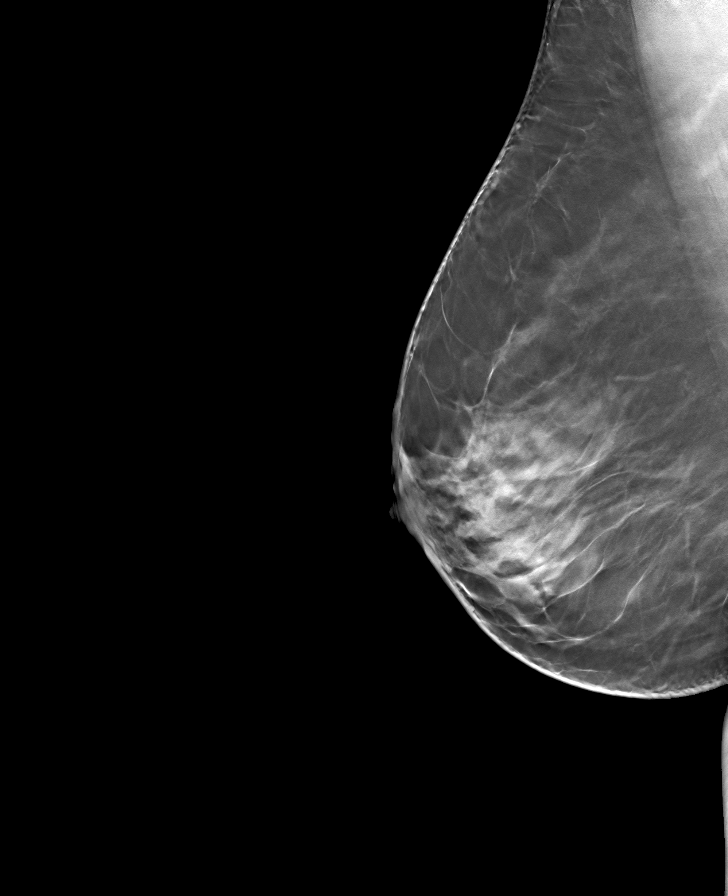

[8 of 24 positions shown; findings below may reference images not displayed]

ACR Breast Density Category c: The breast tissue is heterogeneously
dense, which may obscure small masses.
FINDINGS: There are no findings suspicious for malignancy. Images were
processed with CAD.
IMPRESSION: No mammographic evidence of malignancy. A result letter of this
screening mammogram will be mailed directly to the patient.

RECOMMENDATION:
Screening mammogram in one year. (Code:FT-[id number])

BI-RADS CATEGORY  1: Negative.

## 2019-05-14 NOTE — Progress Notes (Signed)
Office: [phone]  /  Fax: [phone]   HPI:   Chief Complaint: OBESITY Katie Henry is here to discuss her progress with her obesity treatment plan. She is on the Category 3 plan and she is following her eating plan approximately 50 % of the time. She states she is exercising 0 minutes 0 times per week. Katie Henry is well satisfied with the food on the plan. She has not journaled at lunch over the past few weeks. She has been off the plan recently due to her cousin's hospitalization due to [place].Marland Kitchen Her weight is 189 lb (85.7 kg) today and has had a weight loss of 1 pound over a period of 2 weeks since her last visit. She has lost 6 lbs since starting treatment with [place].  Hypertension Katie Henry is a 48 y.o. female with hypertension. She was started on Lisinopril 5 mg and began taking it Saturday (5 days ago). She has had no side effects from Lisinopril. Katie Henry denies chest pain or shortness of breath on exertion. She is working weight loss to help control her blood pressure with the goal of decreasing her risk of heart attack and stroke. Katie Henry blood pressure is currently controlled. BP Readings from Last 3 Encounters:  05/09/19 (!) 144/83  04/23/19 (!) 145/92  04/10/19 (!) 160/92    ASSESSMENT AND PLAN:  Essential hypertension  Class 1 obesity with serious comorbidity and body mass index (BMI) of 33.0 to 33.9 in adult, unspecified obesity type  PLAN:  Hypertension We discussed sodium restriction, working on healthy weight loss, and a regular exercise program as the means to achieve improved blood pressure control. Katie Henry agreed with this plan and agreed to follow up as directed. We will continue to monitor her blood pressure as well as her progress with the above lifestyle modifications. She will continue lisinopril as prescribed and will watch for signs of hypotension as she continues her lifestyle modifications.  Obesity Katie Henry is currently in the action stage of change.  As such, her goal is to continue with weight loss efforts She has agreed to keep a food journal with 350 to 500 calories and 30 grams of protein at lunch daily and follow the Category 3 plan Katie Henry has been instructed to work up to a goal of 150 minutes of combined cardio and strengthening exercise per week for weight loss and overall health benefits. We discussed the following Behavioral Modification Strategies today: planning for success and increasing lean protein intake  We discussed substitutions for 2 ounces of meat.  Katie Henry has agreed to follow up with our clinic in 2 to 3 weeks. She was informed of the importance of frequent follow up visits to maximize her success with intensive lifestyle modifications for her multiple health conditions.  ALLERGIES: Allergies  Allergen Reactions  . Zofran [Ondansetron Hcl] Other (See Comments)    Pt has hx of migraines; medication increases headaches and nausea   . Amoxicillin Nausea And Vomiting and Other (See Comments)    Has patient had a PCN reaction causing immediate rash, facial/tongue/throat swelling, SOB or lightheadedness with hypotension: Unknown Has patient had a PCN reaction causing severe rash involving mucus membranes or skin necrosis: No Has patient had a PCN reaction that required hospitalization: No Has patient had a PCN reaction occurring within the last 10 years: No If all of the above answers are "NO", then may proceed with Cephalosporin use.   . Codeine Nausea And Vomiting  . Imitrex [Sumatriptan] Nausea And Vomiting  .  Ultram [Tramadol Hcl] Nausea And Vomiting    MEDICATIONS: Current Outpatient Medications on File Prior to Visit  Medication Sig Dispense Refill  . acetaminophen (TYLENOL) 500 MG tablet Take 1,000 mg by mouth every 6 (six) hours as needed for mild pain or moderate pain.    Marland Kitchen ALPRAZolam (XANAX) 0.25 MG tablet Take 1 tablet (0.25 mg total) by mouth 2 (two) times daily as needed for anxiety or sleep. 30 tablet 1   . cetirizine (ZYRTEC) 10 MG tablet Take 1 tablet (10 mg total) by mouth daily. (Patient taking differently: Take 10 mg by mouth daily as needed for allergies. ) 30 tablet 0  . cholecalciferol (VITAMIN D3) 25 MCG (1000 UT) tablet Take 5,000 Units by mouth daily.    Marland Kitchen ELDERBERRY PO Take by mouth daily.    . fluticasone (FLONASE) 50 MCG/ACT nasal spray Place 2 sprays into both nostrils daily. 16 g 6  . hyoscyamine (LEVSIN SL) 0.125 MG SL tablet Place 1 tablet (0.125 mg total) under the tongue every 4 (four) hours as needed. 30 tablet 0  . lisinopril (ZESTRIL) 5 MG tablet Take 1 tablet (5 mg total) by mouth daily. 30 tablet 0  . meclizine (ANTIVERT) 25 MG tablet Take 1 tablet (25 mg total) by mouth 3 (three) times daily as needed for dizziness. 30 tablet 0  . Multiple Vitamins-Minerals (MULTIVITAMIN WITH MINERALS) tablet Take 1 tablet by mouth daily.    . promethazine (PHENERGAN) 25 MG tablet Take 1 tablet (25 mg total) by mouth every 6 (six) hours as needed for nausea or vomiting. 30 tablet 0  . SYNTHROID 175 MCG tablet TAKE 1 TABLET BY MOUTH DAILY BEFORE BREAKFAST 90 tablet 3  . valACYclovir (VALTREX) 1000 MG tablet 2 tablet BID for 1 day 8 tablet 0  . Vitamin D, Ergocalciferol, (DRISDOL) 1.25 MG (50000 UT) CAPS capsule Take 1 capsule (50,000 Units total) by mouth every 7 (seven) days. 4 capsule 0   No current facility-administered medications on file prior to visit.     PAST MEDICAL HISTORY: Past Medical History:  Diagnosis Date  . Anxiety   . Back pain   . Dental infection 05/28/2011  . Endometriosis   . Fainting episodes    Dr Luther Parody related  . Graves disease   . Grief reaction 11/15/2016  . History of kidney stones 2003   onset with pregnacy   . History of UTI    last 4-5-weeks ago  . Hyperglycemia 11/15/2016   cbg 100  . Hyperthyroidism   . 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 IBS (irritable bowel syndrome)   . Migraine   . Syncope   . Vitamin D  deficiency     PAST SURGICAL HISTORY: Past Surgical History:  Procedure Laterality Date  .  treatment  1998  . APPENDECTOMY  1991  . CYSTOSCOPY/URETEROSCOPY/HOLMIUM LASER/STENT PLACEMENT Left 08/09/2017   Procedure: CYSTOSCOPY/RETROGRADE/URETEROSCOPY/HOLMIUM LASER/STENT PLACEMENT;  Surgeon: Festus Aloe, MD;  Location: [facility];  Service: Urology;  Laterality: Left;  ONLY NEEDS 60 MIN  . ENDOMETRIAL ABLATION  01/2007  . EXTRACORPOREAL SHOCK WAVE LITHOTRIPSY Left 07/21/2017   Procedure: LEFT EXTRACORPOREAL SHOCK WAVE LITHOTRIPSY (ESWL);  Surgeon: Bjorn Loser, MD;  Location: [facility];  Service: Urology;  Laterality: Left;  Marland Kitchen VAGINAL HYSTERECTOMY  2010   ovaries still in place    SOCIAL HISTORY: Social History   Tobacco Use  . Smoking status: Former Smoker    Packs/day: 0.25    Years: 6.00  Pack years: 1.50    Types: Cigarettes  . Smokeless tobacco: Never Used  . Tobacco comment: quit 2010  Substance Use Topics  . Alcohol use: Yes    Frequency: Never    Comment: Ocassionally  . Drug use: No    FAMILY HISTORY: Family History  Problem Relation Age of Onset  . Hyperlipidemia Mother   . Heart disease Mother        bicuspid mitral valve  . Hyperlipidemia Maternal Grandmother   . Diabetes Maternal Grandmother   . Kidney disease Maternal Grandmother   . Hyperlipidemia Maternal Grandfather   . Heart disease Maternal Grandfather 58       MI  . Prostate cancer Maternal Grandfather   . Hypertension Father   . Dementia Father   . Seizures Father        s/p TBI  . Hypertension Paternal Grandfather   . Graves' disease Sister   . Obesity Brother   . Prostate cancer Other        maternal and paternal grandparents  . Heart attack Maternal Aunt        deceased age 62  . Heart disease Maternal Aunt   . Colon cancer Neg Hx   . Esophageal cancer Neg Hx     ROS: Review of Systems  Constitutional: Positive for weight loss.  Respiratory: Negative for shortness of breath  (on exertion).   Cardiovascular: Negative for chest pain.    PHYSICAL EXAM: Blood pressure (!) 144/83, pulse 78, temperature 98.4 F (36.9 C), temperature source Oral, height 5\' 3"  (1.6 m), weight 189 lb (85.7 kg), SpO2 99 %. Body mass index is 33.48 kg/m. Physical Exam Vitals signs reviewed.  Constitutional:      Appearance: Normal appearance. She is well-developed. She is obese.  Cardiovascular:     Rate and Rhythm: Normal rate.  Pulmonary:     Effort: Pulmonary effort is normal.  Musculoskeletal: Normal range of motion.  Skin:    General: Skin is warm and dry.  Neurological:     Mental Status: She is alert and oriented to person, place, and time.  Psychiatric:        Mood and Affect: Mood normal.        Behavior: Behavior normal.     RECENT LABS AND TESTS: BMET    Component Value Date/Time   NA 138 01/15/2019 0742   NA 137 12/13/2017 1150   K 3.6 01/15/2019 0742   CL 106 01/15/2019 0742   CO2 20 (L) 01/15/2019 0742   GLUCOSE 119 (H) 01/15/2019 0742   BUN 10 01/15/2019 0742   BUN 9 12/13/2017 1150   CREATININE 0.52 01/15/2019 0742   CREATININE 0.72 10/05/2013 1007   CALCIUM 9.1 01/15/2019 0742   GFRNONAA >60 01/15/2019 0742   GFRAA >60 01/15/2019 0742   Lab Results  Component Value Date   HGBA1C 5.0 02/15/2019   HGBA1C 5.1 12/13/2017   HGBA1C 5.0 11/12/2016   Lab Results  Component Value Date   INSULIN 18.9 02/15/2019   CBC    Component Value Date/Time   WBC 7.6 01/15/2019 0742   RBC 4.83 01/15/2019 0742   HGB 14.9 01/15/2019 0742   HGB 14.0 12/13/2017 1150   HCT 46.3 (H) 01/15/2019 0742   HCT 43.9 12/13/2017 1150   PLT 381 01/15/2019 0742   PLT 437 12/13/2017 1150   MCV 95.9 01/15/2019 0742   MCV 94 12/13/2017 1150   MCH 30.8 01/15/2019 0742   MCHC 32.2 01/15/2019 0742  RDW 11.8 01/15/2019 0742   RDW 12.5 12/13/2017 1150   LYMPHSABS 1.6 01/15/2019 0742   MONOABS 0.6 01/15/2019 0742   EOSABS 0.2 01/15/2019 0742   BASOSABS 0.1 01/15/2019  0742   Iron/TIBC/Ferritin/ %Sat    Component Value Date/Time   FERRITIN 213.4 11/27/2018 1151   Lipid Panel     Component Value Date/Time   CHOL 230 (H) 02/15/2019 1152   TRIG 171 (H) 02/15/2019 1152   HDL 56 02/15/2019 1152   CHOLHDL 3.1 12/13/2017 1150   CHOLHDL 3 11/12/2016 0751   VLDL 24.2 11/12/2016 0751   LDLCALC 140 (H) 02/15/2019 1152   Hepatic Function Panel     Component Value Date/Time   PROT 6.8 01/15/2019 0742   PROT 6.8 12/13/2017 1150   ALBUMIN 3.8 01/15/2019 0742   ALBUMIN 4.2 12/13/2017 1150   AST 27 01/15/2019 0742   ALT 36 01/15/2019 0742   ALKPHOS 72 01/15/2019 0742   BILITOT 0.6 01/15/2019 0742   BILITOT 0.5 12/13/2017 1150   BILIDIR 0.1 10/05/2013 1007   IBILI 0.3 10/05/2013 1007      Component Value Date/Time   TSH 0.693 02/15/2019 1152   TSH 4.28 07/21/2018 0745   TSH 0.750 12/13/2017 1150     Ref. Range 02/15/2019 11:52  Vitamin D, 25-Hydroxy Latest Ref Range: 30.0 - 100.0 ng/mL 33.4    OBESITY BEHAVIORAL INTERVENTION VISIT  Today's visit was # 5   Starting weight: 195 lbs Starting date: 02/15/2019 Today's weight : 189 lbs Today's date: 05/09/2019 Total lbs lost to date: 6    05/09/2019  Height 5\' 3"  (1.6 m)  Weight 189 lb (85.7 kg)  BMI (Calculated) 33.49  BLOOD PRESSURE - SYSTOLIC 123456  BLOOD PRESSURE - DIASTOLIC 83   Body Fat % 0000000 %  Total Body Water (lbs) 77.8 lbs    ASK: We discussed the diagnosis of obesity with Caffie Pinto today and Katie Henry agreed to give [place] permission to discuss obesity behavioral modification therapy today.  ASSESS: Katie Henry has the diagnosis of obesity and her BMI today is 33.49 Katie Henry is in the action stage of change   ADVISE: Katie Henry was educated on the multiple health risks of obesity as well as the benefit of weight loss to improve her health. She was advised of the need for long term treatment and the importance of lifestyle modifications to improve her current health and to decrease her  risk of future health problems.  AGREE: Multiple dietary modification options and treatment options were discussed and  Katie Henry agreed to follow the recommendations documented in the above note.  ARRANGE: Katie Henry was educated on the importance of frequent visits to treat obesity as outlined per CMS and USPSTF guidelines and agreed to schedule her next follow up appointment today.  Corey Skains, am acting as Location manager for [employer], FNP-C.  I have reviewed the above documentation for accuracy and completeness, and I agree with the above.  - Kenichi Cassada, FNP-C.

## 2019-05-15 ENCOUNTER — Encounter (INDEPENDENT_AMBULATORY_CARE_PROVIDER_SITE_OTHER): Payer: Self-pay | Admitting: Family Medicine

## 2019-05-15 DIAGNOSIS — E669 Obesity, unspecified: Secondary | ICD-10-CM | POA: Insufficient documentation

## 2019-05-15 DIAGNOSIS — I10 Essential (primary) hypertension: Secondary | ICD-10-CM | POA: Insufficient documentation

## 2019-05-15 DIAGNOSIS — Z6833 Body mass index (BMI) 33.0-33.9, adult: Secondary | ICD-10-CM | POA: Insufficient documentation

## 2019-05-18 ENCOUNTER — Ambulatory Visit (INDEPENDENT_AMBULATORY_CARE_PROVIDER_SITE_OTHER): Payer: No Typology Code available for payment source | Admitting: Family Medicine

## 2019-05-18 ENCOUNTER — Other Ambulatory Visit: Payer: Self-pay

## 2019-05-18 DIAGNOSIS — M545 Low back pain, unspecified: Secondary | ICD-10-CM

## 2019-05-18 MED ORDER — TIZANIDINE HCL 2 MG PO TABS: 1.0000 mg | ORAL_TABLET | Freq: Two times a day (BID) | ORAL | 0 refills | Status: DC | PRN

## 2019-05-18 MED ORDER — METHYLPREDNISOLONE 4 MG PO TABS: ORAL_TABLET | ORAL | 0 refills | Status: DC

## 2019-05-18 MED FILL — tiZANidine HCL 2 MG TABS: 2 | 8 days supply | Qty: 30 | Fill #0

## 2019-05-18 MED FILL — METHYLPREDNISOLONE 4 MG TAB: 4 | 5 days supply | Qty: 15 | Fill #0

## 2019-05-20 DIAGNOSIS — M545 Low back pain, unspecified: Secondary | ICD-10-CM | POA: Insufficient documentation

## 2019-05-20 NOTE — Assessment & Plan Note (Signed)
Encouraged moist heat and gentle stretching as tolerated. May try NSAIDs and prescription meds as directed and report if symptoms worsen or seek immediate care. No acute injury, incontinence or trauma. Medrol dosepak and Tizanidine bid prn.

## 2019-05-20 NOTE — Progress Notes (Signed)
Virtual Visit via Video Note  I connected with Katie Henry on 05/20/19 at  2:00 PM EDT by a video enabled telemedicine application and verified that I am speaking with the correct person using two identifiers.  Location: Patient: home Provider: home   I discussed the limitations of evaluation and management by telemedicine and the availability of in person appointments. The patient expressed understanding and agreed to proceed. Princess Eulas Post CMA was able to get the patient set up on a phone visit after being unable to st up a phone visit.    Subjective:    Patient ID: Katie Henry, female    DOB: 1971/06/27, 48 y.o.   MRN: [id number]  No chief complaint on file.   HPI Patient is in today for evaluation of low back pain. She denies any trauma or fall. No recent febrile illness or hospitalizations. She has had low back pain for over a week. No incontinence. It had been improving and then it got worse again in the past week. Denies CP/palp/SOB/HA/congestion/fevers/GI or GU c/o. Taking meds as prescribed  Past Medical History:  Diagnosis Date  . Anxiety   . Back pain   . Dental infection 05/28/2011  . Endometriosis   . Fainting episodes    Dr Luther Parody related  . Graves disease   . Grief reaction 11/15/2016  . History of kidney stones 2003   onset with pregnacy   . History of UTI    last 4-5-weeks ago  . Hyperglycemia 11/15/2016   cbg 100  . Hyperthyroidism   . 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 IBS (irritable bowel syndrome)   . Migraine   . Syncope   . Vitamin D deficiency     Past Surgical History:  Procedure Laterality Date  .  treatment  1998  . APPENDECTOMY  1991  . CYSTOSCOPY/URETEROSCOPY/HOLMIUM LASER/STENT PLACEMENT Left 08/09/2017   Procedure: CYSTOSCOPY/RETROGRADE/URETEROSCOPY/HOLMIUM LASER/STENT PLACEMENT;  Surgeon: Festus Aloe, MD;  Location: [facility];  Service: Urology;  Laterality: Left;  ONLY NEEDS 60 MIN  . ENDOMETRIAL  ABLATION  01/2007  . EXTRACORPOREAL SHOCK WAVE LITHOTRIPSY Left 07/21/2017   Procedure: LEFT EXTRACORPOREAL SHOCK WAVE LITHOTRIPSY (ESWL);  Surgeon: Bjorn Loser, MD;  Location: [facility];  Service: Urology;  Laterality: Left;  Marland Kitchen VAGINAL HYSTERECTOMY  2010   ovaries still in place    Family History  Problem Relation Age of Onset  . Hyperlipidemia Mother   . Heart disease Mother        bicuspid mitral valve  . Hyperlipidemia Maternal Grandmother   . Diabetes Maternal Grandmother   . Kidney disease Maternal Grandmother   . Hyperlipidemia Maternal Grandfather   . Heart disease Maternal Grandfather 62       MI  . Prostate cancer Maternal Grandfather   . Hypertension Father   . Dementia Father   . Seizures Father        s/p TBI  . Hypertension Paternal Grandfather   . Graves' disease Sister   . Obesity Brother   . Prostate cancer Other        maternal and paternal grandparents  . Heart attack Maternal Aunt        deceased age 47  . Heart disease Maternal Aunt   . Colon cancer Neg Hx   . Esophageal cancer Neg Hx     Social History   Socioeconomic History  . Marital status: Married    Spouse name: Chabely Sensabaugh  . Number of children: 2  .  Years of education: 11  . Highest education level: Not on file  Occupational History    Employer: [employer]  . Financial resource strain: Not on file  . Food insecurity    Worry: Not on file    Inability: Not on file  . Transportation needs    Medical: Not on file    Non-medical: Not on file  Tobacco Use  . Smoking status: Former Smoker    Packs/day: 0.25    Years: 6.00    Pack years: 1.50    Types: Cigarettes  . Smokeless tobacco: Never Used  . Tobacco comment: quit 2010  Substance and Sexual Activity  . Alcohol use: Yes    Frequency: Never    Comment: Ocassionally  . Drug use: No  . Sexual activity: Yes    Partners: Male    Comment: work at [employer], lives with fiance and son  Lifestyle  . Physical  activity    Days per week: Not on file    Minutes per session: Not on file  . Stress: Not on file  Relationships  . Social Herbalist on phone: Not on file    Gets together: Not on file    Attends religious service: Not on file    Active member of club or organization: Not on file    Attends meetings of clubs or organizations: Not on file    Relationship status: Not on file  . Intimate partner violence    Fear of current or ex partner: Not on file    Emotionally abused: Not on file    Physically abused: Not on file    Forced sexual activity: Not on file  Other Topics Concern  . Not on file  Social History Narrative   Engaged, two sons.   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   No Tob, occ alcohol, no drugs.    Outpatient Medications Prior to Visit  Medication Sig Dispense Refill  . acetaminophen (TYLENOL) 500 MG tablet Take 1,000 mg by mouth every 6 (six) hours as needed for mild pain or moderate pain.    Marland Kitchen ALPRAZolam (XANAX) 0.25 MG tablet Take 1 tablet (0.25 mg total) by mouth 2 (two) times daily as needed for anxiety or sleep. 30 tablet 1  . cetirizine (ZYRTEC) 10 MG tablet Take 1 tablet (10 mg total) by mouth daily. (Patient taking differently: Take 10 mg by mouth daily as needed for allergies. ) 30 tablet 0  . cholecalciferol (VITAMIN D3) 25 MCG (1000 UT) tablet Take 5,000 Units by mouth daily.    Marland Kitchen ELDERBERRY PO Take by mouth daily.    . fluticasone (FLONASE) 50 MCG/ACT nasal spray Place 2 sprays into both nostrils daily. 16 g 6  . hyoscyamine (LEVSIN SL) 0.125 MG SL tablet Place 1 tablet (0.125 mg total) under the tongue every 4 (four) hours as needed. 30 tablet 0  . lisinopril (ZESTRIL) 5 MG tablet Take 1 tablet (5 mg total) by mouth daily. 30 tablet 0  . meclizine (ANTIVERT) 25 MG tablet Take 1 tablet (25 mg total) by mouth 3 (three) times daily as needed for dizziness. 30 tablet 0  . Multiple Vitamins-Minerals  (MULTIVITAMIN WITH MINERALS) tablet Take 1 tablet by mouth daily.    . promethazine (PHENERGAN) 25 MG tablet Take 1 tablet (25 mg total) by mouth every 6 (six) hours as needed for nausea or vomiting. 30 tablet 0  .  SYNTHROID 175 MCG tablet TAKE 1 TABLET BY MOUTH DAILY BEFORE BREAKFAST 90 tablet 3  . valACYclovir (VALTREX) 1000 MG tablet 2 tablet BID for 1 day 8 tablet 0  . Vitamin D, Ergocalciferol, (DRISDOL) 1.25 MG (50000 UT) CAPS capsule Take 1 capsule (50,000 Units total) by mouth every 7 (seven) days. 4 capsule 0   No facility-administered medications prior to visit.     Allergies  Allergen Reactions  . Zofran [Ondansetron Hcl] Other (See Comments)    Pt has hx of migraines; medication increases headaches and nausea   . Amoxicillin Nausea And Vomiting and Other (See Comments)    Has patient had a PCN reaction causing immediate rash, facial/tongue/throat swelling, SOB or lightheadedness with hypotension: Unknown Has patient had a PCN reaction causing severe rash involving mucus membranes or skin necrosis: No Has patient had a PCN reaction that required hospitalization: No Has patient had a PCN reaction occurring within the last 10 years: No If all of the above answers are "NO", then may proceed with Cephalosporin use.   . Codeine Nausea And Vomiting  . Imitrex [Sumatriptan] Nausea And Vomiting  . Ultram [Tramadol Hcl] Nausea And Vomiting    Review of Systems  Constitutional: Negative for fever and malaise/fatigue.  HENT: Negative for congestion.   Eyes: Negative for blurred vision.  Respiratory: Negative for shortness of breath.   Cardiovascular: Negative for chest pain, palpitations and leg swelling.  Gastrointestinal: Negative for abdominal pain, blood in stool and nausea.  Genitourinary: Negative for dysuria and frequency.  Musculoskeletal: Positive for back pain. Negative for falls.  Skin: Negative for rash.  Neurological: Negative for dizziness, loss of consciousness and  headaches.  Endo/Heme/Allergies: Negative for environmental allergies.  Psychiatric/Behavioral: Negative for depression. The patient is not nervous/anxious.        Objective:    Physical Exam Unable to obtain via phone visit.  There were no vitals taken for this visit. Wt Readings from Last 3 Encounters:  05/09/19 189 lb (85.7 kg)  04/23/19 190 lb (86.2 kg)  04/10/19 191 lb (86.6 kg)    Diabetic Foot Exam - Simple   No data filed     Lab Results  Component Value Date   WBC 7.6 01/15/2019   HGB 14.9 01/15/2019   HCT 46.3 (H) 01/15/2019   PLT 381 01/15/2019   GLUCOSE 119 (H) 01/15/2019   CHOL 230 (H) 02/15/2019   TRIG 171 (H) 02/15/2019   HDL 56 02/15/2019   LDLCALC 140 (H) 02/15/2019   ALT 36 01/15/2019   AST 27 01/15/2019   NA 138 01/15/2019   K 3.6 01/15/2019   CL 106 01/15/2019   CREATININE 0.52 01/15/2019   BUN 10 01/15/2019   CO2 20 (L) 01/15/2019   TSH 0.693 02/15/2019   HGBA1C 5.0 02/15/2019    Lab Results  Component Value Date   TSH 0.693 02/15/2019   Lab Results  Component Value Date   WBC 7.6 01/15/2019   HGB 14.9 01/15/2019   HCT 46.3 (H) 01/15/2019   MCV 95.9 01/15/2019   PLT 381 01/15/2019   Lab Results  Component Value Date   NA 138 01/15/2019   K 3.6 01/15/2019   CO2 20 (L) 01/15/2019   GLUCOSE 119 (H) 01/15/2019   BUN 10 01/15/2019   CREATININE 0.52 01/15/2019   BILITOT 0.6 01/15/2019   ALKPHOS 72 01/15/2019   AST 27 01/15/2019   ALT 36 01/15/2019   PROT 6.8 01/15/2019   ALBUMIN 3.8 01/15/2019   CALCIUM  9.1 01/15/2019   ANIONGAP 12 01/15/2019   GFR 119.54 06/29/2017   Lab Results  Component Value Date   CHOL 230 (H) 02/15/2019   Lab Results  Component Value Date   HDL 56 02/15/2019   Lab Results  Component Value Date   LDLCALC 140 (H) 02/15/2019   Lab Results  Component Value Date   TRIG 171 (H) 02/15/2019   Lab Results  Component Value Date   CHOLHDL 3.1 12/13/2017   Lab Results  Component Value Date    HGBA1C 5.0 02/15/2019       Assessment & Plan:   Problem List Items Addressed This Visit    Low back pain    Encouraged moist heat and gentle stretching as tolerated. May try NSAIDs and prescription meds as directed and report if symptoms worsen or seek immediate care. No acute injury, incontinence or trauma. Medrol dosepak and Tizanidine bid prn.       Relevant Medications   methylPREDNISolone (MEDROL) 4 MG tablet   tiZANidine (ZANAFLEX) 2 MG tablet      I am having Katie Henry start on methylPREDNISolone and tiZANidine. I am also having her maintain her fluticasone, cetirizine, acetaminophen, valACYclovir, Synthroid, ALPRAZolam, hyoscyamine, meclizine, promethazine, cholecalciferol, ELDERBERRY PO, multivitamin with minerals, Vitamin D (Ergocalciferol), and lisinopril.  Meds ordered this encounter  Medications  . methylPREDNISolone (MEDROL) 4 MG tablet    Sig: 5 tab po qd X 1d then 4 tab po qd X 1d then 3 tab po qd X 1d then 2 tab po qd then 1 tab po qd    Dispense:  15 tablet    Refill:  0  . tiZANidine (ZANAFLEX) 2 MG tablet    Sig: Take 0.5-2 tablets (1-4 mg total) by mouth 2 (two) times daily as needed for muscle spasms.    Dispense:  30 tablet    Refill:  0       I discussed the assessment and treatment plan with the patient. The patient was provided an opportunity to ask questions and all were answered. The patient agreed with the plan and demonstrated an understanding of the instructions.   The patient was advised to call back or seek an in-person evaluation if the symptoms worsen or if the condition fails to improve as anticipated.  I provided 25 minutes of non-face-to-face time during this encounter.   Penni Homans, MD

## 2019-06-12 MED FILL — VIT D2 1.25 MG (50,000 UNIT: 1.25 MG | 28 days supply | Qty: 4 | Fill #0

## 2019-06-12 MED FILL — SYNTHROID 175 MCG TABLET: 175 | 30 days supply | Qty: 30 | Fill #9

## 2019-06-18 ENCOUNTER — Other Ambulatory Visit: Payer: Self-pay

## 2019-06-19 ENCOUNTER — Ambulatory Visit (INDEPENDENT_AMBULATORY_CARE_PROVIDER_SITE_OTHER): Payer: No Typology Code available for payment source | Admitting: Family Medicine

## 2019-06-19 ENCOUNTER — Encounter: Payer: Self-pay | Admitting: Family Medicine

## 2019-06-19 VITALS — BP 130/94 | HR 100 | Temp 98.6°F | Resp 18 | Wt 189.3 lb

## 2019-06-19 DIAGNOSIS — F4320 Adjustment disorder, unspecified: Secondary | ICD-10-CM

## 2019-06-19 DIAGNOSIS — E038 Other specified hypothyroidism: Secondary | ICD-10-CM | POA: Diagnosis not present

## 2019-06-19 DIAGNOSIS — E785 Hyperlipidemia, unspecified: Secondary | ICD-10-CM

## 2019-06-19 DIAGNOSIS — E559 Vitamin D deficiency, unspecified: Secondary | ICD-10-CM | POA: Diagnosis not present

## 2019-06-19 DIAGNOSIS — R739 Hyperglycemia, unspecified: Secondary | ICD-10-CM

## 2019-06-19 DIAGNOSIS — Z6833 Body mass index (BMI) 33.0-33.9, adult: Secondary | ICD-10-CM

## 2019-06-19 DIAGNOSIS — F419 Anxiety disorder, unspecified: Secondary | ICD-10-CM

## 2019-06-19 DIAGNOSIS — F4321 Adjustment disorder with depressed mood: Secondary | ICD-10-CM | POA: Diagnosis not present

## 2019-06-19 DIAGNOSIS — F32A Depression, unspecified: Secondary | ICD-10-CM

## 2019-06-19 DIAGNOSIS — F329 Major depressive disorder, single episode, unspecified: Secondary | ICD-10-CM

## 2019-06-19 DIAGNOSIS — I10 Essential (primary) hypertension: Secondary | ICD-10-CM

## 2019-06-19 DIAGNOSIS — Z1239 Encounter for other screening for malignant neoplasm of breast: Secondary | ICD-10-CM

## 2019-06-19 DIAGNOSIS — E669 Obesity, unspecified: Secondary | ICD-10-CM

## 2019-06-19 NOTE — Assessment & Plan Note (Signed)
Patient is doing OK at present but continues to struggle with several family concerns. She is in the process of starting counseling and she will let us know if we need to alter medications.

## 2019-06-19 NOTE — Assessment & Plan Note (Signed)
hgba1c acceptable, minimize simple carbs. Increase exercise as tolerated.  

## 2019-06-19 NOTE — Assessment & Plan Note (Signed)
On Levothyroxine, continue to monitor 

## 2019-06-19 NOTE — Assessment & Plan Note (Signed)
MGM ordered today

## 2019-06-19 NOTE — Progress Notes (Signed)
Subjective:    Patient ID: Katie Henry, female    DOB: 1970-09-07, 48 y.o.   MRN: [id number]  No chief complaint on file.   HPI Patient is in today for follow up on chronic medical concerns including hypertension, hyperlipidemia and hyperglycemia. No recent febrile illness or hospitalizations. No c/o polyuria or polydipsia. Continues to struggle with family stressors but is managing adequately. Denies CP/palp/SOB/HA/congestion/fevers/GI or GU c/o. Taking meds as prescribed. Continues to try and loose weight slowly and is maintaining a heart healthy diet.   Past Medical History:  Diagnosis Date  . Anxiety   . Back pain   . Dental infection 05/28/2011  . Endometriosis   . Fainting episodes    Dr Luther Parody related  . Graves disease   . Grief reaction 11/15/2016  . History of kidney stones 2003   onset with pregnacy   . History of UTI    last 4-5-weeks ago  . Hyperglycemia 11/15/2016   cbg 100  . Hyperthyroidism   . 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 IBS (irritable bowel syndrome)   . Migraine   . Syncope   . Vitamin D deficiency     Past Surgical History:  Procedure Laterality Date  .  treatment  1998  . APPENDECTOMY  1991  . CYSTOSCOPY/URETEROSCOPY/HOLMIUM LASER/STENT PLACEMENT Left 08/09/2017   Procedure: CYSTOSCOPY/RETROGRADE/URETEROSCOPY/HOLMIUM LASER/STENT PLACEMENT;  Surgeon: Festus Aloe, MD;  Location: [facility];  Service: Urology;  Laterality: Left;  ONLY NEEDS 60 MIN  . ENDOMETRIAL ABLATION  01/2007  . EXTRACORPOREAL SHOCK WAVE LITHOTRIPSY Left 07/21/2017   Procedure: LEFT EXTRACORPOREAL SHOCK WAVE LITHOTRIPSY (ESWL);  Surgeon: Bjorn Loser, MD;  Location: [facility];  Service: Urology;  Laterality: Left;  Marland Kitchen VAGINAL HYSTERECTOMY  2010   ovaries still in place    Family History  Problem Relation Age of Onset  . Hyperlipidemia Mother   . Heart disease Mother        bicuspid mitral valve  . Hyperlipidemia Maternal Grandmother   .  Diabetes Maternal Grandmother   . Kidney disease Maternal Grandmother   . Hyperlipidemia Maternal Grandfather   . Heart disease Maternal Grandfather 5       MI  . Prostate cancer Maternal Grandfather   . Hypertension Father   . Dementia Father   . Seizures Father        s/p TBI  . Hypertension Paternal Grandfather   . Graves' disease Sister   . Obesity Brother   . Prostate cancer Other        maternal and paternal grandparents  . Heart attack Maternal Aunt        deceased age 80  . Heart disease Maternal Aunt   . Colon cancer Neg Hx   . Esophageal cancer Neg Hx     Social History   Socioeconomic History  . Marital status: Married    Spouse name: Rumi Cerna  . Number of children: 2  . Years of education: 87  . Highest education level: Not on file  Occupational History    Employer: [employer]  . Financial resource strain: Not on file  . Food insecurity    Worry: Not on file    Inability: Not on file  . Transportation needs    Medical: Not on file    Non-medical: Not on file  Tobacco Use  . Smoking status: Former Smoker    Packs/day: 0.25    Years: 6.00    Pack years:  1.50    Types: Cigarettes  . Smokeless tobacco: Never Used  . Tobacco comment: quit 2010  Substance and Sexual Activity  . Alcohol use: Yes    Frequency: Never    Comment: Ocassionally  . Drug use: No  . Sexual activity: Yes    Partners: Male    Comment: work at [employer], lives with fiance and son  Lifestyle  . Physical activity    Days per week: Not on file    Minutes per session: Not on file  . Stress: Not on file  Relationships  . Social Herbalist on phone: Not on file    Gets together: Not on file    Attends religious service: Not on file    Active member of club or organization: Not on file    Attends meetings of clubs or organizations: Not on file    Relationship status: Not on file  . Intimate partner violence    Fear of current or ex partner: Not on  file    Emotionally abused: Not on file    Physically abused: Not on file    Forced sexual activity: Not on file  Other Topics Concern  . Not on file  Social History Narrative   Engaged, two sons.   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   No Tob, occ alcohol, no drugs.    Outpatient Medications Prior to Visit  Medication Sig Dispense Refill  . acetaminophen (TYLENOL) 500 MG tablet Take 1,000 mg by mouth every 6 (six) hours as needed for mild pain or moderate pain.    Marland Kitchen ALPRAZolam (XANAX) 0.25 MG tablet Take 1 tablet (0.25 mg total) by mouth 2 (two) times daily as needed for anxiety or sleep. 30 tablet 1  . cetirizine (ZYRTEC) 10 MG tablet Take 1 tablet (10 mg total) by mouth daily. (Patient taking differently: Take 10 mg by mouth daily as needed for allergies. ) 30 tablet 0  . cholecalciferol (VITAMIN D3) 25 MCG (1000 UT) tablet Take 5,000 Units by mouth daily.    Marland Kitchen ELDERBERRY PO Take by mouth daily.    . fluticasone (FLONASE) 50 MCG/ACT nasal spray Place 2 sprays into both nostrils daily. 16 g 6  . hyoscyamine (LEVSIN SL) 0.125 MG SL tablet Place 1 tablet (0.125 mg total) under the tongue every 4 (four) hours as needed. 30 tablet 0  . lisinopril (ZESTRIL) 5 MG tablet Take 1 tablet (5 mg total) by mouth daily. 30 tablet 0  . meclizine (ANTIVERT) 25 MG tablet Take 1 tablet (25 mg total) by mouth 3 (three) times daily as needed for dizziness. 30 tablet 0  . Multiple Vitamins-Minerals (MULTIVITAMIN WITH MINERALS) tablet Take 1 tablet by mouth daily.    . promethazine (PHENERGAN) 25 MG tablet Take 1 tablet (25 mg total) by mouth every 6 (six) hours as needed for nausea or vomiting. 30 tablet 0  . SYNTHROID 175 MCG tablet TAKE 1 TABLET BY MOUTH DAILY BEFORE BREAKFAST 90 tablet 3  . tiZANidine (ZANAFLEX) 2 MG tablet Take 0.5-2 tablets (1-4 mg total) by mouth 2 (two) times daily as needed for muscle spasms. 30 tablet 0  . valACYclovir (VALTREX) 1000 MG  tablet 2 tablet BID for 1 day 8 tablet 0  . Vitamin D, Ergocalciferol, (DRISDOL) 1.25 MG (50000 UT) CAPS capsule Take 1 capsule (50,000 Units total) by mouth every 7 (seven) days. 4 capsule 0  . methylPREDNISolone (MEDROL) 4 MG tablet  5 tab po qd X 1d then 4 tab po qd X 1d then 3 tab po qd X 1d then 2 tab po qd then 1 tab po qd 15 tablet 0   No facility-administered medications prior to visit.     Allergies  Allergen Reactions  . Zofran [Ondansetron Hcl] Other (See Comments)    Pt has hx of migraines; medication increases headaches and nausea   . Amoxicillin Nausea And Vomiting and Other (See Comments)    Has patient had a PCN reaction causing immediate rash, facial/tongue/throat swelling, SOB or lightheadedness with hypotension: Unknown Has patient had a PCN reaction causing severe rash involving mucus membranes or skin necrosis: No Has patient had a PCN reaction that required hospitalization: No Has patient had a PCN reaction occurring within the last 10 years: No If all of the above answers are "NO", then may proceed with Cephalosporin use.   . Codeine Nausea And Vomiting  . Imitrex [Sumatriptan] Nausea And Vomiting  . Ultram [Tramadol Hcl] Nausea And Vomiting    Review of Systems  Constitutional: Positive for malaise/fatigue. Negative for fever.  HENT: Negative for congestion.   Eyes: Negative for blurred vision.  Respiratory: Negative for shortness of breath.   Cardiovascular: Negative for chest pain, palpitations and leg swelling.  Gastrointestinal: Negative for abdominal pain, blood in stool and nausea.  Genitourinary: Negative for dysuria and frequency.  Musculoskeletal: Negative for falls.  Skin: Negative for rash.  Neurological: Negative for dizziness, loss of consciousness and headaches.  Endo/Heme/Allergies: Negative for environmental allergies.  Psychiatric/Behavioral: Negative for depression. The patient is nervous/anxious.        Objective:    Physical Exam  Vitals signs and nursing note reviewed.  Constitutional:      General: She is not in acute distress.    Appearance: She is well-developed.  HENT:     Head: Normocephalic and atraumatic.     Nose: Nose normal.  Eyes:     General:        Right eye: No discharge.        Left eye: No discharge.  Neck:     Musculoskeletal: Normal range of motion and neck supple.  Cardiovascular:     Rate and Rhythm: Normal rate and regular rhythm.     Heart sounds: No murmur.  Pulmonary:     Effort: Pulmonary effort is normal.     Breath sounds: Normal breath sounds.  Abdominal:     General: Bowel sounds are normal.     Palpations: Abdomen is soft.     Tenderness: There is no abdominal tenderness.  Skin:    General: Skin is warm and dry.  Neurological:     Mental Status: She is alert and oriented to person, place, and time.     BP (!) 130/94 (BP Location: Left Arm, Patient Position: Sitting, Cuff Size: Normal)   Pulse 100   Temp 98.6 F (37 C) (Temporal)   Resp 18   Wt 189 lb 4.8 oz (85.9 kg)   SpO2 99%   BMI 33.53 kg/m  Wt Readings from Last 3 Encounters:  06/19/19 189 lb 4.8 oz (85.9 kg)  05/09/19 189 lb (85.7 kg)  04/23/19 190 lb (86.2 kg)    Diabetic Foot Exam - Simple   No data filed     Lab Results  Component Value Date   WBC 7.6 01/15/2019   HGB 14.9 01/15/2019   HCT 46.3 (H) 01/15/2019   PLT 381 01/15/2019   GLUCOSE 119 (  H) 01/15/2019   CHOL 230 (H) 02/15/2019   TRIG 171 (H) 02/15/2019   HDL 56 02/15/2019   LDLCALC 140 (H) 02/15/2019   ALT 36 01/15/2019   AST 27 01/15/2019   NA 138 01/15/2019   K 3.6 01/15/2019   CL 106 01/15/2019   CREATININE 0.52 01/15/2019   BUN 10 01/15/2019   CO2 20 (L) 01/15/2019   TSH 0.693 02/15/2019   HGBA1C 5.0 02/15/2019    Lab Results  Component Value Date   TSH 0.693 02/15/2019   Lab Results  Component Value Date   WBC 7.6 01/15/2019   HGB 14.9 01/15/2019   HCT 46.3 (H) 01/15/2019   MCV 95.9 01/15/2019   PLT 381  01/15/2019   Lab Results  Component Value Date   NA 138 01/15/2019   K 3.6 01/15/2019   CO2 20 (L) 01/15/2019   GLUCOSE 119 (H) 01/15/2019   BUN 10 01/15/2019   CREATININE 0.52 01/15/2019   BILITOT 0.6 01/15/2019   ALKPHOS 72 01/15/2019   AST 27 01/15/2019   ALT 36 01/15/2019   PROT 6.8 01/15/2019   ALBUMIN 3.8 01/15/2019   CALCIUM 9.1 01/15/2019   ANIONGAP 12 01/15/2019   GFR 119.54 06/29/2017   Lab Results  Component Value Date   CHOL 230 (H) 02/15/2019   Lab Results  Component Value Date   HDL 56 02/15/2019   Lab Results  Component Value Date   LDLCALC 140 (H) 02/15/2019   Lab Results  Component Value Date   TRIG 171 (H) 02/15/2019   Lab Results  Component Value Date   CHOLHDL 3.1 12/13/2017   Lab Results  Component Value Date   HGBA1C 5.0 02/15/2019       Assessment & Plan:   Problem List Items Addressed This Visit    Vitamin D deficiency - Primary   Relevant Orders   VITAMIN D   Hypothyroidism    On Levothyroxine, continue to monitor      Anxiety and depression    Patient is doing OK at present but continues to struggle with several family concerns. She is in the process of starting counseling and she will let us know if we need to alter medications.       Breast cancer screening    MGM ordered today      Relevant Orders   MAMMOGRAM TOMO 3-D (HP)   Grief reaction   Hyperglycemia    hgba1c acceptable, minimize simple carbs. Increase exercise as tolerated.       Relevant Orders   A1C   Hyperlipidemia    Encouraged heart healthy diet, increase exercise, avoid trans fats, consider a krill oil cap daily      Relevant Orders   Lipid panel   Essential hypertension    Well controlled, no changes to meds. Encouraged heart healthy diet such as the DASH diet and exercise as tolerated.       Relevant Orders   CBC   TSH   CMP   Class 1 obesity with serious comorbidity and body mass index (BMI) of 33.0 to 33.9 in adult    Has continued to  loose weight slowly and is no longer in an organized platform. Encouraged heart healthy diet         I have discontinued Katie Henry's methylPREDNISolone. I am also having her maintain her fluticasone, cetirizine, acetaminophen, valACYclovir, Synthroid, ALPRAZolam, hyoscyamine, meclizine, promethazine, cholecalciferol, ELDERBERRY PO, multivitamin with minerals, Vitamin D (Ergocalciferol), lisinopril, and tiZANidine.  No orders  of the defined types were placed in this encounter.    Penni Homans, MD

## 2019-06-19 NOTE — Assessment & Plan Note (Signed)
Well controlled, no changes to meds. Encouraged heart healthy diet such as the DASH diet and exercise as tolerated.  °

## 2019-06-19 NOTE — Patient Instructions (Signed)
Multivitamin with minerals (selenium) Vitamin 07-1998 IU daily ECASA 81 mg daily Melatonin 1-3 mg at bedtime Hypertension, Adult High blood pressure (hypertension) is when the force of blood pumping through the arteries is too strong. The arteries are the blood vessels that carry blood from the heart throughout the body. Hypertension forces the heart to work harder to pump blood and may cause arteries to become narrow or stiff. Untreated or uncontrolled hypertension can cause a heart attack, heart failure, a stroke, kidney disease, and other problems. A blood pressure reading consists of a higher number over a lower number. Ideally, your blood pressure should be below 120/80. The first ("top") number is called the systolic pressure. It is a measure of the pressure in your arteries as your heart beats. The second ("bottom") number is called the diastolic pressure. It is a measure of the pressure in your arteries as the heart relaxes. What are the causes? The exact cause of this condition is not known. There are some conditions that result in or are related to high blood pressure. What increases the risk? Some risk factors for high blood pressure are under your control. The following factors may make you more likely to develop this condition:  Smoking.  Having type 2 diabetes mellitus, high cholesterol, or both.  Not getting enough exercise or physical activity.  Being overweight.  Having too much fat, sugar, calories, or salt (sodium) in your diet.  Drinking too much alcohol. Some risk factors for high blood pressure may be difficult or impossible to change. Some of these factors include:  Having chronic kidney disease.  Having a family history of high blood pressure.  Age. Risk increases with age.  Race. You may be at higher risk if you are African American.  Gender. Men are at higher risk than women before age 28. After age 37, women are at higher risk than men.  Having obstructive  sleep apnea.  Stress. What are the signs or symptoms? High blood pressure may not cause symptoms. Very high blood pressure (hypertensive crisis) may cause:  Headache.  Anxiety.  Shortness of breath.  Nosebleed.  Nausea and vomiting.  Vision changes.  Severe chest pain.  Seizures. How is this diagnosed? This condition is diagnosed by measuring your blood pressure while you are seated, with your arm resting on a flat surface, your legs uncrossed, and your feet flat on the floor. The cuff of the blood pressure monitor will be placed directly against the skin of your upper arm at the level of your heart. It should be measured at least twice using the same arm. Certain conditions can cause a difference in blood pressure between your right and left arms. Certain factors can cause blood pressure readings to be lower or higher than normal for a short period of time:  When your blood pressure is higher when you are in a health care provider's office than when you are at home, this is called white coat hypertension. Most people with this condition do not need medicines.  When your blood pressure is higher at home than when you are in a health care provider's office, this is called masked hypertension. Most people with this condition may need medicines to control blood pressure. If you have a high blood pressure reading during one visit or you have normal blood pressure with other risk factors, you may be asked to:  Return on a different day to have your blood pressure checked again.  Monitor your blood pressure at home  for 1 week or longer. If you are diagnosed with hypertension, you may have other blood or imaging tests to help your health care provider understand your overall risk for other conditions. How is this treated? This condition is treated by making healthy lifestyle changes, such as eating healthy foods, exercising more, and reducing your alcohol intake. Your health care provider  may prescribe medicine if lifestyle changes are not enough to get your blood pressure under control, and if:  Your systolic blood pressure is above 130.  Your diastolic blood pressure is above 80. Your personal target blood pressure may vary depending on your medical conditions, your age, and other factors. Follow these instructions at home: Eating and drinking   Eat a diet that is high in fiber and potassium, and low in sodium, added sugar, and fat. An example eating plan is called the DASH (Dietary Approaches to Stop Hypertension) diet. To eat this way: ? Eat plenty of fresh fruits and vegetables. Try to fill one half of your plate at each meal with fruits and vegetables. ? Eat whole grains, such as whole-wheat pasta, brown rice, or whole-grain bread. Fill about one fourth of your plate with whole grains. ? Eat or drink low-fat dairy products, such as skim milk or low-fat yogurt. ? Avoid fatty cuts of meat, processed or cured meats, and poultry with skin. Fill about one fourth of your plate with lean proteins, such as fish, chicken without skin, beans, eggs, or tofu. ? Avoid pre-made and processed foods. These tend to be higher in sodium, added sugar, and fat.  Reduce your daily sodium intake. Most people with hypertension should eat less than 1,500 mg of sodium a day.  Do not drink alcohol if: ? Your health care provider tells you not to drink. ? You are pregnant, may be pregnant, or are planning to become pregnant.  If you drink alcohol: ? Limit how much you use to:  0-1 drink a day for women.  0-2 drinks a day for men. ? Be aware of how much alcohol is in your drink. In the [place] one drink equals one 12 oz bottle of beer (355 mL), one 5 oz glass of wine (148 mL), or one 1 oz glass of hard liquor (44 mL). Lifestyle   Work with your health care provider to maintain a healthy body weight or to lose weight. Ask what an ideal weight is for you.  Get at least 30 minutes of exercise  most days of the week. Activities may include walking, swimming, or biking.  Include exercise to strengthen your muscles (resistance exercise), such as Pilates or lifting weights, as part of your weekly exercise routine. Try to do these types of exercises for 30 minutes at least 3 days a week.  Do not use any products that contain nicotine or tobacco, such as cigarettes, e-cigarettes, and chewing tobacco. If you need help quitting, ask your health care provider.  Monitor your blood pressure at home as told by your health care provider.  Keep all follow-up visits as told by your health care provider. This is important. Medicines  Take over-the-counter and prescription medicines only as told by your health care provider. Follow directions carefully. Blood pressure medicines must be taken as prescribed.  Do not skip doses of blood pressure medicine. Doing this puts you at risk for problems and can make the medicine less effective.  Ask your health care provider about side effects or reactions to medicines that you should watch for. Contact  a health care provider if you:  Think you are having a reaction to a medicine you are taking.  Have headaches that keep coming back (recurring).  Feel dizzy.  Have swelling in your ankles.  Have trouble with your vision. Get help right away if you:  Develop a severe headache or confusion.  Have unusual weakness or numbness.  Feel faint.  Have severe pain in your chest or abdomen.  Vomit repeatedly.  Have trouble breathing. Summary  Hypertension is when the force of blood pumping through your arteries is too strong. If this condition is not controlled, it may put you at risk for serious complications.  Your personal target blood pressure may vary depending on your medical conditions, your age, and other factors. For most people, a normal blood pressure is less than 120/80.  Hypertension is treated with lifestyle changes, medicines, or a  combination of both. Lifestyle changes include losing weight, eating a healthy, low-sodium diet, exercising more, and limiting alcohol. This information is not intended to replace advice given to you by your health care provider. Make sure you discuss any questions you have with your health care provider. Document Released: 07/05/2005 Document Revised: 03/15/2018 Document Reviewed: 03/15/2018 Elsevier Patient Education  2020 [employer].

## 2019-06-19 NOTE — Assessment & Plan Note (Signed)
Encouraged heart healthy diet, increase exercise, avoid trans fats, consider a krill oil cap daily 

## 2019-06-19 NOTE — Assessment & Plan Note (Signed)
Has continued to loose weight slowly and is no longer in an organized platform. Encouraged heart healthy diet

## 2019-07-24 ENCOUNTER — Encounter: Payer: Self-pay | Admitting: Family Medicine

## 2019-07-24 ENCOUNTER — Other Ambulatory Visit (INDEPENDENT_AMBULATORY_CARE_PROVIDER_SITE_OTHER): Payer: No Typology Code available for payment source

## 2019-07-24 DIAGNOSIS — I10 Essential (primary) hypertension: Secondary | ICD-10-CM

## 2019-07-24 DIAGNOSIS — E559 Vitamin D deficiency, unspecified: Secondary | ICD-10-CM

## 2019-07-24 DIAGNOSIS — E785 Hyperlipidemia, unspecified: Secondary | ICD-10-CM | POA: Diagnosis not present

## 2019-07-24 DIAGNOSIS — R739 Hyperglycemia, unspecified: Secondary | ICD-10-CM

## 2019-07-24 LAB — COMPREHENSIVE METABOLIC PANEL
ALT: 18 U/L (ref 0–35)
AST: 14 U/L (ref 0–37)
Albumin: 4.1 g/dL (ref 3.5–5.2)
Alkaline Phosphatase: 69 U/L (ref 39–117)
BUN: 9 mg/dL (ref 6–23)
CO2: 22 mEq/L (ref 19–32)
Calcium: 9.5 mg/dL (ref 8.4–10.5)
Chloride: 104 mEq/L (ref 96–112)
Creatinine, Ser: 0.63 mg/dL (ref 0.40–1.20)
GFR: 100.63 mL/min (ref >60.00)
Glucose, Bld: 106 mg/dL — ABNORMAL HIGH (ref 70–99)
Potassium: 4.3 mEq/L (ref 3.5–5.1)
Sodium: 136 mEq/L (ref 135–145)
Total Bilirubin: 0.6 mg/dL (ref 0.2–1.2)
Total Protein: 6.7 g/dL (ref 6.0–8.3)

## 2019-07-24 LAB — LIPID PANEL
Cholesterol: 246 mg/dL — ABNORMAL HIGH (ref 0–200)
HDL: 54.6 mg/dL (ref >39.00)
NonHDL: 190.93
Total CHOL/HDL Ratio: 4
Triglycerides: 252 mg/dL — ABNORMAL HIGH (ref 0.0–149.0)
VLDL: 50.4 mg/dL — ABNORMAL HIGH (ref 0.0–40.0)

## 2019-07-24 LAB — CBC
HCT: 43.3 % (ref 36.0–46.0)
Hemoglobin: 14.7 g/dL (ref 12.0–15.0)
MCHC: 33.9 g/dL (ref 30.0–36.0)
MCV: 93.2 fl (ref 78.0–100.0)
Platelets: 402 10*3/uL — ABNORMAL HIGH (ref 150.0–400.0)
RBC: 4.65 Mil/uL (ref 3.87–5.11)
RDW: 12.6 % (ref 11.5–15.5)
WBC: 9.7 10*3/uL (ref 4.0–10.5)

## 2019-07-24 LAB — LDL CHOLESTEROL, DIRECT: Direct LDL: 167 mg/dL

## 2019-07-24 LAB — TSH: TSH: 2.13 u[IU]/mL (ref 0.35–4.50)

## 2019-07-24 LAB — HEMOGLOBIN A1C: Hgb A1c MFr Bld: 4.8 % (ref 4.6–6.5)

## 2019-07-24 LAB — VITAMIN D 25 HYDROXY (VIT D DEFICIENCY, FRACTURES): VITD: 24.32 ng/mL — ABNORMAL LOW (ref 30.00–100.00)

## 2019-07-24 NOTE — Addendum Note (Signed)
Addended by: Kelle Darting A on: 07/24/2019 08:12 AM   Modules accepted: Orders

## 2019-07-25 ENCOUNTER — Other Ambulatory Visit: Payer: Self-pay | Admitting: Family Medicine

## 2019-07-25 MED ORDER — VITAMIN D (ERGOCALCIFEROL) 1.25 MG (50000 UNIT) PO CAPS: 50000.0000 [IU] | ORAL_CAPSULE | ORAL | 4 refills | Status: AC

## 2019-07-25 MED ORDER — SYNTHROID 175 MCG PO TABS: ORAL_TABLET | ORAL | 3 refills | Status: DC

## 2019-07-25 MED FILL — SYNTHROID 175 MCG TABLET: 175 | 30 days supply | Qty: 30 | Fill #0

## 2019-07-25 MED FILL — VIT D2 1.25 MG (50,000 UNIT: 1.25 MG | 28 days supply | Qty: 4 | Fill #0

## 2019-08-10 ENCOUNTER — Encounter: Payer: Self-pay | Admitting: Family Medicine

## 2019-08-10 MED ORDER — VALACYCLOVIR HCL 1 G PO TABS: 1000.0000 mg | ORAL_TABLET | Freq: Two times a day (BID) | ORAL | 0 refills | Status: DC

## 2019-08-10 MED FILL — valACYclovir HCL 1 GM TABS: 1 | 30 days supply | Qty: 60 | Fill #0

## 2019-08-29 MED FILL — SYNTHROID 175 MCG TABLET: 175 | 30 days supply | Qty: 30 | Fill #1

## 2019-08-29 MED FILL — VIT D2 1.25 MG (50,000 UNIT: 1.25 MG | 28 days supply | Qty: 4 | Fill #1

## 2019-09-25 ENCOUNTER — Ambulatory Visit (INDEPENDENT_AMBULATORY_CARE_PROVIDER_SITE_OTHER): Payer: No Typology Code available for payment source | Admitting: Family Medicine

## 2019-09-25 ENCOUNTER — Other Ambulatory Visit: Payer: Self-pay

## 2019-09-25 VITALS — BP 123/77 | HR 82 | Temp 96.2°F | Resp 12 | Wt 190.2 lb

## 2019-09-25 DIAGNOSIS — M25561 Pain in right knee: Secondary | ICD-10-CM | POA: Diagnosis not present

## 2019-09-25 DIAGNOSIS — I10 Essential (primary) hypertension: Secondary | ICD-10-CM | POA: Diagnosis not present

## 2019-09-25 DIAGNOSIS — M79661 Pain in right lower leg: Secondary | ICD-10-CM | POA: Diagnosis not present

## 2019-09-25 MED ORDER — METHYLPREDNISOLONE 4 MG PO TABS: ORAL_TABLET | ORAL | 0 refills | Status: DC

## 2019-09-25 MED ORDER — TIZANIDINE HCL 2 MG PO TABS: 1.0000 mg | ORAL_TABLET | Freq: Two times a day (BID) | ORAL | 0 refills | Status: DC | PRN

## 2019-09-25 MED FILL — tiZANidine HCL 2 MG TABS: 2 | 7 days supply | Qty: 30 | Fill #0

## 2019-09-25 MED FILL — METHYLPREDNISOLONE 4 MG TAB: 4 | 5 days supply | Qty: 15 | Fill #0

## 2019-09-25 NOTE — Patient Instructions (Signed)
Tylenol/Acetaminophen ES 500 mg twice daily When completed the Medrol add Advil/Motrin/Ibuprofen 200 mg tabs 1 tab twice daily Acute Knee Pain, Adult Acute knee pain is sudden and may be caused by damage, swelling, or irritation of the muscles and tissues that support your knee. The injury may result from:  A fall.  An injury to your knee from twisting motions.  A hit to the knee.  Infection. Acute knee pain may go away on its own with time and rest. If it does not, your health care provider may order tests to find the cause of the pain. These may include:  Imaging tests, such as an X-ray, MRI, or ultrasound.  Joint aspiration. In this test, fluid is removed from the knee.  Arthroscopy. In this test, a lighted tube is inserted into the knee and an image is projected onto a TV screen.  Biopsy. In this test, a sample of tissue is removed from the body and studied under a microscope. Follow these instructions at home: Pay attention to any changes in your symptoms. Take these actions to relieve your pain. If you have a knee sleeve or brace:   Wear the sleeve or brace as told by your health care provider. Remove it only as told by your health care provider.  Loosen the sleeve or brace if your toes tingle, become numb, or turn cold and blue.  Keep the sleeve or brace clean.  If the sleeve or brace is not waterproof: ? Do not let it get wet. ? Cover it with a watertight covering when you take a bath or shower. Activity  Rest your knee.  Do not do things that cause pain or make pain worse.  Avoid high-impact activities or exercises, such as running, jumping rope, or doing jumping jacks.  Work with a physical therapist to make a safe exercise program, as recommended by your health care provider. Do exercises as told by your physical therapist. Managing pain, stiffness, and swelling   If directed, put ice on the knee: ? Put ice in a plastic bag. ? Place a towel between your  skin and the bag. ? Leave the ice on for 20 minutes, 2-3 times a day.  If directed, use an elastic bandage to put pressure (compression) on your injured knee. This may control swelling, give support, and help with discomfort. General instructions  Take over-the-counter and prescription medicines only as told by your health care provider.  Raise (elevate) your knee above the level of your heart when you are sitting or lying down.  Sleep with a pillow under your knee.  Do not use any products that contain nicotine or tobacco, such as cigarettes, e-cigarettes, and chewing tobacco. These can delay healing. If you need help quitting, ask your health care provider.  If you are overweight, work with your health care provider and a dietitian to set a weight-loss goal that is healthy and reasonable for you. Extra weight can put pressure on your knee.  Keep all follow-up visits as told by your health care provider. This is important. Contact a health care provider if:  Your knee pain continues, changes, or gets worse.  You have a fever along with knee pain.  Your knee feels warm to the touch.  Your knee buckles or locks up. Get help right away if:  Your knee swells, and the swelling becomes worse.  You cannot move your knee.  You have severe pain in your knee. Summary  Acute knee pain can be caused  by a fall, an injury, an infection, or damage, swelling, or irritation of the tissues that support your knee.  Your health care provider may perform tests to find out the cause of the pain.  Pay attention to any changes in your symptoms. Relieve your pain with rest, medicines, light activity, and use of ice.  Get help if your pain continues or becomes worse, your knee swells, or you cannot move your knee. This information is not intended to replace advice given to you by your health care provider. Make sure you discuss any questions you have with your health care provider. Document  Revised: 12/15/2017 Document Reviewed: 12/15/2017 Elsevier Patient Education  [place].  Hip Pain The hip is the joint between the upper legs and the lower pelvis. The bones, cartilage, tendons, and muscles of your hip joint support your body and allow you to move around. Hip pain can range from a minor ache to severe pain in one or both of your hips. The pain may be felt on the inside of the hip joint near the groin, or on the outside near the buttocks and upper thigh. You may also have swelling or stiffness in your hip area. Follow these instructions at home: Managing pain, stiffness, and swelling      If directed, put ice on the painful area. To do this: ? Put ice in a plastic bag. ? Place a towel between your skin and the bag. ? Leave the ice on for 20 minutes, 2-3 times a day.  If directed, apply heat to the affected area as often as told by your health care provider. Use the heat source that your health care provider recommends, such as a moist heat pack or a heating pad. ? Place a towel between your skin and the heat source. ? Leave the heat on for 20-30 minutes. ? Remove the heat if your skin turns bright red. This is especially important if you are unable to feel pain, heat, or cold. You may have a greater risk of getting burned. Activity  Do exercises as told by your health care provider.  Avoid activities that cause pain. General instructions   Take over-the-counter and prescription medicines only as told by your health care provider.  Keep a journal of your symptoms. Write down: ? How often you have hip pain. ? The location of your pain. ? What the pain feels like. ? What makes the pain worse.  Sleep with a pillow between your legs on your most comfortable side.  Keep all follow-up visits as told by your health care provider. This is important. Contact a health care provider if:  You cannot put weight on your leg.  Your pain or swelling continues or  gets worse after one week.  It gets harder to walk.  You have a fever. Get help right away if:  You fall.  You have a sudden increase in pain and swelling in your hip.  Your hip is red or swollen or very tender to touch. Summary  Hip pain can range from a minor ache to severe pain in one or both of your hips.  The pain may be felt on the inside of the hip joint near the groin, or on the outside near the buttocks and upper thigh.  Avoid activities that cause pain.  Write down how often you have hip pain, the location of the pain, what makes it worse, and what it feels like. This information is not intended to  replace advice given to you by your health care provider. Make sure you discuss any questions you have with your health care provider. Document Revised: 11/20/2018 Document Reviewed: 11/20/2018 Elsevier Patient Education  [place].

## 2019-09-26 DIAGNOSIS — M25561 Pain in right knee: Secondary | ICD-10-CM | POA: Insufficient documentation

## 2019-09-26 NOTE — Assessment & Plan Note (Addendum)
Swelling and majority of pain is in posterior knee. Some pain noted into top of posterior calf as well. Will proceed with ultrasound to rule out DVT. Encouraged moist heat and gentle stretching as tolerated. May try NSAIDs once Medrol dosepak is completed and prescription meds as directed and report if symptoms worsen or seek immediate care. Also can use TIzanidine prn

## 2019-09-26 NOTE — Progress Notes (Signed)
Subjective:    Patient ID: Katie Henry, female    DOB: 02-Oct-1970, 49 y.o.   MRN: [id number]  Chief Complaint  Patient presents with  . Knee Pain    right    HPI Patient is in today for evaluation of right knee/calf pain and swelling. No fall or trauma. No redness or warmth. No other acute concerns or recent febrile illness. Denies CP/palp/SOB/HA/congestion/fevers/GI or GU c/o. Taking meds as prescribed  Past Medical History:  Diagnosis Date  . Anxiety   . Back pain   . Dental infection 05/28/2011  . Endometriosis   . Fainting episodes    Dr Luther Parody related  . Graves disease   . Grief reaction 11/15/2016  . History of kidney stones 2003   onset with pregnacy   . History of UTI    last 4-5-weeks ago  . Hyperglycemia 11/15/2016   cbg 100  . Hyperthyroidism   . 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 IBS (irritable bowel syndrome)   . Migraine   . Syncope   . Vitamin D deficiency     Past Surgical History:  Procedure Laterality Date  .  treatment  1998  . APPENDECTOMY  1991  . CYSTOSCOPY/URETEROSCOPY/HOLMIUM LASER/STENT PLACEMENT Left 08/09/2017   Procedure: CYSTOSCOPY/RETROGRADE/URETEROSCOPY/HOLMIUM LASER/STENT PLACEMENT;  Surgeon: Festus Aloe, MD;  Location: [facility];  Service: Urology;  Laterality: Left;  ONLY NEEDS 60 MIN  . ENDOMETRIAL ABLATION  01/2007  . EXTRACORPOREAL SHOCK WAVE LITHOTRIPSY Left 07/21/2017   Procedure: LEFT EXTRACORPOREAL SHOCK WAVE LITHOTRIPSY (ESWL);  Surgeon: Bjorn Loser, MD;  Location: [facility];  Service: Urology;  Laterality: Left;  Marland Kitchen VAGINAL HYSTERECTOMY  2010   ovaries still in place    Family History  Problem Relation Age of Onset  . Hyperlipidemia Mother   . Heart disease Mother        bicuspid mitral valve  . Hyperlipidemia Maternal Grandmother   . Diabetes Maternal Grandmother   . Kidney disease Maternal Grandmother   . Hyperlipidemia Maternal Grandfather   . Heart disease Maternal Grandfather 28        MI  . Prostate cancer Maternal Grandfather   . Hypertension Father   . Dementia Father   . Seizures Father        s/p TBI  . Hypertension Paternal Grandfather   . Graves' disease Sister   . Obesity Brother   . Prostate cancer Other        maternal and paternal grandparents  . Heart attack Maternal Aunt        deceased age 74  . Heart disease Maternal Aunt   . Colon cancer Neg Hx   . Esophageal cancer Neg Hx     Social History   Socioeconomic History  . Marital status: Married    Spouse name: Kylar Fahl  . Number of children: 2  . Years of education: 55  . Highest education level: Not on file  Occupational History    Employer: [employer]  Tobacco Use  . Smoking status: Former Smoker    Packs/day: 0.25    Years: 6.00    Pack years: 1.50    Types: Cigarettes  . Smokeless tobacco: Never Used  . Tobacco comment: quit 2010  Substance and Sexual Activity  . Alcohol use: Yes    Comment: Ocassionally  . Drug use: No  . Sexual activity: Yes    Partners: Male    Comment: work at [employer], lives with fiance and son  Other Topics Concern  . Not on file  Social History Narrative   Engaged, two sons.   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   No Tob, occ alcohol, no drugs.   Social Determinants of Health   Financial Resource Strain:   . Difficulty of Paying Living Expenses: Not on file  Food Insecurity:   . Worried About Charity fundraiser in the Last Year: Not on file  . Ran Out of Food in the Last Year: Not on file  Transportation Needs:   . Lack of Transportation (Medical): Not on file  . Lack of Transportation (Non-Medical): Not on file  Physical Activity:   . Days of Exercise per Week: Not on file  . Minutes of Exercise per Session: Not on file  Stress:   . Feeling of Stress : Not on file  Social Connections:   . Frequency of Communication with Friends and Family: Not on file  . Frequency of Social Gatherings with  Friends and Family: Not on file  . Attends Religious Services: Not on file  . Active Member of Clubs or Organizations: Not on file  . Attends Archivist Meetings: Not on file  . Marital Status: Not on file  Intimate Partner Violence:   . Fear of Current or Ex-Partner: Not on file  . Emotionally Abused: Not on file  . Physically Abused: Not on file  . Sexually Abused: Not on file    Outpatient Medications Prior to Visit  Medication Sig Dispense Refill  . acetaminophen (TYLENOL) 500 MG tablet Take 1,000 mg by mouth every 6 (six) hours as needed for mild pain or moderate pain.    Marland Kitchen ALPRAZolam (XANAX) 0.25 MG tablet Take 1 tablet (0.25 mg total) by mouth 2 (two) times daily as needed for anxiety or sleep. 30 tablet 1  . cetirizine (ZYRTEC) 10 MG tablet Take 1 tablet (10 mg total) by mouth daily. (Patient taking differently: Take 10 mg by mouth daily as needed for allergies. ) 30 tablet 0  . cholecalciferol (VITAMIN D3) 25 MCG (1000 UT) tablet Take 5,000 Units by mouth daily.    Marland Kitchen ELDERBERRY PO Take by mouth daily.    . fluticasone (FLONASE) 50 MCG/ACT nasal spray Place 2 sprays into both nostrils daily. 16 g 6  . hyoscyamine (LEVSIN SL) 0.125 MG SL tablet Place 1 tablet (0.125 mg total) under the tongue every 4 (four) hours as needed. 30 tablet 0  . lisinopril (ZESTRIL) 5 MG tablet Take 1 tablet (5 mg total) by mouth daily. 30 tablet 0  . meclizine (ANTIVERT) 25 MG tablet Take 1 tablet (25 mg total) by mouth 3 (three) times daily as needed for dizziness. 30 tablet 0  . Multiple Vitamins-Minerals (MULTIVITAMIN WITH MINERALS) tablet Take 1 tablet by mouth daily.    . promethazine (PHENERGAN) 25 MG tablet Take 1 tablet (25 mg total) by mouth every 6 (six) hours as needed for nausea or vomiting. 30 tablet 0  . SYNTHROID 175 MCG tablet TAKE 1 TABLET BY MOUTH DAILY BEFORE BREAKFAST 90 tablet 3  . valACYclovir (VALTREX) 1000 MG tablet Take 1 tablet (1,000 mg total) by mouth 2 (two) times  daily. 60 tablet 0  . Vitamin D, Ergocalciferol, (DRISDOL) 1.25 MG (50000 UT) CAPS capsule Take 1 capsule (50,000 Units total) by mouth every 7 (seven) days. 4 capsule 4  . tiZANidine (ZANAFLEX) 2 MG tablet Take 0.5-2 tablets (1-4 mg total) by mouth 2 (two) times  daily as needed for muscle spasms. 30 tablet 0   No facility-administered medications prior to visit.    Allergies  Allergen Reactions  . Zofran [Ondansetron Hcl] Other (See Comments)    Pt has hx of migraines; medication increases headaches and nausea   . Amoxicillin Nausea And Vomiting and Other (See Comments)    Has patient had a PCN reaction causing immediate rash, facial/tongue/throat swelling, SOB or lightheadedness with hypotension: Unknown Has patient had a PCN reaction causing severe rash involving mucus membranes or skin necrosis: No Has patient had a PCN reaction that required hospitalization: No Has patient had a PCN reaction occurring within the last 10 years: No If all of the above answers are "NO", then may proceed with Cephalosporin use.   . Codeine Nausea And Vomiting  . Imitrex [Sumatriptan] Nausea And Vomiting  . Ultram [Tramadol Hcl] Nausea And Vomiting    Review of Systems  Constitutional: Negative for fever and malaise/fatigue.  HENT: Negative for congestion.   Eyes: Negative for blurred vision.  Respiratory: Negative for shortness of breath.   Cardiovascular: Negative for chest pain, palpitations and leg swelling.  Gastrointestinal: Negative for abdominal pain, blood in stool and nausea.  Genitourinary: Negative for dysuria and frequency.  Musculoskeletal: Positive for joint pain and myalgias. Negative for falls.  Skin: Negative for rash.  Neurological: Negative for dizziness, loss of consciousness and headaches.  Endo/Heme/Allergies: Negative for environmental allergies.  Psychiatric/Behavioral: Negative for depression. The patient is not nervous/anxious.        Objective:    Physical  Exam Vitals and nursing note reviewed.  Constitutional:      General: She is not in acute distress.    Appearance: She is well-developed.  HENT:     Head: Normocephalic and atraumatic.     Nose: Nose normal.  Eyes:     General:        Right eye: No discharge.        Left eye: No discharge.  Cardiovascular:     Rate and Rhythm: Normal rate and regular rhythm.     Heart sounds: No murmur.  Pulmonary:     Effort: Pulmonary effort is normal.     Breath sounds: Normal breath sounds.  Abdominal:     General: Bowel sounds are normal.     Palpations: Abdomen is soft.     Tenderness: There is no abdominal tenderness.  Musculoskeletal:     Cervical back: Normal range of motion and neck supple.     Comments: Negative Homan's right leg. Swelling noted posterior right knee.   Skin:    General: Skin is warm and dry.  Neurological:     Mental Status: She is alert and oriented to person, place, and time.     BP 123/77 (BP Location: Right Arm, Cuff Size: Normal)   Pulse 82   Temp (!) 96.2 F (35.7 C) (Temporal)   Resp 12   Wt 190 lb 3.2 oz (86.3 kg)   SpO2 100%   BMI 33.69 kg/m  Wt Readings from Last 3 Encounters:  09/25/19 190 lb 3.2 oz (86.3 kg)  06/19/19 189 lb 4.8 oz (85.9 kg)  05/09/19 189 lb (85.7 kg)    Diabetic Foot Exam - Simple   No data filed     Lab Results  Component Value Date   WBC 9.7 07/24/2019   HGB 14.7 07/24/2019   HCT 43.3 07/24/2019   PLT 402.0 (H) 07/24/2019   GLUCOSE 106 (H) 07/24/2019   CHOL  246 (H) 07/24/2019   TRIG 252.0 (H) 07/24/2019   HDL 54.60 07/24/2019   LDLDIRECT 167.0 07/24/2019   LDLCALC 140 (H) 02/15/2019   ALT 18 07/24/2019   AST 14 07/24/2019   NA 136 07/24/2019   K 4.3 07/24/2019   CL 104 07/24/2019   CREATININE 0.63 07/24/2019   BUN 9 07/24/2019   CO2 22 07/24/2019   TSH 2.13 07/24/2019   HGBA1C 4.8 07/24/2019    Lab Results  Component Value Date   TSH 2.13 07/24/2019   Lab Results  Component Value Date   WBC 9.7  07/24/2019   HGB 14.7 07/24/2019   HCT 43.3 07/24/2019   MCV 93.2 07/24/2019   PLT 402.0 (H) 07/24/2019   Lab Results  Component Value Date   NA 136 07/24/2019   K 4.3 07/24/2019   CO2 22 07/24/2019   GLUCOSE 106 (H) 07/24/2019   BUN 9 07/24/2019   CREATININE 0.63 07/24/2019   BILITOT 0.6 07/24/2019   ALKPHOS 69 07/24/2019   AST 14 07/24/2019   ALT 18 07/24/2019   PROT 6.7 07/24/2019   ALBUMIN 4.1 07/24/2019   CALCIUM 9.5 07/24/2019   ANIONGAP 12 01/15/2019   GFR 100.63 07/24/2019   Lab Results  Component Value Date   CHOL 246 (H) 07/24/2019   Lab Results  Component Value Date   HDL 54.60 07/24/2019   Lab Results  Component Value Date   LDLCALC 140 (H) 02/15/2019   Lab Results  Component Value Date   TRIG 252.0 (H) 07/24/2019   Lab Results  Component Value Date   CHOLHDL 4 07/24/2019   Lab Results  Component Value Date   HGBA1C 4.8 07/24/2019       Assessment & Plan:   Problem List Items Addressed This Visit    Essential hypertension    Well controlled, no changes to meds. Encouraged heart healthy diet such as the DASH diet and exercise as tolerated.       Acute pain of right knee    Swelling and majority of pain is in posterior knee. Some pain noted into top of posterior calf as well. Will proceed with ultrasound to rule out DVT. Encouraged moist heat and gentle stretching as tolerated. May try NSAIDs once Medrol dosepak is completed and prescription meds as directed and report if symptoms worsen or seek immediate care. Also can use TIzanidine prn      Relevant Orders   US Venous Img Lower Unilateral Right (DVT)    Other Visit Diagnoses    Right calf pain    -  Primary   Relevant Orders   US Venous Img Lower Unilateral Right (DVT)      I am having Katie Henry start on methylPREDNISolone. I am also having her maintain her fluticasone, cetirizine, acetaminophen, ALPRAZolam, hyoscyamine, meclizine, promethazine, cholecalciferol, ELDERBERRY PO,  multivitamin with minerals, lisinopril, Synthroid, Vitamin D (Ergocalciferol), valACYclovir, and tiZANidine.  Meds ordered this encounter  Medications  . tiZANidine (ZANAFLEX) 2 MG tablet    Sig: Take 0.5-2 tablets (1-4 mg total) by mouth 2 (two) times daily as needed for muscle spasms.    Dispense:  30 tablet    Refill:  0  . methylPREDNISolone (MEDROL) 4 MG tablet    Sig: 5 tab po qd X 1d then 4 tab po qd X 1d then 3 tab po qd X 1d then 2 tab po qd then 1 tab po qd    Dispense:  15 tablet    Refill:  0     Penni Homans, MD

## 2019-09-26 NOTE — Assessment & Plan Note (Signed)
Well controlled, no changes to meds. Encouraged heart healthy diet such as the DASH diet and exercise as tolerated.  °

## 2019-09-27 ENCOUNTER — Ambulatory Visit (HOSPITAL_BASED_OUTPATIENT_CLINIC_OR_DEPARTMENT_OTHER)
Admission: RE | Admit: 2019-09-27 | Discharge: 2019-09-27 | Disposition: A | Discharge status: Home / Self Care | Payer: No Typology Code available for payment source | Source: Ambulatory Visit | Attending: Family Medicine | Admitting: Family Medicine

## 2019-09-27 ENCOUNTER — Other Ambulatory Visit: Payer: Self-pay

## 2019-09-27 DIAGNOSIS — M79661 Pain in right lower leg: Secondary | ICD-10-CM | POA: Insufficient documentation

## 2019-09-27 DIAGNOSIS — M25561 Pain in right knee: Secondary | ICD-10-CM | POA: Insufficient documentation

## 2019-09-28 ENCOUNTER — Ambulatory Visit (HOSPITAL_BASED_OUTPATIENT_CLINIC_OR_DEPARTMENT_OTHER): Payer: No Typology Code available for payment source

## 2019-09-28 ENCOUNTER — Encounter: Payer: Self-pay | Admitting: Family Medicine

## 2019-10-12 MED FILL — SYNTHROID 175 MCG TABLET: 175 | 30 days supply | Qty: 30 | Fill #2

## 2019-11-01 ENCOUNTER — Encounter: Payer: Self-pay | Admitting: Family Medicine

## 2019-11-01 ENCOUNTER — Other Ambulatory Visit: Payer: Self-pay | Admitting: Family Medicine

## 2019-11-01 DIAGNOSIS — M25561 Pain in right knee: Secondary | ICD-10-CM

## 2019-11-07 ENCOUNTER — Ambulatory Visit: Payer: No Typology Code available for payment source | Attending: Family Medicine | Admitting: Physical Therapy

## 2019-11-07 ENCOUNTER — Other Ambulatory Visit: Payer: Self-pay

## 2019-11-07 DIAGNOSIS — R262 Difficulty in walking, not elsewhere classified: Secondary | ICD-10-CM | POA: Insufficient documentation

## 2019-11-07 DIAGNOSIS — R29898 Other symptoms and signs involving the musculoskeletal system: Secondary | ICD-10-CM | POA: Diagnosis present

## 2019-11-07 DIAGNOSIS — M25561 Pain in right knee: Secondary | ICD-10-CM | POA: Insufficient documentation

## 2019-11-07 DIAGNOSIS — M6281 Muscle weakness (generalized): Secondary | ICD-10-CM | POA: Diagnosis present

## 2019-11-07 DIAGNOSIS — M25661 Stiffness of right knee, not elsewhere classified: Secondary | ICD-10-CM | POA: Diagnosis present

## 2019-11-07 NOTE — Patient Instructions (Signed)
     Supine & seated/standing alternatives provided for all stretches.  Home exercise program created by Annie Paras, PT.  For questions, please contact JoAnne via phone at [phone] or email at [email or web address]  [facility] [address] [place], [place], [address] Phone: [phone]   Fax:  [phone]   Trigger Point Dry Needling  . What is Trigger Point Dry Needling (DN)? o DN is a physical therapy technique used to treat muscle pain and dysfunction. Specifically, DN helps deactivate muscle trigger points (muscle knots).  o A thin filiform needle is used to penetrate the skin and stimulate the underlying trigger point. The goal is for a local twitch response (LTR) to occur and for the trigger point to relax. No medication of any kind is injected during the procedure.   . What Does Trigger Point Dry Needling Feel Like?  o The procedure feels different for each individual patient. Some patients report that they do not actually feel the needle enter the skin and overall the process is not painful. Very mild bleeding may occur. However, many patients feel a deep cramping in the muscle in which the needle was inserted. This is the local twitch response.   Marland Kitchen How Will I feel after the treatment? o Soreness is normal, and the onset of soreness may not occur for a few hours. Typically this soreness does not last longer than two days.  o Bruising is uncommon, however; ice can be used to decrease any possible bruising.  o In rare cases feeling tired or nauseous after the treatment is normal. In addition, your symptoms may get worse before they get better, this period will typically not last longer than 24 hours.   . What Can I do After My Treatment? o Increase your hydration by drinking more water for the next 24 hours. o You may place ice or heat on the areas treated that have become sore, however, do not use  heat on inflamed or bruised areas. Heat often brings more relief post needling. o You can continue your regular activities, but vigorous activity is not recommended initially after the treatment for 24 hours. o DN is best combined with other physical therapy such as strengthening, stretching, and other therapies.

## 2019-11-07 NOTE — Therapy (Signed)
[facility] [address]  [place], [place], [address] Phone: [phone]   Fax:  [phone]  Physical Therapy Evaluation  Patient Details  Name: Katie Henry MRN: [id number] Date of Birth: 08/20/1970 Referring Provider (PT): Penni Homans, MD   Encounter Date: 11/07/2019  PT End of Session - 11/07/19 1054    Visit Number  1    Number of Visits  12    Date for PT Re-Evaluation  12/19/19    Authorization Type  [facility]    PT Start Time  1054    PT Stop Time  1158    PT Time Calculation (min)  64 min    Activity Tolerance  Patient tolerated treatment well    Behavior During Therapy  [facility] for tasks assessed/performed       Past Medical History:  Diagnosis Date  . Anxiety   . Back pain   . Dental infection 05/28/2011  . Endometriosis   . Fainting episodes    Dr Luther Parody related  . Graves disease   . Grief reaction 11/15/2016  . History of kidney stones 2003   onset with pregnacy   . History of UTI    last 4-5-weeks ago  . Hyperglycemia 11/15/2016   cbg 100  . Hyperthyroidism   . 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 IBS (irritable bowel syndrome)   . Migraine   . Syncope   . Vitamin D deficiency     Past Surgical History:  Procedure Laterality Date  .  treatment  1998  . APPENDECTOMY  1991  . CYSTOSCOPY/URETEROSCOPY/HOLMIUM LASER/STENT PLACEMENT Left 08/09/2017   Procedure: CYSTOSCOPY/RETROGRADE/URETEROSCOPY/HOLMIUM LASER/STENT PLACEMENT;  Surgeon: Festus Aloe, MD;  Location: [facility];  Service: Urology;  Laterality: Left;  ONLY NEEDS 60 MIN  . ENDOMETRIAL ABLATION  01/2007  . EXTRACORPOREAL SHOCK WAVE LITHOTRIPSY Left 07/21/2017   Procedure: LEFT EXTRACORPOREAL SHOCK WAVE LITHOTRIPSY (ESWL);  Surgeon: Bjorn Loser, MD;  Location: [facility];  Service: Urology;  Laterality: Left;  Marland Kitchen VAGINAL HYSTERECTOMY  2010   ovaries still in place    There were no vitals filed for this  visit.   Subjective Assessment - 11/07/19 1057    Subjective  Pt reports 3-4 month h/o of progressively worsening R knee/LE pain with uncertain MOI. Does recall a misstep coming down the stairs a few months back - not sure if she twisted her knee or something or if this is even related to her current pain. Pain initially with end range flexion such as when tucking her feet back under a chair, squatting or kneeling to pray but now also painful with walking. States she will walk with husband's knee brace sometimes - feels less pressure.    Limitations  Sitting;Standing;Walking;House hold activities    How long can you sit comfortably?  15-20 minutes    How long can you stand comfortably?  10 minutes    How long can you walk comfortably?  10 minutes    Diagnostic tests  [place] negative for DVT or Baker's cyst    Patient Stated Goals  "be able to walk w/o knee pain & to be able to squat and pray w/o pain"    Currently in Pain?  No/denies    Pain Score  0-No pain   8-9/10 at worst when trying to get in prayer position, 6-7/10 when walking   Pain Location  Knee    Pain Orientation  Right  Pain Descriptors / Indicators  Pressure;Tightness   "pulling" when walking   Pain Type  Chronic pain    Pain Radiating Towards  occasionally down back of leg into foot    Pain Onset  More than a month ago   3-4 months   Pain Frequency  Intermittent    Aggravating Factors   kneeling, squatting, prolonged walking    Pain Relieving Factors  positional changes    Effect of Pain on Daily Activities  prevents her from getting down on knees to pray, limits squatting, limits walking tolernace, has to change the way she stands         [facility] PT Assessment - 11/07/19 1054      Assessment   Medical Diagnosis  R knee pain    Referring Provider (PT)  Penni Homans, MD    Onset Date/Surgical Date  --   3-4 months   Next MD Visit  TBD    Prior Therapy  none      Precautions   Precautions  None      Balance Screen    Has the patient fallen in the past 6 months  No    How many times?  near miss with misstep coming down stairs    Has the patient had a decrease in activity level because of a fear of falling?   No    Is the patient reluctant to leave their home because of a fear of falling?   No      Home Environment   Living Environment  Private residence    Living Arrangements  Spouse/significant other;Children    Available Help at Discharge  Family    Type of [place] Access  Level entry    Conception  Two level;Bed/bath upstairs    Alternate Level Stairs-Rails  Left      Prior Function   Level of Independence  Independent    Vocation  Full time employment    Vocation Requirements  [employer] - blood draws    Leisure  walking up to 1 hr almost daily; cleaning; praying; working in yard      Cognition   Overall Cognitive Status  Within Functional Limits for tasks assessed      Observation/Other Assessments   Focus on Therapeutic Outcomes (FOTO)   Knee - 61% (39% limitation); Predicted 73% (27% limitation)      Posture/Postural Control   Posture/Postural Control  Postural limitations    Posture Comments  B genu recurvatum but pt tries to avoid standing with knees hyperextended      ROM / Strength   AROM / PROM / Strength  AROM;Strength      AROM   AROM Assessment Site  Knee    Right/Left Knee  Right;Left    Right Knee Extension  -5    Right Knee Flexion  125    Left Knee Extension  -5    Left Knee Flexion  135      Strength   Strength Assessment Site  Hip;Knee    Right/Left Hip  Right;Left    Right Hip Flexion  4+/5    Right Hip Extension  4/5   pain in posterior thigh   Right Hip External Rotation   4-/5    Right Hip Internal Rotation  4-/5    Right Hip ABduction  4/5    Right Hip ADduction  4/5    Left Hip Flexion  4+/5    Left  Hip Extension  4+/5    Left Hip External Rotation  4/5    Left Hip Internal Rotation  4+/5    Left Hip ABduction  4+/5    Left Hip ADduction   4+/5    Right/Left Knee  Right;Left    Right Knee Flexion  4-/5   pain   Right Knee Extension  4/5    Left Knee Flexion  5/5    Left Knee Extension  5/5      Flexibility   Soft Tissue Assessment /Muscle Length  yes    Hamstrings  mild tight B    Quadriceps  mild tight R    ITB  mod tight R    Piriformis  mod tight R>L      Palpation   Patella mobility  limited on R    Palpation comment  ttp over R ITB, lateral quads/HS, TFL and glutes/piriformis                Objective measurements completed on examination: See above findings.      [facility] Adult PT Treatment/Exercise - 11/07/19 1054      Exercises   Exercises  Knee/Hip      Knee/Hip Exercises: Stretches   Passive Hamstring Stretch  Right;30 seconds;2 reps    Passive Hamstring Stretch Limitations  supine with strap and seated hip hinge    ITB Stretch  Right;30 seconds;2 reps    ITB Stretch Limitations  supine with strap & standing lateral lean    Piriformis Stretch  Right;30 seconds;4 reps    Piriformis Stretch Limitations  supine & seated KTOS and figure to chest/hip hinge      Manual Therapy   Manual Therapy  Other (comment)    Other Manual Therapy  Provided instruciton in use of rolling pin or like device for self-STM to R ITB and lateral quads/HS             PT Education - 11/07/19 1152    Education Details  PT eval findings, anticipated POC and initial HEP (supine & seated/standing alternatives provided for all stretches)    Person(s) Educated  Patient    Methods  Explanation;Demonstration;Handout    Comprehension  Verbalized understanding;Returned demonstration;Need further instruction       PT Short Term Goals - 11/07/19 1158      PT SHORT TERM GOAL #1   Title  Patient will be independent with initial HEP    Status  New    Target Date  11/21/19        PT Long Term Goals - 11/07/19 1158      PT LONG TERM GOAL #1   Title  Patient will be independent with ongoing/advanced HEP     Status  New    Target Date  12/19/19      PT LONG TERM GOAL #2   Title  Patient to improve tissue quality as noted by reduced tissue tightness and tenderness to palpation    Status  New    Target Date  12/19/19      PT LONG TERM GOAL #3   Title  Patient to improve R knee AROM to WNL without pain provocation    Status  New    Target Date  12/19/19      PT LONG TERM GOAL #4   Title  Patient will demonstrate improved R LE strength to >/= 4+/5 for improved stability and ease of mobility    Status  New    Target  Date  12/19/19      PT LONG TERM GOAL #5   Title  Patient to report ability to perform work and daily activities including walking, squatting and kneeling to pray without pain provocation    Status  New    Target Date  12/19/19             Plan - 11/07/19 1158    Clinical Impression Statement  Katie Henry is a 49 y/o female who presents to OP PT with 3-4 month h/o R knee/LE pain with unknown MOI. Pain initially with end range flexion such as when tucking her feet back under a chair, squatting or kneeling to pray but now also painful with walking. Deficits include R knee/LE pain, limited R knee AROM, decreased proximal LE flexibility with increased muscle tension and ttp throughout R ITB, lateral quads and HS, glutes and piriformis, mild R LE weakness, and limited gait tolerance with antalgic gait pattern. Katie Henry will benefit from skilled PT intervention to address the above listed deficits, reduce pain, and restore functional ROM and strength to allow ability to squat, kneel to pray and walk without pain interference.    Personal Factors and Comorbidities  Comorbidity 3+;Time since onset of injury/illness/exacerbation    Comorbidities  h/o migraines, stress related HTN, hypothyroidism, Graves disease, plantar fasciitis LBP, syncope    Examination-Activity Limitations  Bend;Sit;Squat;Stand;Transfers;Locomotion Level    Examination-Participation Restrictions  Cleaning;Community  Activity;Interpersonal Relationship;Laundry;Meal Prep;Yard Work;Other   praying   Stability/Clinical Decision Making  Stable/Uncomplicated    Clinical Decision Making  Low    Rehab Potential  Good    PT Frequency  2x / week    PT Duration  6 weeks    PT Treatment/Interventions  ADLs/Self Care Home Management;Cryotherapy;Electrical Stimulation;Iontophoresis 4mg /ml Dexamethasone;Moist Heat;Ultrasound;Gait training;Stair training;Functional mobility training;Therapeutic activities;Therapeutic exercise;Balance training;Neuromuscular re-education;Patient/family education;Manual techniques;Passive range of motion;Dry needling;Taping;Vasopneumatic Device;Joint Manipulations    PT Next Visit Plan  Review initial HEP; R LE flexibilty; R knee ROM; proximal LE strengthening; manual therapy to address abnormal muscle tension including possible DN and/ot kinesiotaping; modalities PRN for pain    PT Home Exercise Plan  11/07/19 - HS, ITB & glute/piriformis stretches (supine & seated/standing alternatives provided for all stretches)    Consulted and Agree with Plan of Care  Patient       Patient will benefit from skilled therapeutic intervention in order to improve the following deficits and impairments:  Abnormal gait, Decreased activity tolerance, Decreased balance, Decreased endurance, Decreased mobility, Decreased range of motion, Decreased safety awareness, Decreased strength, Difficulty walking, Increased fascial restricitons, Increased muscle spasms, Impaired perceived functional ability, Impaired flexibility, Improper body mechanics, Postural dysfunction, Pain  Visit Diagnosis: Acute pain of right knee  Stiffness of right knee, not elsewhere classified  Difficulty in walking, not elsewhere classified  Other symptoms and signs involving the musculoskeletal system  Muscle weakness (generalized)     Problem List Patient Active Problem List   Diagnosis Date Noted  . Acute pain of right knee  09/26/2019  . Low back pain 05/20/2019  . Essential hypertension 05/15/2019  . Class 1 obesity with serious comorbidity and body mass index (BMI) of 33.0 to 33.9 in adult 05/15/2019  . Tinea corporis 11/27/2018  . Rectal bleeding 11/27/2018  . Hyperlipidemia 12/13/2017  . Plantar fasciitis 01/11/2017  . Tailor's bunion 01/11/2017  . Grief reaction 11/15/2016  . Hyperglycemia 11/15/2016  . Preventative health care 10/14/2013  . Graves disease   . Endometriosis   . IBS (irritable bowel syndrome)   .  Syncope   . Heel pain 07/17/2012  . Migraines 01/13/2012  . Kidney stones 01/13/2012  . Breast cancer screening 07/08/2011  . Vitamin D deficiency 05/23/2011  . Hypothyroidism 05/23/2011  . Anxiety and depression 05/23/2011    Percival Spanish, PT, MPT 11/07/2019, 12:39 PM  [facility] [address]  [address] [place] [place], [place], [address] Phone: [phone]   Fax:  [phone]  Name: Katie Henry MRN: [id number] Date of Birth: [birth year]

## 2019-11-14 ENCOUNTER — Encounter: Payer: Self-pay | Admitting: Physical Therapy

## 2019-11-14 ENCOUNTER — Ambulatory Visit: Payer: No Typology Code available for payment source | Admitting: Physical Therapy

## 2019-11-14 ENCOUNTER — Other Ambulatory Visit: Payer: Self-pay

## 2019-11-14 DIAGNOSIS — M25561 Pain in right knee: Secondary | ICD-10-CM

## 2019-11-14 DIAGNOSIS — R29898 Other symptoms and signs involving the musculoskeletal system: Secondary | ICD-10-CM

## 2019-11-14 DIAGNOSIS — M25661 Stiffness of right knee, not elsewhere classified: Secondary | ICD-10-CM

## 2019-11-14 DIAGNOSIS — R262 Difficulty in walking, not elsewhere classified: Secondary | ICD-10-CM

## 2019-11-14 DIAGNOSIS — M6281 Muscle weakness (generalized): Secondary | ICD-10-CM

## 2019-11-14 NOTE — Therapy (Signed)
[facility] [address]  [place], [place], [address] Phone: [phone]   Fax:  [phone]  Physical Therapy Treatment  Patient Details  Name: Katie Henry MRN: [id number] Date of Birth: [birth year] Referring Provider (PT): Penni Homans, MD   Encounter Date: 11/14/2019  PT End of Session - 11/14/19 1102    Visit Number  2    Number of Visits  12    Date for PT Re-Evaluation  12/19/19    Authorization Type  [facility]    PT Start Time  1102    PT Stop Time  1154    PT Time Calculation (min)  52 min    Activity Tolerance  Patient tolerated treatment well    Behavior During Therapy  [facility] for tasks assessed/performed       Past Medical History:  Diagnosis Date  . Anxiety   . Back pain   . Dental infection 05/28/2011  . Endometriosis   . Fainting episodes    Dr Luther Parody related  . Graves disease   . Grief reaction 11/15/2016  . History of kidney stones 2003   onset with pregnacy   . History of UTI    last 4-5-weeks ago  . Hyperglycemia 11/15/2016   cbg 100  . Hyperthyroidism   . 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 IBS (irritable bowel syndrome)   . Migraine   . Syncope   . Vitamin D deficiency     Past Surgical History:  Procedure Laterality Date  .  treatment  1998  . APPENDECTOMY  1991  . CYSTOSCOPY/URETEROSCOPY/HOLMIUM LASER/STENT PLACEMENT Left 08/09/2017   Procedure: CYSTOSCOPY/RETROGRADE/URETEROSCOPY/HOLMIUM LASER/STENT PLACEMENT;  Surgeon: Festus Aloe, MD;  Location: [facility];  Service: Urology;  Laterality: Left;  ONLY NEEDS 60 MIN  . ENDOMETRIAL ABLATION  01/2007  . EXTRACORPOREAL SHOCK WAVE LITHOTRIPSY Left 07/21/2017   Procedure: LEFT EXTRACORPOREAL SHOCK WAVE LITHOTRIPSY (ESWL);  Surgeon: Bjorn Loser, MD;  Location: [facility];  Service: Urology;  Laterality: Left;  Marland Kitchen VAGINAL HYSTERECTOMY  2010   ovaries still in place    There were no vitals filed for this  visit.  Subjective Assessment - 11/14/19 1104    Subjective  Pt noting less pain since working on HEP. Able to walk for 30 minutes w/o pain interference.    Diagnostic tests  [place] negative for DVT or Baker's cyst    Patient Stated Goals  "be able to walk w/o knee pain & to be able to squat and pray w/o pain"    Currently in Pain?  No/denies    Pain Score  0-No pain   up to 6/10 when getting up to walk   Pain Location  Knee    Pain Orientation  Right    Pain Descriptors / Indicators  Pressure;Tightness   "pulling"   Pain Type  Chronic pain                       [facility] Adult PT Treatment/Exercise - 11/14/19 1102      Knee/Hip Exercises: Stretches   Passive Hamstring Stretch  Right;30 seconds;2 reps    Passive Hamstring Stretch Limitations  supine with strap    pt denied need for review of seated option   Quad Stretch  Right;30 seconds;2 reps    Quad Stretch Limitations  prone with strap & prone RF with strap    ITB Stretch  Right;30 seconds;2 reps  ITB Stretch Limitations  supine with strap    pt denied need for review of standing option   Piriformis Stretch  Right;30 seconds;2 reps    Piriformis Stretch Limitations  supine KTOS and figure to chest/hip hinge   pt denied need for review of seated options   Gastroc Stretch  Right;30 seconds;2 reps      Knee/Hip Exercises: Aerobic   Recumbent Bike  L1 x 6 min      Knee/Hip Exercises: Supine   Bridges  Both;10 reps;Strengthening    Bridges Limitations  5 sec hold + red TB isometric    Other Supine Knee/Hip Exercises  Alt red TB hip ABD/ER clam 10 x 3"      Knee/Hip Exercises: Sidelying   Hip ABduction  Right;10 reps;Strengthening    Hip ABduction Limitations  + slight hip extension to promote glute medius activation    Clams  R red TB clam & clam with no resistance 10 x 3" each - pt noting increased soreness by end of set with & w/o red TB resistance      Manual Therapy   Manual Therapy  Soft tissue  mobilization;Myofascial release    Soft tissue mobilization  L S/L: STM & DTM to R glutes, piriformis, ITB, lateral quads and HS - most ttp over lateral piriformis and distal ITB    Myofascial Release  manual TPR to medial & lateral R piriformis; pin & stretch to distal R ITB    Other Manual Therapy  Provided instruciton in use of tennis ball or other small ball for self-STM to R glutes & piriformis             PT Education - 11/14/19 1154    Education Details  HEP update - quad and calf stretches, red TB hooklying clam & bridge + hip ABD isometric, self-STM to glutes/piriformis    Person(s) Educated  Patient    Methods  Explanation;Demonstration;Verbal cues;Handout    Comprehension  Verbalized understanding;Returned demonstration;Verbal cues required;Need further instruction       PT Short Term Goals - 11/14/19 1108      PT SHORT TERM GOAL #1   Title  Patient will be independent with initial HEP    Status  On-going    Target Date  11/21/19        PT Long Term Goals - 11/14/19 1108      PT LONG TERM GOAL #1   Title  Patient will be independent with ongoing/advanced HEP    Status  On-going    Target Date  12/19/19      PT LONG TERM GOAL #2   Title  Patient to improve tissue quality as noted by reduced tissue tightness and tenderness to palpation    Status  On-going    Target Date  12/19/19      PT LONG TERM GOAL #3   Title  Patient to improve R knee AROM to WNL without pain provocation    Status  On-going    Target Date  12/19/19      PT LONG TERM GOAL #4   Title  Patient will demonstrate improved R LE strength to >/= 4+/5 for improved stability and ease of mobility    Status  On-going    Target Date  12/19/19      PT LONG TERM GOAL #5   Title  Patient to report ability to perform work and daily activities including walking, squatting and kneeling to pray without pain provocation  Status  On-going    Target Date  12/19/19            Plan - 11/14/19  1109    Clinical Impression Statement  Katie Henry noting initial benefit from HEP stretches, with less pain and improving walking tolerance but still notes pain up to 6/10 upon initially getting up to walk. Addressed tightness in R glutes, piriformis and lateral thigh/leg with manual therapy and review of initial HEP stretches, with pt noting significant relief from manual therapy to R piriformis - updated HEP to include quad and calf stretches as well as self-STM for glutes and/or piriformis using small ball on wall or chair. If increased muscle tension persists, she may benefit from trial of DN in upcoming visits. Limited tolerance for initial strengthening exercises with pain/discomfort increasing with fatigue.    Personal Factors and Comorbidities  Comorbidity 3+;Time since onset of injury/illness/exacerbation    Comorbidities  h/o migraines, stress related HTN, hypothyroidism, Graves disease, plantar fasciitis LBP, syncope    Examination-Activity Limitations  Bend;Sit;Squat;Stand;Transfers;Locomotion Level    Examination-Participation Restrictions  Cleaning;Community Activity;Interpersonal Relationship;Laundry;Meal Prep;Yard Work;Other   praying   Rehab Potential  Good    PT Frequency  2x / week    PT Duration  6 weeks    PT Treatment/Interventions  ADLs/Self Care Home Management;Cryotherapy;Electrical Stimulation;Iontophoresis 4mg /ml Dexamethasone;Moist Heat;Ultrasound;Gait training;Stair training;Functional mobility training;Therapeutic activities;Therapeutic exercise;Balance training;Neuromuscular re-education;Patient/family education;Manual techniques;Passive range of motion;Dry needling;Taping;Vasopneumatic Device;Joint Manipulations    PT Next Visit Plan  R LE flexibilty; R knee ROM; proximal LE strengthening; manual therapy to address abnormal muscle tension including possible DN and/or kinesiotaping; modalities PRN for pain    PT Home Exercise Plan  11/07/19 - HS, ITB & glute/piriformis stretches  (supine & seated/standing alternatives provided for all stretches)    Consulted and Agree with Plan of Care  Patient       Patient will benefit from skilled therapeutic intervention in order to improve the following deficits and impairments:  Abnormal gait, Decreased activity tolerance, Decreased balance, Decreased endurance, Decreased mobility, Decreased range of motion, Decreased safety awareness, Decreased strength, Difficulty walking, Increased fascial restricitons, Increased muscle spasms, Impaired perceived functional ability, Impaired flexibility, Improper body mechanics, Postural dysfunction, Pain  Visit Diagnosis: Acute pain of right knee  Stiffness of right knee, not elsewhere classified  Difficulty in walking, not elsewhere classified  Other symptoms and signs involving the musculoskeletal system  Muscle weakness (generalized)     Problem List Patient Active Problem List   Diagnosis Date Noted  . Acute pain of right knee 09/26/2019  . Low back pain 05/20/2019  . Essential hypertension 05/15/2019  . Class 1 obesity with serious comorbidity and body mass index (BMI) of 33.0 to 33.9 in adult 05/15/2019  . Tinea corporis 11/27/2018  . Rectal bleeding 11/27/2018  . Hyperlipidemia 12/13/2017  . Plantar fasciitis 01/11/2017  . Tailor's bunion 01/11/2017  . Grief reaction 11/15/2016  . Hyperglycemia 11/15/2016  . Preventative health care 10/14/2013  . Graves disease   . Endometriosis   . IBS (irritable bowel syndrome)   . Syncope   . Heel pain 07/17/2012  . Migraines 01/13/2012  . Kidney stones 01/13/2012  . Breast cancer screening 07/08/2011  . Vitamin D deficiency 05/23/2011  . Hypothyroidism 05/23/2011  . Anxiety and depression 05/23/2011    Percival Spanish, PT, MPT 11/14/2019, 1:48 PM  [facility] [address]  [place], [place], [address] Phone: [phone]   Fax:  [phone]  Name: Katie Henry MRN: [id number] Date of Birth: 14-Jan-1971

## 2019-11-14 NOTE — Patient Instructions (Signed)
    Home exercise program created by Yamilex Borgwardt, PT.  For questions, please contact Kinberly Perris via phone at [phone] or email at [email or web address]  [facility] [address]  [address] [place], [place], [address] Phone: [phone]   Fax:  [phone]    

## 2019-11-20 ENCOUNTER — Ambulatory Visit: Payer: No Typology Code available for payment source | Attending: Family Medicine | Admitting: Physical Therapy

## 2019-11-20 ENCOUNTER — Other Ambulatory Visit: Payer: Self-pay

## 2019-11-20 ENCOUNTER — Encounter: Payer: Self-pay | Admitting: Physical Therapy

## 2019-11-20 DIAGNOSIS — M6281 Muscle weakness (generalized): Secondary | ICD-10-CM | POA: Insufficient documentation

## 2019-11-20 DIAGNOSIS — R262 Difficulty in walking, not elsewhere classified: Secondary | ICD-10-CM | POA: Insufficient documentation

## 2019-11-20 DIAGNOSIS — M25561 Pain in right knee: Secondary | ICD-10-CM | POA: Insufficient documentation

## 2019-11-20 DIAGNOSIS — M25661 Stiffness of right knee, not elsewhere classified: Secondary | ICD-10-CM | POA: Insufficient documentation

## 2019-11-20 DIAGNOSIS — R29898 Other symptoms and signs involving the musculoskeletal system: Secondary | ICD-10-CM | POA: Insufficient documentation

## 2019-11-20 NOTE — Patient Instructions (Signed)
    Home exercise program created by Iva Montelongo, PT.  For questions, please contact Guillermo Nehring via phone at [phone] or email at [email or web address]  [facility] [place] [facility] [address]  [address] [place], [place], [address] Phone: [phone]   Fax:  [phone]    

## 2019-11-20 NOTE — Therapy (Signed)
[facility] [address]  [place], [place], [address] Phone: [phone]   Fax:  [phone]  Physical Therapy Treatment  Patient Details  Name: Katie Henry MRN: [id number] Date of Birth: Mar 14, 1971 Referring Provider (PT): Penni Homans, MD   Encounter Date: 11/20/2019  PT End of Session - 11/20/19 1613    Visit Number  3    Number of Visits  12    Date for PT Re-Evaluation  12/19/19    Authorization Type  [facility]    PT Start Time  [id number]    PT Stop Time  1659    PT Time Calculation (min)  46 min    Activity Tolerance  Patient tolerated treatment well    Behavior During Therapy  [facility] for tasks assessed/performed       Past Medical History:  Diagnosis Date  . Anxiety   . Back pain   . Dental infection 05/28/2011  . Endometriosis   . Fainting episodes    Dr Luther Parody related  . Graves disease   . Grief reaction 11/15/2016  . History of kidney stones 2003   onset with pregnacy   . History of UTI    last 4-5-weeks ago  . Hyperglycemia 11/15/2016   cbg 100  . Hyperthyroidism   . 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 IBS (irritable bowel syndrome)   . Migraine   . Syncope   . Vitamin D deficiency     Past Surgical History:  Procedure Laterality Date  .  treatment  1998  . APPENDECTOMY  1991  . CYSTOSCOPY/URETEROSCOPY/HOLMIUM LASER/STENT PLACEMENT Left 08/09/2017   Procedure: CYSTOSCOPY/RETROGRADE/URETEROSCOPY/HOLMIUM LASER/STENT PLACEMENT;  Surgeon: Festus Aloe, MD;  Location: [facility];  Service: Urology;  Laterality: Left;  ONLY NEEDS 60 MIN  . ENDOMETRIAL ABLATION  01/2007  . EXTRACORPOREAL SHOCK WAVE LITHOTRIPSY Left 07/21/2017   Procedure: LEFT EXTRACORPOREAL SHOCK WAVE LITHOTRIPSY (ESWL);  Surgeon: Bjorn Loser, MD;  Location: [facility];  Service: Urology;  Laterality: Left;  Marland Kitchen VAGINAL HYSTERECTOMY  2010   ovaries still in place    There were no vitals filed for this  visit.  Subjective Assessment - 11/20/19 1615    Subjective  Pt reports her pain is much better - states "it's been a long time since I haven't had any pain in my hip".    Diagnostic tests  [place] negative for DVT or Baker's cyst    Patient Stated Goals  "be able to walk w/o knee pain & to be able to squat and pray w/o pain"    Currently in Pain?  Yes    Pain Score  0-No pain   2-3/10 when first getting up to start walking   Pain Location  Knee    Pain Orientation  Right;Lateral    Pain Descriptors / Indicators  Pressure;Tightness    Pain Type  Chronic pain                       [facility] Adult PT Treatment/Exercise - 11/20/19 1613      Exercises   Exercises  Knee/Hip      Knee/Hip Exercises: Aerobic   Stationary Bike  --    Recumbent Bike  L1 x 6 min      Knee/Hip Exercises: Standing   Wall Squat  10 reps;5 seconds    Wall Squat Limitations  + red TB hip ABD isometric    Other Standing Knee  Exercises  Red TB lateral & fwd/back monster walks 2 x 20 ft      Knee/Hip Exercises: Seated   Other Seated Knee/Hip Exercises  R/L red TB hip IR with ball between knees x 10      Manual Therapy   Manual Therapy  Soft tissue mobilization;Myofascial release    Manual therapy comments  L side lying    Soft tissue mobilization  STM/DTM & XFM to R ITB, lateral quads, HS & gastroc - most ttp over distal lateral thigh    Myofascial Release  manual TPR to R lateral gastroc, HS & quads; pin & stretch to distal R ITB    Other Manual Therapy  Provided instruction in foam rolling for glutes/piriformis, HS, ITB, quads and calves             PT Education - 11/20/19 1659    Education Details  HEP update -foam rolling for glutes/piriformis, HS, ITB, quads and calves    Person(s) Educated  Patient    Methods  Explanation;Demonstration;Handout    Comprehension  Verbalized understanding;Returned demonstration       PT Short Term Goals - 11/20/19 1617      PT SHORT TERM GOAL #1    Title  Patient will be independent with initial HEP    Status  Achieved   11/20/19       PT Long Term Goals - 11/14/19 1108      PT LONG TERM GOAL #1   Title  Patient will be independent with ongoing/advanced HEP    Status  On-going    Target Date  12/19/19      PT LONG TERM GOAL #2   Title  Patient to improve tissue quality as noted by reduced tissue tightness and tenderness to palpation    Status  On-going    Target Date  12/19/19      PT LONG TERM GOAL #3   Title  Patient to improve R knee AROM to WNL without pain provocation    Status  On-going    Target Date  12/19/19      PT LONG TERM GOAL #4   Title  Patient will demonstrate improved R LE strength to >/= 4+/5 for improved stability and ease of mobility    Status  On-going    Target Date  12/19/19      PT LONG TERM GOAL #5   Title  Patient to report ability to perform work and daily activities including walking, squatting and kneeling to pray without pain provocation    Status  On-going    Target Date  12/19/19            Plan - 11/20/19 1618    Clinical Impression Statement  Katie Henry reports resolution of R hip pain and significant reduction in R lateral leg/knee pain following manual therapy last visit, now only noting mild (2-3/10) pain near proximal and distal lateral R knee when first getting up to start walking. Increased muscle tension still evident in R ITB, lateral HS, lateral quads and lateral gastroc which was addressed with manual therapy followed by instruction in foam rolling for affected areas (pt opting to defer DN at this time but may be interested at a later date). Progressed proximal LE strengthening with emphasis on lateral stability with good tolerance other than slight lateral knee pain reported after lateral band walk which resolved upon cessation of exercise.    Personal Factors and Comorbidities  Comorbidity 3+;Time since onset of injury/illness/exacerbation  Comorbidities  h/o migraines,  stress related HTN, hypothyroidism, Graves disease, plantar fasciitis LBP, syncope    Examination-Activity Limitations  Bend;Sit;Squat;Stand;Transfers;Locomotion Level    Examination-Participation Restrictions  Cleaning;Community Activity;Interpersonal Relationship;Laundry;Meal Prep;Yard Work;Other   praying   Rehab Potential  Good    PT Frequency  2x / week    PT Duration  6 weeks    PT Treatment/Interventions  ADLs/Self Care Home Management;Cryotherapy;Electrical Stimulation;Iontophoresis 4mg /ml Dexamethasone;Moist Heat;Ultrasound;Gait training;Stair training;Functional mobility training;Therapeutic activities;Therapeutic exercise;Balance training;Neuromuscular re-education;Patient/family education;Manual techniques;Passive range of motion;Dry needling;Taping;Vasopneumatic Device;Joint Manipulations    PT Next Visit Plan  R LE flexibilty; R knee ROM; proximal LE strengthening; manual therapy to address abnormal muscle tension including possible DN and/or kinesiotaping; modalities PRN for pain    PT Home Exercise Plan  11/07/19 - HS, ITB & glute/piriformis stretches (supine & seated/standing alternatives provided for all stretches); 4/28 - quad and calf stretches, red TB hooklying clam & bridge + hip ABD isometric, self-STM to glutes/piriformis; 5/4 - foam rolling for glutes/piriformis, HS, ITB, quads and calves    Consulted and Agree with Plan of Care  Patient       Patient will benefit from skilled therapeutic intervention in order to improve the following deficits and impairments:  Abnormal gait, Decreased activity tolerance, Decreased balance, Decreased endurance, Decreased mobility, Decreased range of motion, Decreased safety awareness, Decreased strength, Difficulty walking, Increased fascial restricitons, Increased muscle spasms, Impaired perceived functional ability, Impaired flexibility, Improper body mechanics, Postural dysfunction, Pain  Visit Diagnosis: Acute pain of right  knee  Stiffness of right knee, not elsewhere classified  Difficulty in walking, not elsewhere classified  Other symptoms and signs involving the musculoskeletal system  Muscle weakness (generalized)     Problem List Patient Active Problem List   Diagnosis Date Noted  . Acute pain of right knee 09/26/2019  . Low back pain 05/20/2019  . Essential hypertension 05/15/2019  . Class 1 obesity with serious comorbidity and body mass index (BMI) of 33.0 to 33.9 in adult 05/15/2019  . Tinea corporis 11/27/2018  . Rectal bleeding 11/27/2018  . Hyperlipidemia 12/13/2017  . Plantar fasciitis 01/11/2017  . Tailor's bunion 01/11/2017  . Grief reaction 11/15/2016  . Hyperglycemia 11/15/2016  . Preventative health care 10/14/2013  . Graves disease   . Endometriosis   . IBS (irritable bowel syndrome)   . Syncope   . Heel pain 07/17/2012  . Migraines 01/13/2012  . Kidney stones 01/13/2012  . Breast cancer screening 07/08/2011  . Vitamin D deficiency 05/23/2011  . Hypothyroidism 05/23/2011  . Anxiety and depression 05/23/2011    Percival Spanish, PT, MPT 11/20/2019, 6:50 PM  [facility] [address]  [place], [place], [address] Phone: [phone]   Fax:  [phone]  Name: Katie Henry MRN: [id number] Date of Birth: [birth year]

## 2019-11-21 MED FILL — VIT D2 1.25 MG (50,000 UNIT: 1.25 MG | 28 days supply | Qty: 4 | Fill #2

## 2019-11-21 MED FILL — SYNTHROID 175 MCG TABLET: 175 | 30 days supply | Qty: 30 | Fill #3

## 2019-11-22 ENCOUNTER — Other Ambulatory Visit: Payer: Self-pay

## 2019-11-22 ENCOUNTER — Ambulatory Visit: Payer: No Typology Code available for payment source

## 2019-11-22 DIAGNOSIS — M25561 Pain in right knee: Secondary | ICD-10-CM | POA: Diagnosis not present

## 2019-11-22 DIAGNOSIS — M25661 Stiffness of right knee, not elsewhere classified: Secondary | ICD-10-CM

## 2019-11-22 DIAGNOSIS — M6281 Muscle weakness (generalized): Secondary | ICD-10-CM

## 2019-11-22 DIAGNOSIS — R262 Difficulty in walking, not elsewhere classified: Secondary | ICD-10-CM

## 2019-11-22 DIAGNOSIS — R29898 Other symptoms and signs involving the musculoskeletal system: Secondary | ICD-10-CM

## 2019-11-22 NOTE — Therapy (Addendum)
[facility] [address]  [place], [place], [address] Phone: [phone]   Fax:  [phone]  Physical Therapy Treatment / Discharge Summary  Patient Details  Name: Katie Henry MRN: [id number] Date of Birth: 12/27/1970 Referring Provider (PT): Penni Homans, MD   Encounter Date: 11/22/2019  PT End of Session - 11/22/19 1545    Visit Number  4    Number of Visits  12    Date for PT Re-Evaluation  12/19/19    Authorization Type  [facility]    PT Start Time  1538    PT Stop Time  1624    PT Time Calculation (min)  46 min    Activity Tolerance  Patient tolerated treatment well    Behavior During Therapy  [facility] for tasks assessed/performed       Past Medical History:  Diagnosis Date  . Anxiety   . Back pain   . Dental infection 05/28/2011  . Endometriosis   . Fainting episodes    Dr Luther Parody related  . Graves disease   . Grief reaction 11/15/2016  . History of kidney stones 2003   onset with pregnacy   . History of UTI    last 4-5-weeks ago  . Hyperglycemia 11/15/2016   cbg 100  . Hyperthyroidism   . 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 IBS (irritable bowel syndrome)   . Migraine   . Syncope   . Vitamin D deficiency     Past Surgical History:  Procedure Laterality Date  .  treatment  1998  . APPENDECTOMY  1991  . CYSTOSCOPY/URETEROSCOPY/HOLMIUM LASER/STENT PLACEMENT Left 08/09/2017   Procedure: CYSTOSCOPY/RETROGRADE/URETEROSCOPY/HOLMIUM LASER/STENT PLACEMENT;  Surgeon: Festus Aloe, MD;  Location: [facility];  Service: Urology;  Laterality: Left;  ONLY NEEDS 60 MIN  . ENDOMETRIAL ABLATION  01/2007  . EXTRACORPOREAL SHOCK WAVE LITHOTRIPSY Left 07/21/2017   Procedure: LEFT EXTRACORPOREAL SHOCK WAVE LITHOTRIPSY (ESWL);  Surgeon: Bjorn Loser, MD;  Location: [facility];  Service: Urology;  Laterality: Left;  Marland Kitchen VAGINAL HYSTERECTOMY  2010   ovaries still in place    There were no vitals  filed for this visit.  Subjective Assessment - 11/22/19 1540    Subjective  Pt. reporting she has yet to purchase foam roller however plans to purchase at [employer].    How long can you sit comfortably?  30-40 min    How long can you stand comfortably?  10 minutes    How long can you walk comfortably?  30-40 min    Diagnostic tests  [place] negative for DVT or Baker's cyst    Patient Stated Goals  "be able to walk w/o knee pain & to be able to squat and pray w/o pain"    Currently in Pain?  No/denies    Pain Score  0-No pain   up to 2/10 at worst   Pain Location  Knee    Pain Orientation  Right;Lateral    Pain Descriptors / Indicators  Pressure;Tightness    Pain Type  Chronic pain    Pain Radiating Towards  denies today    Pain Frequency  Intermittent    Aggravating Factors   kneeling                       [facility] Adult PT Treatment/Exercise - 11/22/19 0001      Knee/Hip Exercises: Stretches   Passive Hamstring Stretch  Right;30 seconds;2 reps  Passive Hamstring Stretch Limitations  supine with strap     Hip Flexor Stretch  Right;1 rep;30 seconds    Hip Flexor Stretch Limitations  mod thomas pos    ITB Stretch  Right;30 seconds;2 reps    ITB Stretch Limitations  supine with strap       Knee/Hip Exercises: Aerobic   Recumbent Bike  --   no warmup performed due to no machine availability      Knee/Hip Exercises: Machines for Strengthening   Cybex Knee Flexion  B LE:  20# x 15 reps       Knee/Hip Exercises: Standing   Other Standing Knee Exercises  Red TB lateral & fwd/back monster walks 2 x 20 ft      Knee/Hip Exercises: Supine   Bridges  Both;15 reps;Strengthening    Bridges Limitations  3 sec hold + green TB isometric      Manual Therapy   Manual Therapy  Soft tissue mobilization;Myofascial release    Manual therapy comments  L side lying    Soft tissue mobilization  STM/strumming to R glute med, piriformis, R lateral HS,  VL - focused DTM to R piriformis,  R glute med    Myofascial Release  TPR to R piriformis, R glute med               PT Short Term Goals - 11/20/19 1617      PT SHORT TERM GOAL #1   Title  Patient will be independent with initial HEP    Status  Achieved   11/20/19       PT Long Term Goals - 11/14/19 1108      PT LONG TERM GOAL #1   Title  Patient will be independent with ongoing/advanced HEP    Status  On-going    Target Date  12/19/19      PT LONG TERM GOAL #2   Title  Patient to improve tissue quality as noted by reduced tissue tightness and tenderness to palpation    Status  On-going    Target Date  12/19/19      PT LONG TERM GOAL #3   Title  Patient to improve R knee AROM to WNL without pain provocation    Status  On-going    Target Date  12/19/19      PT LONG TERM GOAL #4   Title  Patient will demonstrate improved R LE strength to >/= 4+/5 for improved stability and ease of mobility    Status  On-going    Target Date  12/19/19      PT LONG TERM GOAL #5   Title  Patient to report ability to perform work and daily activities including walking, squatting and kneeling to pray without pain provocation    Status  On-going    Target Date  12/19/19            Plan - 11/22/19 1549    Clinical Impression Statement  Pt. reporting some R knee pain relief since starting physical therapy with R knee pain going from constant > intermittent 2/10 pain most upon first few steps after standing.  Also having R lateral knee pain when kneeling.  Was able to progress with standing red TB resisted side stepping and pain free with progression of bridge/abduction.  Appears to be tolerating progression of strengthening activities without complaint of soreness after session.  Continued MT to address R proximal hip tension with good relief noted.  Pt. leaving session pain free.  Comorbidities  h/o migraines, stress related HTN, hypothyroidism, Graves disease, plantar fasciitis LBP, syncope    Rehab Potential  Good     PT Treatment/Interventions  ADLs/Self Care Home Management;Cryotherapy;Electrical Stimulation;Iontophoresis '4mg'$ /ml Dexamethasone;Moist Heat;Ultrasound;Gait training;Stair training;Functional mobility training;Therapeutic activities;Therapeutic exercise;Balance training;Neuromuscular re-education;Patient/family education;Manual techniques;Passive range of motion;Dry needling;Taping;Vasopneumatic Device;Joint Manipulations    PT Next Visit Plan  R LE flexibilty; R knee ROM; proximal LE strengthening; manual therapy to address abnormal muscle tension including possible DN and/or kinesiotaping; modalities PRN for pain    PT Home Exercise Plan  11/07/19 - HS, ITB & glute/piriformis stretches (supine & seated/standing alternatives provided for all stretches); 4/28 - quad and calf stretches, red TB hooklying clam & bridge + hip ABD isometric, self-STM to glutes/piriformis; 5/4 - foam rolling for glutes/piriformis, HS, ITB, quads and calves    Consulted and Agree with Plan of Care  Patient       Patient will benefit from skilled therapeutic intervention in order to improve the following deficits and impairments:  Abnormal gait, Decreased activity tolerance, Decreased balance, Decreased endurance, Decreased mobility, Decreased range of motion, Decreased safety awareness, Decreased strength, Difficulty walking, Increased fascial restricitons, Increased muscle spasms, Impaired perceived functional ability, Impaired flexibility, Improper body mechanics, Postural dysfunction, Pain  Visit Diagnosis: Acute pain of right knee  Stiffness of right knee, not elsewhere classified  Difficulty in walking, not elsewhere classified  Other symptoms and signs involving the musculoskeletal system  Muscle weakness (generalized)     Problem List Patient Active Problem List   Diagnosis Date Noted  . Acute pain of right knee 09/26/2019  . Low back pain 05/20/2019  . Essential hypertension 05/15/2019  . Class 1  obesity with serious comorbidity and body mass index (BMI) of 33.0 to 33.9 in adult 05/15/2019  . Tinea corporis 11/27/2018  . Rectal bleeding 11/27/2018  . Hyperlipidemia 12/13/2017  . Plantar fasciitis 01/11/2017  . Tailor's bunion 01/11/2017  . Grief reaction 11/15/2016  . Hyperglycemia 11/15/2016  . Preventative health care 10/14/2013  . Graves disease   . Endometriosis   . IBS (irritable bowel syndrome)   . Syncope   . Heel pain 07/17/2012  . Migraines 01/13/2012  . Kidney stones 01/13/2012  . Breast cancer screening 07/08/2011  . Vitamin D deficiency 05/23/2011  . Hypothyroidism 05/23/2011  . Anxiety and depression 05/23/2011    Bess Harvest, PTA 11/22/19 4:43 PM   [facility] [address]  [place], [place], [address] Phone: [phone]   Fax:  [phone]  Name: Katie Henry MRN: [id number] Date of Birth: [birth year]  PHYSICAL THERAPY DISCHARGE SUMMARY  Visits from Start of Care: 4  Current functional level related to goals / functional outcomes:   Refer to above clinical impression for status as of last visit on 11/22/2019. Patient had to take a break from PT to care for her husband and has not been able to return to PT in > 30 days, therefore will proceed with discharge from PT for this episode.   Remaining deficits:   As above.    Education / Equipment:   HEP  Plan: Patient agrees to discharge.  Patient goals were partially met. Patient is being discharged due to not returning since the last visit. due to caring for her husband.  ?????     Percival Spanish, PT, MPT 01/15/20, 2:34 PM  [facility] [place], [place], [address] Phone:  [phone]   Fax:  [phone]

## 2019-11-25 ENCOUNTER — Other Ambulatory Visit: Payer: Self-pay

## 2019-11-25 ENCOUNTER — Emergency Department (HOSPITAL_BASED_OUTPATIENT_CLINIC_OR_DEPARTMENT_OTHER): Payer: No Typology Code available for payment source

## 2019-11-25 ENCOUNTER — Encounter (HOSPITAL_BASED_OUTPATIENT_CLINIC_OR_DEPARTMENT_OTHER): Payer: Self-pay | Admitting: Emergency Medicine

## 2019-11-25 ENCOUNTER — Emergency Department (HOSPITAL_BASED_OUTPATIENT_CLINIC_OR_DEPARTMENT_OTHER)
Admission: EM | Admit: 2019-11-25 | Discharge: 2019-11-25 | Disposition: A | Discharge status: Home / Self Care | Payer: No Typology Code available for payment source | Attending: Emergency Medicine | Admitting: Emergency Medicine

## 2019-11-25 DIAGNOSIS — I10 Essential (primary) hypertension: Secondary | ICD-10-CM | POA: Insufficient documentation

## 2019-11-25 DIAGNOSIS — E876 Hypokalemia: Secondary | ICD-10-CM | POA: Diagnosis not present

## 2019-11-25 DIAGNOSIS — R002 Palpitations: Secondary | ICD-10-CM | POA: Diagnosis present

## 2019-11-25 DIAGNOSIS — E039 Hypothyroidism, unspecified: Secondary | ICD-10-CM | POA: Diagnosis not present

## 2019-11-25 DIAGNOSIS — Z79899 Other long term (current) drug therapy: Secondary | ICD-10-CM | POA: Insufficient documentation

## 2019-11-25 DIAGNOSIS — I493 Ventricular premature depolarization: Secondary | ICD-10-CM | POA: Insufficient documentation

## 2019-11-25 DIAGNOSIS — Z87891 Personal history of nicotine dependence: Secondary | ICD-10-CM | POA: Insufficient documentation

## 2019-11-25 LAB — CBC WITH DIFFERENTIAL/PLATELET
Abs Immature Granulocytes: 0.03 10*3/uL (ref 0.00–0.07)
Basophils Absolute: 0.1 10*3/uL (ref 0.0–0.1)
Basophils Relative: 1 %
Eosinophils Absolute: 0.2 10*3/uL (ref 0.0–0.5)
Eosinophils Relative: 1 %
HCT: 44.5 % (ref 36.0–46.0)
Hemoglobin: 15 g/dL (ref 12.0–15.0)
Immature Granulocytes: 0 %
Lymphocytes Relative: 28 %
Lymphs Abs: 3.7 10*3/uL (ref 0.7–4.0)
MCH: 31.8 pg (ref 26.0–34.0)
MCHC: 33.7 g/dL (ref 30.0–36.0)
MCV: 94.3 fL (ref 80.0–100.0)
Monocytes Absolute: 1 10*3/uL (ref 0.1–1.0)
Monocytes Relative: 8 %
Neutro Abs: 8 10*3/uL — ABNORMAL HIGH (ref 1.7–7.7)
Neutrophils Relative %: 62 %
Platelets: 422 10*3/uL — ABNORMAL HIGH (ref 150–400)
RBC: 4.72 MIL/uL (ref 3.87–5.11)
RDW: 12.3 % (ref 11.5–15.5)
WBC: 12.9 10*3/uL — ABNORMAL HIGH (ref 4.0–10.5)
nRBC: 0 % (ref 0.0–0.2)

## 2019-11-25 LAB — BASIC METABOLIC PANEL
Anion gap: 10 (ref 5–15)
BUN: 14 mg/dL (ref 6–20)
CO2: 22 mmol/L (ref 22–32)
Calcium: 9 mg/dL (ref 8.9–10.3)
Chloride: 105 mmol/L (ref 98–111)
Creatinine, Ser: 0.68 mg/dL (ref 0.44–1.00)
GFR calc Af Amer: >60 mL/min (ref >60)
GFR calc non Af Amer: >60 mL/min (ref >60)
Glucose, Bld: 98 mg/dL (ref 70–99)
Potassium: 3.4 mmol/L — ABNORMAL LOW (ref 3.5–5.1)
Sodium: 137 mmol/L (ref 135–145)

## 2019-11-25 LAB — TROPONIN I (HIGH SENSITIVITY)
Troponin I (High Sensitivity): <2 ng/L (ref <18)
Troponin I (High Sensitivity): 2 ng/L (ref <18)

## 2019-11-25 LAB — MAGNESIUM: Magnesium: 2 mg/dL (ref 1.7–2.4)

## 2019-11-25 MED ORDER — POTASSIUM CHLORIDE CRYS ER 20 MEQ PO TBCR
20.0000 meq | EXTENDED_RELEASE_TABLET | Freq: Every day | ORAL | 0 refills | Status: DC
Start: 2019-11-25 — End: 2020-07-17

## 2019-11-25 MED ORDER — POTASSIUM CHLORIDE CRYS ER 20 MEQ PO TBCR
40.0000 meq | EXTENDED_RELEASE_TABLET | Freq: Once | ORAL | Status: AC
  Administered 2019-11-25: 40 meq via ORAL
  Filled 2019-11-25: qty 2

## 2019-11-25 MED ORDER — POTASSIUM CHLORIDE CRYS ER 20 MEQ PO TBCR
20.0000 meq | EXTENDED_RELEASE_TABLET | Freq: Every day | ORAL | 0 refills | Status: DC
Start: 2019-11-25 — End: 2019-11-25

## 2019-11-25 MED ORDER — METOPROLOL TARTRATE 25 MG PO TABS
12.5000 mg | ORAL_TABLET | Freq: Two times a day (BID) | ORAL | 0 refills | Status: DC
Start: 2019-11-25 — End: 2020-07-17

## 2019-11-25 MED ORDER — METOPROLOL TARTRATE 50 MG PO TABS
ORAL_TABLET | ORAL | Status: AC
  Filled 2019-11-25: qty 1

## 2019-11-25 MED ORDER — METOPROLOL TARTRATE 25 MG PO TABS
12.5000 mg | ORAL_TABLET | Freq: Two times a day (BID) | ORAL | 0 refills | Status: DC
Start: 2019-11-25 — End: 2019-11-25

## 2019-11-25 MED ORDER — METOPROLOL TARTRATE 12.5 MG HALF TABLET
12.5000 mg | ORAL_TABLET | Freq: Once | ORAL | Status: AC
  Administered 2019-11-25: 12.5 mg via ORAL
  Filled 2019-11-25: qty 1

## 2019-11-25 NOTE — ED Notes (Signed)
Pharmacy updated with patient 

## 2019-11-25 NOTE — Discharge Instructions (Signed)
1.  Take supplemental potassium as prescribed.  You must follow-up with your doctor soon for a recheck of your level. 2.  Start low-dose of metoprolol as prescribed.  Monitor your blood pressures at home.  If your blood pressure is low and you feel lightheaded or dizzy, discontinue the metoprolol. 3.  Call your doctor's office tomorrow for referral to cardiology for further evaluation and a follow up appointment with your doctor this week. 4.  Return to the emergency department if you develop chest pain, feel like you will pass out, have worsening symptoms or other concerning symptoms.

## 2019-11-25 NOTE — ED Triage Notes (Signed)
Pt c/o palpitations and a "thumping" in chest that pt reports started around 1900 tonight, also endorses sob when palpitations occur. Pt denies fever, denies CP

## 2019-11-25 NOTE — ED Provider Notes (Signed)
[facility] Provider Note   CSN: [id number] Arrival date & time: 11/25/19  1930     History Chief Complaint  Patient presents with  . Palpitations    Katie Henry is a 49 y.o. female.  HPI Patient reports that for 3 weeks she has been having palpitations that have come and gone.  She reports typically has been shorter episodes it resolved within 15 minutes.  She could feel her heart beating hard intermittently.  Within the past 24 hours however this increased in frequency and duration.  She reports her be prolonged periods of feeling like her heart was beating really hard and thumping in her chest.  She reports when it happens she feels mildly short of breath.  She reports is not painful.  She reports she did not have any lightheadedness or feeling like she is going to pass out until just in the emergency department when she stood and transition from the wheelchair to the stretcher she felt slightly lightheaded.  Patient denies prior problems with palpitations or heart rhythm abnormalities.  She does take Synthroid for hypothyroidism.  She reports her been no recent dose changes.  No recent new medications or discontinue medications.  Patient denies any consumption of caffeine, she is a non-smoker and nondrinker.  She reports she is taking a biotin hair and nail supplement but no other over-the-counter supplements or dietary pills.  Patient reports history of migraines but has not had migraines in a number of years and is not taking any migraine specific medications currently.    Past Medical History:  Diagnosis Date  . Anxiety   . Back pain   . Dental infection 05/28/2011  . Endometriosis   . Fainting episodes    Dr Luther Parody related  . Graves disease   . Grief reaction 11/15/2016  . History of kidney stones 2003   onset with pregnacy   . History of UTI    last 4-5-weeks ago  . Hyperglycemia 11/15/2016   cbg 100  . Hyperthyroidism   .  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 IBS (irritable bowel syndrome)   . Migraine   . Syncope   . Vitamin D deficiency     Patient Active Problem List   Diagnosis Date Noted  . Acute pain of right knee 09/26/2019  . Low back pain 05/20/2019  . Essential hypertension 05/15/2019  . Class 1 obesity with serious comorbidity and body mass index (BMI) of 33.0 to 33.9 in adult 05/15/2019  . Tinea corporis 11/27/2018  . Rectal bleeding 11/27/2018  . Hyperlipidemia 12/13/2017  . Plantar fasciitis 01/11/2017  . Tailor's bunion 01/11/2017  . Grief reaction 11/15/2016  . Hyperglycemia 11/15/2016  . Preventative health care 10/14/2013  . Graves disease   . Endometriosis   . IBS (irritable bowel syndrome)   . Syncope   . Heel pain 07/17/2012  . Migraines 01/13/2012  . Kidney stones 01/13/2012  . Breast cancer screening 07/08/2011  . Vitamin D deficiency 05/23/2011  . Hypothyroidism 05/23/2011  . Anxiety and depression 05/23/2011    Past Surgical History:  Procedure Laterality Date  .  treatment  1998  . APPENDECTOMY  1991  . CYSTOSCOPY/URETEROSCOPY/HOLMIUM LASER/STENT PLACEMENT Left 08/09/2017   Procedure: CYSTOSCOPY/RETROGRADE/URETEROSCOPY/HOLMIUM LASER/STENT PLACEMENT;  Surgeon: Festus Aloe, MD;  Location: [facility];  Service: Urology;  Laterality: Left;  ONLY NEEDS 60 MIN  . ENDOMETRIAL ABLATION  01/2007  . EXTRACORPOREAL SHOCK WAVE LITHOTRIPSY Left 07/21/2017   Procedure: LEFT  EXTRACORPOREAL SHOCK WAVE LITHOTRIPSY (ESWL);  Surgeon: Bjorn Loser, MD;  Location: [facility];  Service: Urology;  Laterality: Left;  Marland Kitchen VAGINAL HYSTERECTOMY  2010   ovaries still in place     OB History    Gravida  2   Para  2   Term      Preterm      AB      Living  2     SAB      TAB      Ectopic      Multiple      Live Births              Family History  Problem Relation Age of Onset  . Hyperlipidemia Mother   . Heart disease Mother        bicuspid mitral  valve  . Hyperlipidemia Maternal Grandmother   . Diabetes Maternal Grandmother   . Kidney disease Maternal Grandmother   . Hyperlipidemia Maternal Grandfather   . Heart disease Maternal Grandfather 65       MI  . Prostate cancer Maternal Grandfather   . Hypertension Father   . Dementia Father   . Seizures Father        s/p TBI  . Hypertension Paternal Grandfather   . Graves' disease Sister   . Obesity Brother   . Prostate cancer Other        maternal and paternal grandparents  . Heart attack Maternal Aunt        deceased age 51  . Heart disease Maternal Aunt   . Colon cancer Neg Hx   . Esophageal cancer Neg Hx     Social History   Tobacco Use  . Smoking status: Former Smoker    Packs/day: 0.25    Years: 6.00    Pack years: 1.50    Types: Cigarettes  . Smokeless tobacco: Never Used  . Tobacco comment: quit 2010  Substance Use Topics  . Alcohol use: Yes    Comment: Ocassionally  . Drug use: No    Home Medications Prior to Admission medications   Medication Sig Start Date End Date Taking? Authorizing Provider  acetaminophen (TYLENOL) 500 MG tablet Take 1,000 mg by mouth every 6 (six) hours as needed for mild pain or moderate pain.    [provider]  ALPRAZolam Duanne Moron) 0.25 MG tablet Take 1 tablet (0.25 mg total) by mouth 2 (two) times daily as needed for anxiety or sleep. 09/14/18   [name], MD  cetirizine (ZYRTEC) 10 MG tablet Take 1 tablet (10 mg total) by mouth daily. Patient taking differently: Take 10 mg by mouth daily as needed for allergies.  06/21/16   Shelda Pal, DO  cholecalciferol (VITAMIN D3) 25 MCG (1000 UT) tablet Take 5,000 Units by mouth daily.    [provider]  ELDERBERRY PO Take by mouth daily.    [provider]  fluticasone (FLONASE) 50 MCG/ACT nasal spray Place 2 sprays into both nostrils daily. 06/21/16   Shelda Pal, DO  hyoscyamine (LEVSIN SL) 0.125 MG SL tablet Place 1 tablet (0.125 mg  total) under the tongue every 4 (four) hours as needed. 12/18/18   [name], MD  lisinopril (ZESTRIL) 5 MG tablet Take 1 tablet (5 mg total) by mouth daily. Patient not taking: Reported on 11/07/2019 04/25/19   Eber Jones, MD  meclizine (ANTIVERT) 25 MG tablet Take 1 tablet (25 mg total) by mouth 3 (three) times daily as needed  for dizziness. 01/15/19   Isla Pence, MD  methylPREDNISolone (MEDROL) 4 MG tablet 5 tab po qd X 1d then 4 tab po qd X 1d then 3 tab po qd X 1d then 2 tab po qd then 1 tab po qd Patient not taking: Reported on 11/07/2019 09/25/19   [name], MD  metoprolol tartrate (LOPRESSOR) 25 MG tablet Take 0.5 tablets (12.5 mg total) by mouth 2 (two) times daily. 11/25/19   Charlesetta Shanks, MD  Multiple Vitamins-Minerals (MULTIVITAMIN WITH MINERALS) tablet Take 1 tablet by mouth daily.    [provider]  potassium chloride SA (KLOR-CON) 20 MEQ tablet Take 1 tablet (20 mEq total) by mouth daily. 11/25/19   Charlesetta Shanks, MD  promethazine (PHENERGAN) 25 MG tablet Take 1 tablet (25 mg total) by mouth every 6 (six) hours as needed for nausea or vomiting. 01/15/19   Isla Pence, MD  SYNTHROID 175 MCG tablet TAKE 1 TABLET BY MOUTH DAILY BEFORE BREAKFAST 07/25/19   [name], MD  tiZANidine (ZANAFLEX) 2 MG tablet Take 0.5-2 tablets (1-4 mg total) by mouth 2 (two) times daily as needed for muscle spasms. 09/25/19   [name], MD  valACYclovir (VALTREX) 1000 MG tablet Take 1 tablet (1,000 mg total) by mouth 2 (two) times daily. 08/10/19   [name], MD  Vitamin D, Ergocalciferol, (DRISDOL) 1.25 MG (50000 UT) CAPS capsule Take 1 capsule (50,000 Units total) by mouth every 7 (seven) days. 07/25/19   [name], MD    Allergies    Zofran Alvis Lemmings hcl], Amoxicillin, Codeine, Imitrex [sumatriptan], and Ultram [tramadol hcl]  Review of Systems   Review of Systems 10 Systems reviewed and are negative for acute change except as noted in the  HPI. Physical Exam Updated Vital Signs BP 110/81   Pulse 93   Temp 98.8 F (37.1 C) (Oral)   Resp (!) 21   Ht 5\' 4"  (1.626 m)   Wt 85.7 kg   SpO2 98%   BMI 32.44 kg/m   Physical Exam Constitutional:      Comments: Patient is alert and nontoxic.  Clinically well in appearance.  Well-nourished well-developed.  HENT:     Head: Normocephalic and atraumatic.  Eyes:     Extraocular Movements: Extraocular movements intact.     Conjunctiva/sclera: Conjunctivae normal.  Neck:     Comments: No adenopathy no thyromegaly Cardiovascular:     Pulses: Normal pulses.     Heart sounds: Normal heart sounds.     Comments: Heart regular.  Occasional ectopic beat.  Monitor shows normal sinus rhythm low 90s with occasional PVC monomorphic. Pulmonary:     Effort: Pulmonary effort is normal.     Breath sounds: Normal breath sounds.  Abdominal:     General: There is no distension.     Palpations: Abdomen is soft.     Tenderness: There is no abdominal tenderness. There is no guarding.  Musculoskeletal:        General: No swelling or tenderness. Normal range of motion.     Cervical back: Neck supple. No tenderness.     Right lower leg: No edema.     Left lower leg: No edema.  Skin:    General: Skin is warm and dry.  Neurological:     General: No focal deficit present.     Mental Status: She is oriented to person, place, and time.     Coordination: Coordination normal.  Psychiatric:  Mood and Affect: Mood normal.     ED Results / Procedures / Treatments   Labs (all labs ordered are listed, but only abnormal results are displayed) Labs Reviewed  BASIC METABOLIC PANEL - Abnormal; Notable for the following components:      Result Value   Potassium 3.4 (*)    All other components within normal limits  CBC WITH DIFFERENTIAL/PLATELET - Abnormal; Notable for the following components:   WBC 12.9 (*)    Platelets 422 (*)    Neutro Abs 8.0 (*)    All other components within normal  limits  MAGNESIUM  TSH  TROPONIN I (HIGH SENSITIVITY)  TROPONIN I (HIGH SENSITIVITY)    EKG None Muse will not allow opening and interpretation of EKG. Sinus rhythm 94 PR 129 QRS 88 Axis 75.  No acute ischemic changes.  Occasional monomorphic PVC.  Unchanged from previous except the occasional PVC. Radiology DG Chest 2 View  Result Date: 11/25/2019 CLINICAL DATA:  Palpitations. EXAM: CHEST - 2 VIEW COMPARISON:  None. FINDINGS: The heart size and mediastinal contours are within normal limits. Both lungs are clear. The visualized skeletal structures are unremarkable. IMPRESSION: No active cardiopulmonary disease. Electronically Signed   By: Virgina Norfolk M.D.   On: 11/25/2019 20:48    Procedures Procedures (including critical care time)  Medications Ordered in ED Medications  metoprolol tartrate (LOPRESSOR) 50 MG tablet (has no administration in time range)  potassium chloride SA (KLOR-CON) CR tablet 40 mEq (40 mEq Oral Given 11/25/19 2121)  metoprolol tartrate (LOPRESSOR) tablet 12.5 mg (12.5 mg Oral Given 11/25/19 2317)    ED Course  I have reviewed the triage vital signs and the nursing notes.  Pertinent labs & imaging results that were available during my care of the patient were reviewed by me and considered in my medical decision making (see chart for details).    MDM Rules/Calculators/A&P                      Patient presents with PVCs with increasing frequency over the past several weeks.  They are symptomatic.  She however is not having chest pain or near syncope.  History of present illness and review of systems negative for etiology.  Patient does not consume caffeine, smoke or drink alcohol.  No recent addition of OTC meds that are suspicious.  Patient's potassium was mildly low.  We will have her take potassium supplement.  Patient does have hypothyroidism and takes Synthroid regularly.  TSH is pending.  At this time do not feel that would change management.  She does  see her physician regularly for management of her thyroid medications.  At this time will add low-dose metoprolol as heart rate is in the 90s and patient is having PVCs.  Return precautions and follow-up plan reviewed. Final Clinical Impression(s) / ED Diagnoses Final diagnoses:  Palpitations  PVC (premature ventricular contraction)  Hypokalemia    Rx / DC Orders ED Discharge Orders         Ordered    metoprolol tartrate (LOPRESSOR) 25 MG tablet  2 times daily,   Status:  Discontinued     11/25/19 2307    potassium chloride SA (KLOR-CON) 20 MEQ tablet  Daily,   Status:  Discontinued     11/25/19 2307    metoprolol tartrate (LOPRESSOR) 25 MG tablet  2 times daily     11/25/19 2311    potassium chloride SA (KLOR-CON) 20 MEQ tablet  Daily  11/25/19 2311           Charlesetta Shanks, MD 11/25/19 2321

## 2019-11-26 LAB — TSH: TSH: 3.798 u[IU]/mL (ref 0.350–4.500)

## 2019-11-27 ENCOUNTER — Ambulatory Visit: Payer: No Typology Code available for payment source | Admitting: Physical Therapy

## 2019-11-28 ENCOUNTER — Other Ambulatory Visit: Payer: Self-pay | Admitting: Family Medicine

## 2019-11-28 ENCOUNTER — Encounter: Payer: Self-pay | Admitting: Family Medicine

## 2019-11-28 DIAGNOSIS — R739 Hyperglycemia, unspecified: Secondary | ICD-10-CM

## 2019-11-28 DIAGNOSIS — I10 Essential (primary) hypertension: Secondary | ICD-10-CM

## 2019-11-28 DIAGNOSIS — R002 Palpitations: Secondary | ICD-10-CM

## 2019-11-28 DIAGNOSIS — E785 Hyperlipidemia, unspecified: Secondary | ICD-10-CM

## 2019-12-03 ENCOUNTER — Encounter: Payer: No Typology Code available for payment source | Admitting: Physical Therapy

## 2019-12-05 ENCOUNTER — Encounter: Payer: Self-pay | Admitting: General Practice

## 2019-12-11 ENCOUNTER — Encounter: Payer: No Typology Code available for payment source | Admitting: Physical Therapy

## 2019-12-20 ENCOUNTER — Encounter: Payer: Self-pay | Admitting: Family Medicine

## 2019-12-20 ENCOUNTER — Other Ambulatory Visit (HOSPITAL_COMMUNITY)
Admission: RE | Admit: 2019-12-20 | Discharge: 2019-12-20 | Disposition: A | Discharge status: Home / Self Care | Payer: No Typology Code available for payment source | Source: Ambulatory Visit | Attending: Family Medicine | Admitting: Family Medicine

## 2019-12-20 ENCOUNTER — Other Ambulatory Visit: Payer: Self-pay

## 2019-12-20 ENCOUNTER — Ambulatory Visit (INDEPENDENT_AMBULATORY_CARE_PROVIDER_SITE_OTHER): Payer: No Typology Code available for payment source | Admitting: Family Medicine

## 2019-12-20 VITALS — BP 135/78 | HR 87 | Wt 192.0 lb

## 2019-12-20 DIAGNOSIS — N898 Other specified noninflammatory disorders of vagina: Secondary | ICD-10-CM | POA: Insufficient documentation

## 2019-12-20 DIAGNOSIS — K130 Diseases of lips: Secondary | ICD-10-CM

## 2019-12-20 MED ORDER — NYSTATIN 100000 UNIT/GM EX POWD
1.0000 | Freq: Three times a day (TID) | CUTANEOUS | 12 refills | Status: DC | PRN
Start: 2019-12-20 — End: 2020-07-17

## 2019-12-20 MED ORDER — CLOTRIMAZOLE 1 % EX CREA: 1.0000 "application " | TOPICAL_CREAM | Freq: Two times a day (BID) | CUTANEOUS | 0 refills | Status: DC

## 2019-12-20 MED FILL — NYSTATIN 100,000 UNIT/GM PO: 100000 | 15 days supply | Qty: 60 | Fill #0

## 2019-12-20 MED FILL — ANTIFUNGAL CLOTRIMAZOLE 1 %: 1 | 15 days supply | Qty: 28 | Fill #0

## 2019-12-20 NOTE — Progress Notes (Signed)
Vaginal discomfort for about one week. Pt has not tried any OTC regiments. She reports seeing a rash in the area. Pt also stated that she noticed a creamy discharge about a week ago but has subsided.

## 2019-12-20 NOTE — Progress Notes (Signed)
   Subjective:    Patient ID: Katie Henry, female    DOB: 26-Oct-1970, 49 y.o.   MRN: [id number]  HPI Patient seen for vaginal irritation and itching. Walks several times a week and sweats during the activity. Developed rash on left vulvar crease a few days ago.    Review of Systems     Objective:   Physical Exam Vitals reviewed. Exam conducted with a chaperone present.  Constitutional:      Appearance: Normal appearance.  Genitourinary:   Skin:    Capillary Refill: Capillary refill takes less than 2 seconds.  Neurological:     Mental Status: She is alert.        Assessment & Plan:  1. Vaginal irritation - Cervicovaginal ancillary only  2. Intertrigo labialis Clotrimazole bid until resolves. Nystatin powder following that to help with moisture control.

## 2019-12-21 ENCOUNTER — Other Ambulatory Visit: Payer: Self-pay | Admitting: Family Medicine

## 2019-12-21 LAB — CERVICOVAGINAL ANCILLARY ONLY
Bacterial Vaginitis (gardnerella): NEGATIVE
Candida Glabrata: NEGATIVE
Candida Vaginitis: POSITIVE — AB
Chlamydia: NEGATIVE
Comment: NEGATIVE
Comment: NEGATIVE
Comment: NEGATIVE
Comment: NEGATIVE
Comment: NEGATIVE
Comment: NORMAL
Neisseria Gonorrhea: NEGATIVE
Trichomonas: NEGATIVE

## 2019-12-21 MED ORDER — FLUCONAZOLE 150 MG PO TABS
150.0000 mg | ORAL_TABLET | Freq: Once | ORAL | 0 refills | Status: AC
Start: 2019-12-21 — End: 2019-12-21

## 2019-12-24 ENCOUNTER — Other Ambulatory Visit: Payer: Self-pay

## 2019-12-24 DIAGNOSIS — B379 Candidiasis, unspecified: Secondary | ICD-10-CM

## 2019-12-24 MED ORDER — FLUCONAZOLE 150 MG PO TABS: 150.0000 mg | ORAL_TABLET | Freq: Once | ORAL | 0 refills | Status: AC

## 2020-01-29 ENCOUNTER — Ambulatory Visit (HOSPITAL_BASED_OUTPATIENT_CLINIC_OR_DEPARTMENT_OTHER): Payer: No Typology Code available for payment source

## 2020-02-12 MED FILL — SYNTHROID 175 MCG TABLET: 175 | 30 days supply | Qty: 30 | Fill #5

## 2020-02-14 MED FILL — VIT D2 1.25 MG (50,000 UNIT: 1.25 MG | 28 days supply | Qty: 4 | Fill #3

## 2020-03-20 MED FILL — SYNTHROID 175 MCG TABLET: 175 | 30 days supply | Qty: 30 | Fill #6

## 2020-06-19 ENCOUNTER — Encounter: Payer: Self-pay | Admitting: Family Medicine

## 2020-06-19 ENCOUNTER — Other Ambulatory Visit: Payer: Self-pay | Admitting: Family Medicine

## 2020-06-19 DIAGNOSIS — R002 Palpitations: Secondary | ICD-10-CM

## 2020-06-19 DIAGNOSIS — I10 Essential (primary) hypertension: Secondary | ICD-10-CM

## 2020-07-13 NOTE — Progress Notes (Signed)
Cardiology Office Note:    Date:  07/17/2020   ID:  Katie Henry, DOB 10-17-1970, MRN [id number]  PCP:  [name], MD  Cardiologist:  No primary care provider on file.  Electrophysiologist:  None   Referring MD: [name], MD   Chief Complaint  Patient presents with  . Palpitations    History of Present Illness:    Katie Henry is a 49 y.o. female with a hx of Graves' disease, hypertension who is referred by Dr. Charlett Blake for evaluation of palpitations and hypertension.  She was seen in the ED for palpitations on 11/25/2019.  Noted to have PVCs, was started on metoprolol 12.5 mg twice daily.  Reports palpitations improved and she has not been taking metoprolol.  States that episodes of palpitations typically last for few seconds, though was lasting longer in May.  Feels like heart is pounding in chest and skipping beats.  Has occurred 2-3 times over the last 2 weeks.  Denies any chest pain, dyspnea, lightheadedness, syncope, lower extremity edema.  Has been under a lot of stress as husband has been diagnosed with cancer.  She was on lisinopril and metoprolol but has stopped taking.  She has BP monitor at home but has not been checking her BP.  She smoked less than 1 pack/week for 4 to 5 years, quit in 2013.  Family history includes mother has bicuspid aortic valve.  Her maternal aunt died of cardiac arrest at age 52, and her daughter had cardiac arrest at age 56 and was diagnosed with long QT syndrome.    Past Medical History:  Diagnosis Date  . Anxiety   . Back pain   . Dental infection 05/28/2011  . Endometriosis   . Fainting episodes    Dr Luther Parody related  . Graves disease   . Grief reaction 11/15/2016  . History of kidney stones 2003   onset with pregnacy   . History of UTI    last 4-5-weeks ago  . Hyperglycemia 11/15/2016   cbg 100  . Hyperthyroidism   . 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 IBS (irritable bowel syndrome)   . Migraine   .  Syncope   . Vitamin D deficiency     Past Surgical History:  Procedure Laterality Date  .  treatment  1998  . APPENDECTOMY  1991  . CYSTOSCOPY/URETEROSCOPY/HOLMIUM LASER/STENT PLACEMENT Left 08/09/2017   Procedure: CYSTOSCOPY/RETROGRADE/URETEROSCOPY/HOLMIUM LASER/STENT PLACEMENT;  Surgeon: Festus Aloe, MD;  Location: [facility];  Service: Urology;  Laterality: Left;  ONLY NEEDS 60 MIN  . ENDOMETRIAL ABLATION  01/2007  . EXTRACORPOREAL SHOCK WAVE LITHOTRIPSY Left 07/21/2017   Procedure: LEFT EXTRACORPOREAL SHOCK WAVE LITHOTRIPSY (ESWL);  Surgeon: Bjorn Loser, MD;  Location: [facility];  Service: Urology;  Laterality: Left;  Marland Kitchen VAGINAL HYSTERECTOMY  2010   ovaries still in place    Current Medications: Current Meds  Medication Sig  . acetaminophen (TYLENOL) 500 MG tablet Take 1,000 mg by mouth every 6 (six) hours as needed for mild pain or moderate pain.  Marland Kitchen amLODipine (NORVASC) 5 MG tablet Take 1 tablet (5 mg total) by mouth daily.  . cetirizine (ZYRTEC) 10 MG tablet Take 1 tablet (10 mg total) by mouth daily. (Patient taking differently: Take 10 mg by mouth daily as needed for allergies.)  . cholecalciferol (VITAMIN D3) 25 MCG (1000 UT) tablet Take 5,000 Units by mouth daily.  . clotrimazole (LOTRIMIN) 1 % cream Apply 1 application topically 2 (two) times daily.  Marland Kitchen  fluticasone (FLONASE) 50 MCG/ACT nasal spray Place 2 sprays into both nostrils daily.  . meclizine (ANTIVERT) 25 MG tablet Take 1 tablet (25 mg total) by mouth 3 (three) times daily as needed for dizziness.  . Multiple Vitamins-Minerals (MULTIVITAMIN WITH MINERALS) tablet Take 1 tablet by mouth daily.  Marland Kitchen SYNTHROID 175 MCG tablet TAKE 1 TABLET BY MOUTH DAILY BEFORE BREAKFAST  . valACYclovir (VALTREX) 1000 MG tablet Take 1 tablet (1,000 mg total) by mouth 2 (two) times daily.  . Vitamin D, Ergocalciferol, (DRISDOL) 1.25 MG (50000 UT) CAPS capsule Take 1 capsule (50,000 Units total) by mouth every 7 (seven) days.     Allergies:    Zofran [ondansetron hcl], Amoxicillin, Codeine, Imitrex [sumatriptan], and Ultram [tramadol hcl]   Social History   Socioeconomic History  . Marital status: Married    Spouse name: Mckenlie Grandjean  . Number of children: 2  . Years of education: 81  . Highest education level: Not on file  Occupational History    Employer: [employer]  Tobacco Use  . Smoking status: Former Smoker    Packs/day: 0.25    Years: 6.00    Pack years: 1.50    Types: Cigarettes  . Smokeless tobacco: Never Used  . Tobacco comment: quit 2010  Vaping Use  . Vaping Use: Never used  Substance and Sexual Activity  . Alcohol use: Yes    Comment: Ocassionally  . Drug use: No  . Sexual activity: Yes    Partners: Male    Comment: work at [employer], lives with fiance and son  Other Topics Concern  . Not on file  Social History Narrative   Engaged, two sons.   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   No Tob, occ alcohol, no drugs.   Social Determinants of Health   Financial Resource Strain: Not on file  Food Insecurity: Not on file  Transportation Needs: Not on file  Physical Activity: Not on file  Stress: Not on file  Social Connections: Not on file     Family History: The patient's family history includes Dementia in her father; Diabetes in her maternal grandmother; Berenice Primas' disease in her sister; Heart attack in her maternal aunt; Heart disease in her maternal aunt and mother; Heart disease (age of onset: 70) in her maternal grandfather; Hyperlipidemia in her maternal grandfather, maternal grandmother, and mother; Hypertension in her father and paternal grandfather; Kidney disease in her maternal grandmother; Obesity in her brother; Prostate cancer in her maternal grandfather and another family member; Seizures in her father. There is no history of Colon cancer or Esophageal cancer.  ROS:   Please see the history of present illness.    All other systems reviewed  and are negative.  EKGs/Labs/Other Studies Reviewed:    The following studies were reviewed today:  EKG:  EKG is  ordered today.  The ekg ordered today demonstrates normal sinus rhythm, rate 75, QTc 437, no ST/T abnormalities  Recent Labs: 07/24/2019: ALT 18 11/25/2019: BUN 14; Creatinine, Ser 0.68; Hemoglobin 15.0; Magnesium 2.0; Platelets 422; Potassium 3.4; Sodium 137; TSH 3.798  Recent Lipid Panel    Component Value Date/Time   CHOL 246 (H) 07/24/2019 1029   CHOL 230 (H) 02/15/2019 1152   TRIG 252.0 (H) 07/24/2019 1029   HDL 54.60 07/24/2019 1029   HDL 56 02/15/2019 1152   CHOLHDL 4 07/24/2019 1029   VLDL 50.4 (H) 07/24/2019 1029   LDLCALC 140 (H) 02/15/2019 1152   LDLDIRECT  167.0 07/24/2019 1029    Physical Exam:    VS:  BP (!) 152/90 (BP Location: Left Arm, Patient Position: Sitting)   Ht 5\' 4"  (1.626 m)   Wt 205 lb (93 kg)   SpO2 95%   BMI 35.19 kg/m     Wt Readings from Last 3 Encounters:  07/17/20 205 lb (93 kg)  12/20/19 192 lb (87.1 kg)  11/25/19 189 lb (85.7 kg)     GEN: Well nourished, well developed in no acute distress HEENT: Normal NECK: No JVD; No carotid bruits LYMPHATICS: No lymphadenopathy CARDIAC: RRR, no murmurs, rubs, gallops RESPIRATORY:  Clear to auscultation without rales, wheezing or rhonchi  ABDOMEN: Soft, non-tender, non-distended MUSCULOSKELETAL:  No edema; No deformity  SKIN: Warm and dry NEUROLOGIC:  Alert and oriented x 3 PSYCHIATRIC:  Normal affect   ASSESSMENT:    1. Palpitations   2. Family history of bicuspid aortic valve   3. Essential hypertension    PLAN:    Palpitations: Description concerning for arrhythmia, will check Zio patch x2 weeks.  Family history of bicuspid aortic valve: Reports mother diagnosed with BAV.  Will check echocardiogram to screen for bicuspid aortic valve  Hypertension: BP elevated, will start amlodipine 5 mg daily.  Asked patient to check BP twice daily for next 2 weeks and call with  results  Hypothyroidism: History of Graves' disease status post radioactive iodine, now hypothyroid on Synthroid 175 mg daily.  Most recent TSH 3.8 in 11/2019   RTC in 3 months    Medication Adjustments/Labs and Tests Ordered: Current medicines are reviewed at length with the patient today.  Concerns regarding medicines are outlined above.  Orders Placed This Encounter  Procedures  . LONG TERM MONITOR (3-14 DAYS)  . EKG 12-Lead  . ECHOCARDIOGRAM COMPLETE   Meds ordered this encounter  Medications  . amLODipine (NORVASC) 5 MG tablet    Sig: Take 1 tablet (5 mg total) by mouth daily.    Dispense:  90 tablet    Refill:  3    Patient Instructions  Medication Instructions:  START amlodipine 5 mg daily  Please check your blood pressure at home daily, write it down.  Call the office or send message via Mychart with the readings in 2 weeks for Dr. Gardiner Rhyme to review.   Testing/Procedures: Your physician has requested that you have an echocardiogram. Echocardiography is a painless test that uses sound waves to create images of your heart. It provides your doctor with information about the size and shape of your heart and how well your heart's chambers and valves are working. This procedure takes approximately one hour. There are no restrictions for this procedure.   ZIO XT- Long Term Monitor Instructions   Your physician has requested you wear your ZIO patch monitor 14 days.   This is a single patch monitor.  [employer] supplies one patch monitor per enrollment.  Additional stickers are not available.   Please do not apply patch if you will be having a Nuclear Stress Test, Echocardiogram, Cardiac CT, MRI, or Chest Xray during the time frame you would be wearing the monitor. The patch cannot be worn during these tests.  You cannot remove and re-apply the ZIO XT patch monitor.   Your ZIO patch monitor will be sent USPS Priority mail from [facility] directly to your home address.  The monitor may also be mailed to a PO BOX if home delivery is not available.   It may take 3-5 days to receive  your monitor after you have been enrolled.   Once you have received you monitor, please review enclosed instructions.  Your monitor has already been registered assigning a specific monitor serial # to you.   Applying the monitor   Shave hair from upper left chest.   Hold abrader disc by orange tab.  Rub abrader in 40 strokes over left upper chest as indicated in your monitor instructions.   Clean area with 4 enclosed alcohol pads .  Use all pads to assure are is cleaned thoroughly.  Let dry.   Apply patch as indicated in monitor instructions.  Patch will be place under collarbone on left side of chest with arrow pointing upward.   Rub patch adhesive wings for 2 minutes.Remove white label marked "1".  Remove white label marked "2".  Rub patch adhesive wings for 2 additional minutes.   While looking in a mirror, press and release button in center of patch.  A small green light will flash 3-4 times .  This will be your only indicator the monitor has been turned on.     Do not shower for the first 24 hours.  You may shower after the first 24 hours.   Press button if you feel a symptom. You will hear a small click.  Record Date, Time and Symptom in the Patient Log Book.   When you are ready to remove patch, follow instructions on last 2 pages of Patient Log Book.  Stick patch monitor onto last page of Patient Log Book.   Place Patient Log Book in [place] box.  Use locking tab on box and tape box closed securely.  The Orange and [employer] has [employer] on it.  Please place in mailbox as soon as possible.  Your physician should have your test results approximately 7 days after the monitor has been mailed back to [facility].   Call [facility] at [phone] if you have questions regarding your ZIO XT patch monitor.  Call them immediately if you see an orange  light blinking on your monitor.   If your monitor falls off in less than 4 days contact our Monitor department at [phone].  If your monitor becomes loose or falls off after 4 days call [employer] at [phone] for suggestions on securing your monitor.   Follow-Up: At [facility], you and your health needs are our priority.  As part of our continuing mission to provide you with exceptional heart care, we have created designated Provider Care Teams.  These Care Teams include your primary Cardiologist (physician) and Advanced Practice Providers (APPs -  Physician Assistants and Nurse Practitioners) who all work together to provide you with the care you need, when you need it.  We recommend signing up for the patient portal called "MyChart".  Sign up information is provided on this After Visit Summary.  MyChart is used to connect with patients for Virtual Visits (Telemedicine).  Patients are able to view lab/test results, encounter notes, upcoming appointments, etc.  Non-urgent messages can be sent to your provider as well.   To learn more about what you can do with MyChart, go to [email or web address].    Your next appointment:   3 month(s)  The format for your next appointment:   In Person  Provider:   Oswaldo Milian, MD       Signed, Donato Heinz, MD  07/17/2020 1:14 PM    [place]

## 2020-07-17 ENCOUNTER — Ambulatory Visit (INDEPENDENT_AMBULATORY_CARE_PROVIDER_SITE_OTHER): Payer: No Typology Code available for payment source

## 2020-07-17 ENCOUNTER — Encounter: Payer: Self-pay | Admitting: Cardiology

## 2020-07-17 ENCOUNTER — Ambulatory Visit: Payer: No Typology Code available for payment source | Admitting: Cardiology

## 2020-07-17 ENCOUNTER — Other Ambulatory Visit: Payer: Self-pay | Admitting: Cardiology

## 2020-07-17 ENCOUNTER — Other Ambulatory Visit: Payer: Self-pay

## 2020-07-17 ENCOUNTER — Encounter: Payer: Self-pay | Admitting: *Deleted

## 2020-07-17 VITALS — BP 152/90 | Ht 64.0 in | Wt 205.0 lb

## 2020-07-17 DIAGNOSIS — Z8279 Family history of other congenital malformations, deformations and chromosomal abnormalities: Secondary | ICD-10-CM | POA: Diagnosis not present

## 2020-07-17 DIAGNOSIS — I10 Essential (primary) hypertension: Secondary | ICD-10-CM

## 2020-07-17 DIAGNOSIS — R002 Palpitations: Secondary | ICD-10-CM | POA: Diagnosis not present

## 2020-07-17 MED ORDER — AMLODIPINE BESYLATE 5 MG PO TABS
5.0000 mg | ORAL_TABLET | Freq: Every day | ORAL | 3 refills | Status: DC
Start: 2020-07-17 — End: 2020-07-17

## 2020-07-17 NOTE — Patient Instructions (Signed)
Medication Instructions:  START amlodipine 5 mg daily  Please check your blood pressure at home daily, write it down.  Call the office or send message via Mychart with the readings in 2 weeks for Dr. Bjorn Pippin to review.   Testing/Procedures: Your physician has requested that you have an echocardiogram. Echocardiography is a painless test that uses sound waves to create images of your heart. It provides your doctor with information about the size and shape of your heart and how well your hearts chambers and valves are working. This procedure takes approximately one hour. There are no restrictions for this procedure.   ZIO XT- Long Term Monitor Instructions   Your physician has requested you wear your ZIO patch monitor 14 days.   This is a single patch monitor.  [employer] supplies one patch monitor per enrollment.  Additional stickers are not available.   Please do not apply patch if you will be having a Nuclear Stress Test, Echocardiogram, Cardiac CT, MRI, or Chest Xray during the time frame you would be wearing the monitor. The patch cannot be worn during these tests.  You cannot remove and re-apply the ZIO XT patch monitor.   Your ZIO patch monitor will be sent USPS Priority mail from [facility] directly to your home address. The monitor may also be mailed to a PO BOX if home delivery is not available.   It may take 3-5 days to receive your monitor after you have been enrolled.   Once you have received you monitor, please review enclosed instructions.  Your monitor has already been registered assigning a specific monitor serial # to you.   Applying the monitor   Shave hair from upper left chest.   Hold abrader disc by orange tab.  Rub abrader in 40 strokes over left upper chest as indicated in your monitor instructions.   Clean area with 4 enclosed alcohol pads .  Use all pads to assure are is cleaned thoroughly.  Let dry.   Apply patch as indicated in monitor instructions.   Patch will be place under collarbone on left side of chest with arrow pointing upward.   Rub patch adhesive wings for 2 minutes.Remove white label marked "1".  Remove white label marked "2".  Rub patch adhesive wings for 2 additional minutes.   While looking in a mirror, press and release button in center of patch.  A small green light will flash 3-4 times .  This will be your only indicator the monitor has been turned on.     Do not shower for the first 24 hours.  You may shower after the first 24 hours.   Press button if you feel a symptom. You will hear a small click.  Record Date, Time and Symptom in the Patient Log Book.   When you are ready to remove patch, follow instructions on last 2 pages of Patient Log Book.  Stick patch monitor onto last page of Patient Log Book.   Place Patient Log Book in [place] box.  Use locking tab on box and tape box closed securely.  The Orange and [employer] has [employer] on it.  Please place in mailbox as soon as possible.  Your physician should have your test results approximately 7 days after the monitor has been mailed back to [facility].   Call [facility] Customer Care at [phone] if you have questions regarding your ZIO XT patch monitor.  Call them immediately if you see an orange light blinking on your  monitor.   If your monitor falls off in less than 4 days contact our Monitor department at [phone].  If your monitor becomes loose or falls off after 4 days call [employer] at [phone] for suggestions on securing your monitor.   Follow-Up: At [facility], you and your health needs are our priority.  As part of our continuing mission to provide you with exceptional heart care, we have created designated Provider Care Teams.  These Care Teams include your primary Cardiologist (physician) and Advanced Practice Providers (APPs -  Physician Assistants and Nurse Practitioners) who all work together to provide you with the care you need,  when you need it.  We recommend signing up for the patient portal called "MyChart".  Sign up information is provided on this After Visit Summary.  MyChart is used to connect with patients for Virtual Visits (Telemedicine).  Patients are able to view lab/test results, encounter notes, upcoming appointments, etc.  Non-urgent messages can be sent to your provider as well.   To learn more about what you can do with MyChart, go to [email or web address].    Your next appointment:   3 month(s)  The format for your next appointment:   In Person  Provider:   Oswaldo Milian, MD

## 2020-07-17 NOTE — Progress Notes (Signed)
Patient ID: Katie Henry, female   DOB: 1970-08-18, 49 y.o.   MRN: [id number] Patient enrolled for [employer] to ship a 14 day ZIO XT long term holter monitor to her home.

## 2020-07-23 DIAGNOSIS — R002 Palpitations: Secondary | ICD-10-CM

## 2020-08-06 ENCOUNTER — Ambulatory Visit (HOSPITAL_COMMUNITY): Payer: No Typology Code available for payment source | Attending: Cardiovascular Disease

## 2020-08-06 ENCOUNTER — Other Ambulatory Visit: Payer: Self-pay

## 2020-08-06 DIAGNOSIS — R002 Palpitations: Secondary | ICD-10-CM | POA: Insufficient documentation

## 2020-08-06 DIAGNOSIS — Z8279 Family history of other congenital malformations, deformations and chromosomal abnormalities: Secondary | ICD-10-CM | POA: Insufficient documentation

## 2020-08-06 LAB — ECHOCARDIOGRAM COMPLETE
Area-P 1/2: 2.87 cm2
P 1/2 time: 432 msec
S' Lateral: 3.2 cm

## 2020-08-14 ENCOUNTER — Other Ambulatory Visit: Payer: Self-pay | Admitting: Family Medicine

## 2020-10-13 NOTE — Progress Notes (Deleted)
Cardiology Office Note:    Date:  10/13/2020   ID:  Katie Henry, DOB [birth year], MRN [id number]  PCP:  [name], MD  Cardiologist:  No primary care provider on file.  Electrophysiologist:  None   Referring MD: [name], MD   No chief complaint on file.   History of Present Illness:    Katie Henry is a 50 y.o. female with a hx of Graves' disease, hypertension who presents for follow-up.  She was referred by Dr. Charlett Blake for evaluation of palpitations and hypertension, initially seen on 07/17/2020.  She was seen in the ED for palpitations on 11/25/2019.  Noted to have PVCs, was started on metoprolol 12.5 mg twice daily.  Reports palpitations improved and she has not been taking metoprolol.  States that episodes of palpitations typically last for few seconds, though was lasting longer in May.  Feels like heart is pounding in chest and skipping beats.  Has occurred 2-3 times over the last 2 weeks.  Denies any chest pain, dyspnea, lightheadedness, syncope, lower extremity edema.  Has been under a lot of stress as husband has been diagnosed with cancer.  She was on lisinopril and metoprolol but has stopped taking.  She has BP monitor at home but has not been checking her BP.  She smoked less than 1 pack/week for 4 to 5 years, quit in 2013.  Family history includes mother has bicuspid aortic valve.  Her maternal aunt died of cardiac arrest at age 46, and her daughter had cardiac arrest at age 67 and was diagnosed with long QT syndrome.  Echocardiogram on 08/06/2020 showed normal biventricular function, no significant valvular disease.  Zio patch x14 days on 08/13/2020 showed no significant arrhythmias.  Since last clinic visit,  Past Medical History:  Diagnosis Date  . Anxiety   . Back pain   . Dental infection 05/28/2011  . Endometriosis   . Fainting episodes    Dr Luther Parody related  . Graves disease   . Grief reaction 11/15/2016  . History of kidney stones 2003   onset with  pregnacy   . History of UTI    last 4-5-weeks ago  . Hyperglycemia 11/15/2016   cbg 100  . Hyperthyroidism   . 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 IBS (irritable bowel syndrome)   . Migraine   . Syncope   . Vitamin D deficiency     Past Surgical History:  Procedure Laterality Date  .  treatment  1998  . APPENDECTOMY  1991  . CYSTOSCOPY/URETEROSCOPY/HOLMIUM LASER/STENT PLACEMENT Left 08/09/2017   Procedure: CYSTOSCOPY/RETROGRADE/URETEROSCOPY/HOLMIUM LASER/STENT PLACEMENT;  Surgeon: Festus Aloe, MD;  Location: [facility];  Service: Urology;  Laterality: Left;  ONLY NEEDS 60 MIN  . ENDOMETRIAL ABLATION  01/2007  . EXTRACORPOREAL SHOCK WAVE LITHOTRIPSY Left 07/21/2017   Procedure: LEFT EXTRACORPOREAL SHOCK WAVE LITHOTRIPSY (ESWL);  Surgeon: Bjorn Loser, MD;  Location: [facility];  Service: Urology;  Laterality: Left;  Marland Kitchen VAGINAL HYSTERECTOMY  2010   ovaries still in place    Current Medications: No outpatient medications have been marked as taking for the 10/14/20 encounter (Appointment) with Donato Heinz, MD.     Allergies:   Zofran Alvis Lemmings hcl], Amoxicillin, Codeine, Imitrex [sumatriptan], and Ultram [tramadol hcl]   Social History   Socioeconomic History  . Marital status: Married    Spouse name: Dinara Lupu  . Number of children: 2  . Years of education: 69  . Highest education level: Not on  file  Occupational History    Employer: [employer]  Tobacco Use  . Smoking status: Former Smoker    Packs/day: 0.25    Years: 6.00    Pack years: 1.50    Types: Cigarettes  . Smokeless tobacco: Never Used  . Tobacco comment: quit 2010  Vaping Use  . Vaping Use: Never used  Substance and Sexual Activity  . Alcohol use: Yes    Comment: Ocassionally  . Drug use: No  . Sexual activity: Yes    Partners: Male    Comment: work at [employer], lives with fiance and son  Other Topics Concern  . Not on file  Social History Narrative   Engaged, two  sons.   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   No Tob, occ alcohol, no drugs.   Social Determinants of Health   Financial Resource Strain: Not on file  Food Insecurity: Not on file  Transportation Needs: Not on file  Physical Activity: Not on file  Stress: Not on file  Social Connections: Not on file     Family History: The patient's family history includes Dementia in her father; Diabetes in her maternal grandmother; Berenice Primas' disease in her sister; Heart attack in her maternal aunt; Heart disease in her maternal aunt and mother; Heart disease (age of onset: 76) in her maternal grandfather; Hyperlipidemia in her maternal grandfather, maternal grandmother, and mother; Hypertension in her father and paternal grandfather; Kidney disease in her maternal grandmother; Obesity in her brother; Prostate cancer in her maternal grandfather and another family member; Seizures in her father. There is no history of Colon cancer or Esophageal cancer.  ROS:   Please see the history of present illness.    All other systems reviewed and are negative.  EKGs/Labs/Other Studies Reviewed:    The following studies were reviewed today:  EKG:  EKG is  ordered today.  The ekg ordered today demonstrates normal sinus rhythm, rate 75, QTc 437, no ST/T abnormalities  Recent Labs: 11/25/2019: BUN 14; Creatinine, Ser 0.68; Hemoglobin 15.0; Magnesium 2.0; Platelets 422; Potassium 3.4; Sodium 137; TSH 3.798  Recent Lipid Panel    Component Value Date/Time   CHOL 246 (H) 07/24/2019 1029   CHOL 230 (H) 02/15/2019 1152   TRIG 252.0 (H) 07/24/2019 1029   HDL 54.60 07/24/2019 1029   HDL 56 02/15/2019 1152   CHOLHDL 4 07/24/2019 1029   VLDL 50.4 (H) 07/24/2019 1029   LDLCALC 140 (H) 02/15/2019 1152   LDLDIRECT 167.0 07/24/2019 1029    Physical Exam:    VS:  There were no vitals taken for this visit.    Wt Readings from Last 3 Encounters:  07/17/20 205 lb (93 kg)   12/20/19 192 lb (87.1 kg)  11/25/19 189 lb (85.7 kg)     GEN: Well nourished, well developed in no acute distress HEENT: Normal NECK: No JVD; No carotid bruits LYMPHATICS: No lymphadenopathy CARDIAC: RRR, no murmurs, rubs, gallops RESPIRATORY:  Clear to auscultation without rales, wheezing or rhonchi  ABDOMEN: Soft, non-tender, non-distended MUSCULOSKELETAL:  No edema; No deformity  SKIN: Warm and dry NEUROLOGIC:  Alert and oriented x 3 PSYCHIATRIC:  Normal affect   ASSESSMENT:    No diagnosis found. PLAN:    Palpitations: Echocardiogram on 08/06/2020 showed normal biventricular function, no significant valvular disease.  Zio patch x14 days on 08/13/2020 showed no significant arrhythmias.  Family history of bicuspid aortic valve: Reports mother diagnosed with BAV.  Not well visualized  on echo 08/06/20 but likely tricuspid.  No AI/AS seen.  Hypertension: On amlodipine 5 mg daily  Hypothyroidism: History of Graves' disease status post radioactive iodine, now hypothyroid on Synthroid 175 mg daily.  Most recent TSH 3.8 in 11/2019   RTC in ***    Medication Adjustments/Labs and Tests Ordered: Current medicines are reviewed at length with the patient today.  Concerns regarding medicines are outlined above.  No orders of the defined types were placed in this encounter.  No orders of the defined types were placed in this encounter.   There are no Patient Instructions on file for this visit.   Signed, Donato Heinz, MD  10/13/2020 4:14 PM     [facility]

## 2020-10-14 ENCOUNTER — Ambulatory Visit: Payer: No Typology Code available for payment source | Admitting: Cardiology

## 2020-10-20 MED FILL — Levothyroxine Sodium Tab 175 MCG: ORAL | 30 days supply | Qty: 30 | Fill #0 | Status: AC

## 2020-10-21 ENCOUNTER — Other Ambulatory Visit (HOSPITAL_COMMUNITY): Payer: Self-pay

## 2020-10-23 ENCOUNTER — Other Ambulatory Visit (HOSPITAL_COMMUNITY): Payer: Self-pay

## 2020-11-06 ENCOUNTER — Telehealth: Payer: Self-pay | Admitting: Family Medicine

## 2020-11-06 ENCOUNTER — Telehealth: Payer: Self-pay | Admitting: *Deleted

## 2020-11-06 ENCOUNTER — Other Ambulatory Visit: Payer: Self-pay | Admitting: Family

## 2020-11-06 ENCOUNTER — Other Ambulatory Visit: Payer: Self-pay | Admitting: Family Medicine

## 2020-11-06 DIAGNOSIS — U071 COVID-19: Secondary | ICD-10-CM

## 2020-11-06 MED ORDER — NIRMATRELVIR/RITONAVIR (PAXLOVID)TABLET: 3.0000 | ORAL_TABLET | Freq: Two times a day (BID) | ORAL | 0 refills | Status: AC

## 2020-11-06 NOTE — Telephone Encounter (Signed)
Caller Katie Henry  Call Back @ [phone]   Patient would like to r/s appointment due to exposure to covid 19, patient state s he would like to see Dr Charlett Blake before November 2022. Please assist in helping me find an appointment for patient. Patient is willing to do a virtual visit  But I don't believe her  insurance pays.

## 2020-11-06 NOTE — Progress Notes (Signed)
Outpatient Oral COVID Treatment Note  I connected with Katie Henry on 11/06/2020/5:40 PM by telephone and verified that I am speaking with the correct person using two identifiers.  I discussed the limitations, risks, security, and privacy concerns of performing an evaluation and management service by telephone and the availability of in person appointments. I also discussed with the patient that there may be a patient responsible charge related to this service. The patient expressed understanding and agreed to proceed.  Patient location: Home  Provider location: [facility]   Diagnosis: COVID-19 infection  Purpose of visit: Discussion of potential use of Molnupiravir or Paxlovid, a new treatment for mild to moderate COVID-19 viral infection in non-hospitalized patients.   Subjective: Patient is a 50 y.o. female who has been diagnosed with COVID 19 viral infection.  Their symptoms began on 11/05/20 with Sore throat, chest discomfort with cough, fatigue. Marland Kitchen    Past Medical History:  Diagnosis Date  . Anxiety   . Back pain   . Dental infection 05/28/2011  . Endometriosis   . Fainting episodes    Dr Luther Parody related  . Graves disease   . Grief reaction 11/15/2016  . History of kidney stones 2003   onset with pregnacy   . History of UTI    last 4-5-weeks ago  . Hyperglycemia 11/15/2016   cbg 100  . Hyperthyroidism   . 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 IBS (irritable bowel syndrome)   . Migraine   . Syncope   . Vitamin D deficiency     Allergies  Allergen Reactions  . Zofran [Ondansetron Hcl] Other (See Comments)    Pt has hx of migraines; medication increases headaches and nausea   . Amoxicillin Nausea And Vomiting and Other (See Comments)    Has patient had a PCN reaction causing immediate rash, facial/tongue/throat swelling, SOB or lightheadedness with hypotension: Unknown Has patient had a PCN reaction causing severe rash involving mucus membranes or  skin necrosis: No Has patient had a PCN reaction that required hospitalization: No Has patient had a PCN reaction occurring within the last 10 years: No If all of the above answers are "NO", then may proceed with Cephalosporin use.   . Codeine Nausea And Vomiting  . Imitrex [Sumatriptan] Nausea And Vomiting  . Ultram [Tramadol Hcl] Nausea And Vomiting     Current Outpatient Medications:  .  acetaminophen (TYLENOL) 500 MG tablet, Take 1,000 mg by mouth every 6 (six) hours as needed for mild pain or moderate pain., Disp: , Rfl:  .  ALPRAZolam (XANAX) 0.25 MG tablet, Take 1 tablet (0.25 mg total) by mouth 2 (two) times daily as needed for anxiety or sleep. (Patient not taking: Reported on 07/17/2020), Disp: 30 tablet, Rfl: 1 .  amLODipine (NORVASC) 5 MG tablet, TAKE 1 TABLET (5 MG TOTAL) BY MOUTH DAILY., Disp: 90 tablet, Rfl: 3 .  cetirizine (ZYRTEC) 10 MG tablet, Take 1 tablet (10 mg total) by mouth daily. (Patient taking differently: Take 10 mg by mouth daily as needed for allergies.), Disp: 30 tablet, Rfl: 0 .  cholecalciferol (VITAMIN D3) 25 MCG (1000 UT) tablet, Take 5,000 Units by mouth daily., Disp: , Rfl:  .  clotrimazole (LOTRIMIN) 1 % cream, Apply 1 application topically 2 (two) times daily., Disp: 30 g, Rfl: 0 .  fluticasone (FLONASE) 50 MCG/ACT nasal spray, Place 2 sprays into both nostrils daily., Disp: 16 g, Rfl: 6 .  hyoscyamine (LEVSIN SL) 0.125 MG SL tablet, Place  1 tablet (0.125 mg total) under the tongue every 4 (four) hours as needed. (Patient not taking: Reported on 07/17/2020), Disp: 30 tablet, Rfl: 0 .  meclizine (ANTIVERT) 25 MG tablet, Take 1 tablet (25 mg total) by mouth 3 (three) times daily as needed for dizziness., Disp: 30 tablet, Rfl: 0 .  Multiple Vitamins-Minerals (MULTIVITAMIN WITH MINERALS) tablet, Take 1 tablet by mouth daily., Disp: , Rfl:  .  SYNTHROID 175 MCG tablet, TAKE 1 TABLET BY MOUTH DAILY BEFORE BREAKFAST, Disp: 30 tablet, Rfl: 3 .  valACYclovir  (VALTREX) 1000 MG tablet, Take 1 tablet (1,000 mg total) by mouth 2 (two) times daily., Disp: 60 tablet, Rfl: 0 .  Vitamin D, Ergocalciferol, (DRISDOL) 1.25 MG (50000 UT) CAPS capsule, Take 1 capsule (50,000 Units total) by mouth every 7 (seven) days., Disp: 4 capsule, Rfl: 4  Objective: Patient appears/sounds ill and hoarse.  They are in no apparent distress.  Breathing is non labored.  Mood and behavior are normal.  Laboratory Data:  No results found for this or any previous visit (from the past 2160 hour(s)).   Assessment: 50 y.o. female with mild/moderate COVID 19 viral infection diagnosed on 11/06/20 at high risk for progression to severe COVID 19.  Plan:  This patient is a 50 y.o. female that meets the following criteria for Emergency Use Authorization of: Paxlovid 1. Age >12 yr AND > 40 kg 2. SARS-COV-2 positive test 3. Symptom onset < 5 days 4. Mild-to-moderate COVID disease with high risk for severe progression to hospitalization or death  I have spoken and communicated the following to the patient or parent/caregiver regarding: 1. Paxlovid is an unapproved drug that is authorized for use under an Emergency Use Authorization.  2. There are no adequate, approved, available products for the treatment of COVID-19 in adults who have mild-to-moderate COVID-19 and are at high risk for progressing to severe COVID-19, including hospitalization or death. 3. Other therapeutics are currently authorized. For additional information on all products authorized for treatment or prevention of COVID-19, please see [employer].  4. There are benefits and risks of taking this treatment as outlined in the "Fact Sheet for Patients and Caregivers."  5. "Fact Sheet for Patients and Caregivers" was reviewed with patient. A hard copy will be provided to patient from pharmacy prior to the patient  receiving treatment. 6. Patients should continue to self-isolate and use infection control measures (e.g., wear mask, isolate, social distance, avoid sharing personal items, clean and disinfect "high touch" surfaces, and frequent handwashing) according to CDC guidelines.  7. The patient or parent/caregiver has the option to accept or refuse treatment. 8. Patient medication history was reviewed for potential drug interactions:No drug interactions 9. Patient's GFR was calculated to be 100, and they were therefore prescribed Normal dose (GFR>60) - nirmatrelvir 150mg  tab (2 tablet) by mouth twice daily AND ritonavir 100mg  tab (1 tablet) by mouth twice daily   After reviewing above information with the patient, the patient agrees to receive Paxlovid.  Follow up instructions:    . Take prescription BID x 5 days as directed . Reach out to pharmacist for counseling on medication if desired . For concerns regarding further COVID symptoms please follow up with your PCP or urgent care . For urgent or life-threatening issues, seek care at your local emergency department  The patient was provided an opportunity to ask questions, and all were answered. The patient agreed with the plan and demonstrated an understanding of the instructions.   Script sent to [facility]  and opted to pick up RX.  The patient was advised to call their PCP or seek an in-person evaluation if the symptoms worsen or if the condition fails to improve as anticipated.   I provided 8 minutes of non face-to-face telephone visit time during this encounter, and > 50% was spent counseling as documented under my assessment & plan.  Mauricio Po, FNP 11/06/2020 /5:40 PM

## 2020-11-06 NOTE — Telephone Encounter (Signed)
I have placed a referral to the covid treatment team. According to her chart she has not had the covid shots so she is a candidate for treatment

## 2020-11-06 NOTE — Telephone Encounter (Signed)
Of course!

## 2020-11-06 NOTE — Telephone Encounter (Signed)
Are you ok with adding her on for a 320pm CPE.  The patient stated she would be ok with an afternoon appt.  We have one for 02/10/21-Tuesday.

## 2020-11-06 NOTE — Telephone Encounter (Signed)
Patient called to ask about if she would qualify for the infusion?  She start feeling bad on yesterday.  Her symptoms are sore throat, cough, chest feels raw and irritated, fatigue, and nausea.    Her and husband was around another couple on Sunday and them and her husband are positive as well.  She initially took a test and was negative, but today it was positive.

## 2020-11-07 ENCOUNTER — Telehealth: Payer: Self-pay | Admitting: *Deleted

## 2020-11-07 NOTE — Telephone Encounter (Signed)
[facility] faxed request for GFR information before filling paxlovid prescription written by Terri Piedra. Per Marya Amsler, patient's GFR was 100. RN relayed to pharmacist. They will fill the prescription. Landis Gandy, RN

## 2020-11-07 NOTE — Telephone Encounter (Signed)
Spoke with patient and she is going to start the Paxlovid. Encouraged increased rest and hydration, add probiotics, zinc such as Coldeze or Xicam. Treat fevers as needed. Pulse oximeter, want oxygen in 90s  Multivitamin with minerals, selenium Vitamin D 1000-2000 IU daily Probiotic with lactobacillus and bifidophilus Asprin EC 81 mg daily  Melatonin 2-5 mg at bedtime

## 2020-11-07 NOTE — Telephone Encounter (Signed)
See below

## 2020-11-11 ENCOUNTER — Encounter: Payer: No Typology Code available for payment source | Admitting: Family Medicine

## 2020-12-01 ENCOUNTER — Other Ambulatory Visit (HOSPITAL_COMMUNITY): Payer: Self-pay

## 2020-12-01 MED FILL — Levothyroxine Sodium Tab 175 MCG: ORAL | 30 days supply | Qty: 30 | Fill #1 | Status: AC

## 2020-12-23 ENCOUNTER — Other Ambulatory Visit: Payer: Self-pay

## 2020-12-23 ENCOUNTER — Ambulatory Visit (INDEPENDENT_AMBULATORY_CARE_PROVIDER_SITE_OTHER): Payer: No Typology Code available for payment source | Admitting: Family Medicine

## 2020-12-23 ENCOUNTER — Encounter: Payer: Self-pay | Admitting: Family Medicine

## 2020-12-23 ENCOUNTER — Other Ambulatory Visit (HOSPITAL_COMMUNITY): Payer: Self-pay

## 2020-12-23 VITALS — BP 132/78 | HR 81 | Temp 98.3°F | Ht 64.5 in | Wt 185.0 lb

## 2020-12-23 DIAGNOSIS — S50861A Insect bite (nonvenomous) of right forearm, initial encounter: Secondary | ICD-10-CM | POA: Diagnosis not present

## 2020-12-23 DIAGNOSIS — W57XXXA Bitten or stung by nonvenomous insect and other nonvenomous arthropods, initial encounter: Secondary | ICD-10-CM | POA: Diagnosis not present

## 2020-12-23 MED ORDER — TRIAMCINOLONE ACETONIDE 0.1 % EX CREA
1.0000 | TOPICAL_CREAM | Freq: Two times a day (BID) | CUTANEOUS | 0 refills | Status: DC
Start: 2020-12-23 — End: 2021-08-06
  Filled 2020-12-23: qty 30, 15d supply, fill #0

## 2020-12-23 NOTE — Patient Instructions (Addendum)
Ice/cold pack over area for 10-15 min twice daily.  Try not to itch.  Start back on Zyrtec for 7-10 days.  Let us know if you need anything.

## 2020-12-23 NOTE — Progress Notes (Signed)
Chief Complaint  Patient presents with  . Insect Bite    Katie Henry is a 50 y.o. female here for a skin complaint.  Duration: 1 week Location: R forearm Pruritic? Yes Painful? No Drainage? No New soaps/lotions/topicals/detergents? No Other associated symptoms: no fevers Therapies tried thus far: OTC cortisone  Past Medical History:  Diagnosis Date  . Anxiety   . Back pain   . Dental infection 05/28/2011  . Endometriosis   . Fainting episodes    Dr Luther Parody related  . Graves disease   . Grief reaction 11/15/2016  . History of kidney stones 2003   onset with pregnacy   . History of UTI    last 4-5-weeks ago  . Hyperglycemia 11/15/2016   cbg 100  . Hyperthyroidism   . 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 IBS (irritable bowel syndrome)   . Migraine   . Syncope   . Vitamin D deficiency     BP 132/78 (BP Location: Left Arm, Patient Position: Sitting, Cuff Size: Normal)   Pulse 81   Temp 98.3 F (36.8 C) (Oral)   Ht 5' 4.5" (1.638 m)   Wt 185 lb (83.9 kg)   SpO2 98%   BMI 31.26 kg/m  Gen: awake, alert, appearing stated age Lungs: No accessory muscle use Skin: see below. No drainage, TTP, fluctuance, excoriation Psych: Age appropriate judgment and insight   R forearm  Insect bite of right forearm, initial encounter - Plan: triamcinolone cream (KENALOG) 0.1 %  Try not to itch, cold/ice, Zyrtec.  F/u prn. The patient voiced understanding and agreement to the plan.  [place], DO 12/23/20 10:51 AM

## 2021-01-12 ENCOUNTER — Other Ambulatory Visit: Payer: Self-pay | Admitting: Family Medicine

## 2021-01-13 ENCOUNTER — Other Ambulatory Visit (HOSPITAL_COMMUNITY): Payer: Self-pay

## 2021-01-13 MED ORDER — SYNTHROID 175 MCG PO TABS
175.0000 ug | ORAL_TABLET | Freq: Every day | ORAL | 3 refills | Status: DC
  Filled 2021-01-13 – 2021-02-20 (×2): qty 30, 30d supply, fill #0
  Filled 2021-02-20 – 2021-04-08 (×2): qty 30, 30d supply, fill #1
  Filled 2021-05-19: qty 30, 30d supply, fill #2

## 2021-02-03 ENCOUNTER — Ambulatory Visit (INDEPENDENT_AMBULATORY_CARE_PROVIDER_SITE_OTHER): Payer: No Typology Code available for payment source | Admitting: Family Medicine

## 2021-02-03 ENCOUNTER — Other Ambulatory Visit: Payer: Self-pay

## 2021-02-03 ENCOUNTER — Encounter: Payer: Self-pay | Admitting: Family Medicine

## 2021-02-03 VITALS — BP 136/88 | HR 102 | Temp 98.7°F | Resp 16 | Ht 65.0 in

## 2021-02-03 DIAGNOSIS — E785 Hyperlipidemia, unspecified: Secondary | ICD-10-CM

## 2021-02-03 DIAGNOSIS — K635 Polyp of colon: Secondary | ICD-10-CM

## 2021-02-03 DIAGNOSIS — Z0000 Encounter for general adult medical examination without abnormal findings: Secondary | ICD-10-CM | POA: Diagnosis not present

## 2021-02-03 DIAGNOSIS — N200 Calculus of kidney: Secondary | ICD-10-CM | POA: Diagnosis not present

## 2021-02-03 DIAGNOSIS — Z1239 Encounter for other screening for malignant neoplasm of breast: Secondary | ICD-10-CM

## 2021-02-03 DIAGNOSIS — R739 Hyperglycemia, unspecified: Secondary | ICD-10-CM | POA: Diagnosis not present

## 2021-02-03 DIAGNOSIS — Z1159 Encounter for screening for other viral diseases: Secondary | ICD-10-CM | POA: Diagnosis not present

## 2021-02-03 DIAGNOSIS — I10 Essential (primary) hypertension: Secondary | ICD-10-CM | POA: Diagnosis not present

## 2021-02-03 DIAGNOSIS — Z113 Encounter for screening for infections with a predominantly sexual mode of transmission: Secondary | ICD-10-CM

## 2021-02-03 DIAGNOSIS — E559 Vitamin D deficiency, unspecified: Secondary | ICD-10-CM | POA: Diagnosis not present

## 2021-02-03 DIAGNOSIS — E038 Other specified hypothyroidism: Secondary | ICD-10-CM

## 2021-02-03 DIAGNOSIS — Z124 Encounter for screening for malignant neoplasm of cervix: Secondary | ICD-10-CM

## 2021-02-03 DIAGNOSIS — H547 Unspecified visual loss: Secondary | ICD-10-CM

## 2021-02-03 NOTE — Assessment & Plan Note (Addendum)
On Levothyroxine, continue to monitor, TSH is suppressed check further labs and likely decrease dosing of Levothyroxine. Check an ultrasound of thyroid

## 2021-02-03 NOTE — Patient Instructions (Signed)
Shingrix is the new shingles shot, 2 shots over 2-6 months, confirm coverage with insurance and document, then can return here for shots with nurse appt or at [place] is the new COVID medication we can give you if you get COVID so make sure you test if you have symptoms because we have to treat by day 5 of symptoms for it to be effective. If you are positive let us know so we can treat. If a home test is negative and your symptoms are persistent get a PCR test. Can check testing locations at [email or web address] If you are positive we will make an appointment with [place] and we will send in Paxlovid if you would like it. Check with your pharmacy before we meet to confirm they have it in stock, if they do not then we can get the prescription at the [facility]    Preventive Care 88-50 Years Old, Female Preventive care refers to lifestyle choices and visits with your health care provider that can promote health and wellness. This includes: A yearly physical exam. This is also called an annual wellness visit. Regular dental and eye exams. Immunizations. Screening for certain conditions. Healthy lifestyle choices, such as: Eating a healthy diet. Getting regular exercise. Not using drugs or products that contain nicotine and tobacco. Limiting alcohol use. What can I expect for my preventive care visit? Physical exam Your health care provider will check your: Height and weight. These may be used to calculate your BMI (body mass index). BMI is a measurement that tells if you are at a healthy weight. Heart rate and blood pressure. Body temperature. Skin for abnormal spots. Counseling Your health care provider may ask you questions about your: Past medical problems. Family's medical history. Alcohol, tobacco, and drug use. Emotional well-being. Home life and relationship well-being. Sexual activity. Diet, exercise, and sleep habits. Work and work  Statistician. Access to firearms. Method of birth control. Menstrual cycle. Pregnancy history. What immunizations do I need?  Vaccines are usually given at various ages, according to a schedule. Your health care provider will recommend vaccines for you based on your age, medicalhistory, and lifestyle or other factors, such as travel or where you work. What tests do I need? Blood tests Lipid and cholesterol levels. These may be checked every 5 years, or more often if you are over 50 years old. Hepatitis C test. Hepatitis B test. Screening Lung cancer screening. You may have this screening every year starting at age 55 if you have a 30-pack-year history of smoking and currently smoke or have quit within the past 15 years. Colorectal cancer screening. All adults should have this screening starting at age 18 and continuing until age 82. Your health care provider may recommend screening at age 50 if you are at increased risk. You will have tests every 1-10 years, depending on your results and the type of screening test. Diabetes screening. This is done by checking your blood sugar (glucose) after you have not eaten for a while (fasting). You may have this done every 1-3 years. Mammogram. This may be done every 1-2 years. Talk with your health care provider about when you should start having regular mammograms. This may depend on whether you have a family history of breast cancer. BRCA-related cancer screening. This may be done if you have a family history of breast, ovarian, tubal, or peritoneal cancers. Pelvic exam and Pap test. This may be done every 3 years starting at  age 50. Starting at age 67, this may be done every 5 years if you have a Pap test in combination with an HPV test. Other tests STD (sexually transmitted disease) testing, if you are at risk. Bone density scan. This is done to screen for osteoporosis. You may have this scan if you are at high risk for osteoporosis. Talk  with your health care provider about your test results, treatment options,and if necessary, the need for more tests. Follow these instructions at home: Eating and drinking  Eat a diet that includes fresh fruits and vegetables, whole grains, lean protein, and low-fat dairy products. Take vitamin and mineral supplements as recommended by your health care provider. Do not drink alcohol if: Your health care provider tells you not to drink. You are pregnant, may be pregnant, or are planning to become pregnant. If you drink alcohol: Limit how much you have to 0-1 drink a day. Be aware of how much alcohol is in your drink. In the [place] one drink equals one 12 oz bottle of beer (355 mL), one 5 oz glass of wine (148 mL), or one 1 oz glass of hard liquor (44 mL).  Lifestyle Take daily care of your teeth and gums. Brush your teeth every morning and night with fluoride toothpaste. Floss one time each day. Stay active. Exercise for at least 30 minutes 5 or more days each week. Do not use any products that contain nicotine or tobacco, such as cigarettes, e-cigarettes, and chewing tobacco. If you need help quitting, ask your health care provider. Do not use drugs. If you are sexually active, practice safe sex. Use a condom or other form of protection to prevent STIs (sexually transmitted infections). If you do not wish to become pregnant, use a form of birth control. If you plan to become pregnant, see your health care provider for a prepregnancy visit. If told by your health care provider, take low-dose aspirin daily starting at age 10. Find healthy ways to cope with stress, such as: Meditation, yoga, or listening to music. Journaling. Talking to a trusted person. Spending time with friends and family. Safety Always wear your seat belt while driving or riding in a vehicle. Do not drive: If you have been drinking alcohol. Do not ride with someone who has been drinking. When you are tired or  distracted. While texting. Wear a helmet and other protective equipment during sports activities. If you have firearms in your house, make sure you follow all gun safety procedures. What's next? Visit your health care provider once a year for an annual wellness visit. Ask your health care provider how often you should have your eyes and teeth checked. Stay up to date on all vaccines. This information is not intended to replace advice given to you by your health care provider. Make sure you discuss any questions you have with your healthcare provider. Document Revised: 04/08/2020 Document Reviewed: 03/16/2018 Elsevier Patient Education  2022 [employer].

## 2021-02-03 NOTE — Progress Notes (Signed)
Patient ID: Katie Henry, female    DOB: 1971-05-03  Age: 50 y.o. MRN: [id number]    Subjective:  Subjective  HPI Katie Henry presents for office visit today for comprehensive physical exam today and follow up on management of chronic concerns. She states that she has no recent sicknesses or recent ER visits to report. She expresses interest in getting her eyes checked because she reports having to strain her eyes more while reading or looking at computer screens. She also expresses interest in getting a urinary analysis due to her experiencing UTI symptoms. She denies having any GU symptoms like dysuria, back pain, or bleeding.  She went to her cardiologist for experiencing symptoms of PVC, but states that it might be due to the anxiety and life stresses she was dealing with at the time. Denies CP/palp/SOB/HA/congestion/fevers/GI or GU c/o. Taking meds as prescribed.  She was able to go down from 200 lbs to 185 lbs and plans on losing more weight. She endorses walking near home for exercise. She reports that she went on a run a while ago and had elevated blood glucose and was wondering if it was due to the fruits she includes in her smoothies. She endorses taking 5000 IU of vitamin D supplements.   Review of Systems  Constitutional:  Negative for chills, fatigue and fever.  HENT:  Negative for congestion, rhinorrhea, sinus pressure, sinus pain, sore throat and trouble swallowing.   Eyes:  Negative for pain.  Respiratory:  Negative for cough and shortness of breath.   Cardiovascular:  Negative for chest pain, palpitations and leg swelling.  Gastrointestinal:  Negative for abdominal pain, blood in stool, diarrhea, nausea and vomiting.  Genitourinary:  Negative for decreased urine volume, difficulty urinating, dysuria, flank pain, frequency, vaginal bleeding and vaginal discharge.  Musculoskeletal:  Negative for back pain.  Neurological:  Negative for headaches.   History Past Medical  History:  Diagnosis Date   Anxiety    Back pain    Dental infection 05/28/2011   Endometriosis    Fainting episodes    Dr Luther Parody related   Graves disease    Grief reaction 11/15/2016   History of kidney stones 2003   onset with pregnacy    History of UTI    last 4-5-weeks ago   Hyperglycemia 11/15/2016   cbg 100   Hyperthyroidism    Hypothyroidism    hypo after radioactibe iodine   IBS (irritable bowel syndrome)    Migraine    Syncope    Vitamin D deficiency     She has a past surgical history that includes Appendectomy (1991); Endometrial ablation (01/2007); Vaginal hysterectomy (2010); Extracorporeal shock wave lithotripsy (Left, 07/21/2017);  treatment (1998); and Cystoscopy/ureteroscopy/holmium laser/stent placement (Left, 08/09/2017).   Her family history includes Dementia in her father; Diabetes in her maternal grandmother; Berenice Primas' disease in her sister; Heart attack in her maternal aunt; Heart disease in her maternal aunt and mother; Heart disease (age of onset: 25) in her maternal grandfather; Hyperlipidemia in her maternal grandfather, maternal grandmother, and mother; Hypertension in her father and paternal grandfather; Kidney disease in her maternal grandmother; Obesity in her brother; Prostate cancer in her maternal grandfather and another family member; Seizures in her father.She reports that she has quit smoking. Her smoking use included cigarettes. She has a 1.50 pack-year smoking history. She has never used smokeless tobacco. She reports current alcohol use. She reports that she does not use drugs.  Current Outpatient Medications on File Prior to  Visit  Medication Sig Dispense Refill   acetaminophen (TYLENOL) 500 MG tablet Take 1,000 mg by mouth every 6 (six) hours as needed for mild pain or moderate pain.     amLODipine (NORVASC) 5 MG tablet TAKE 1 TABLET (5 MG TOTAL) BY MOUTH DAILY. 90 tablet 3   cholecalciferol (VITAMIN D3) 25 MCG (1000 UT) tablet Take 5,000 Units  by mouth daily.     clotrimazole (LOTRIMIN) 1 % cream Apply 1 application topically 2 (two) times daily. 30 g 0   fluticasone (FLONASE) 50 MCG/ACT nasal spray Place 2 sprays into both nostrils daily. 16 g 6   meclizine (ANTIVERT) 25 MG tablet Take 1 tablet (25 mg total) by mouth 3 (three) times daily as needed for dizziness. 30 tablet 0   Multiple Vitamins-Minerals (MULTIVITAMIN WITH MINERALS) tablet Take 1 tablet by mouth daily.     SYNTHROID 175 MCG tablet Take 1 tablet (175 mcg total) by mouth daily before breakfast. 30 tablet 3   triamcinolone cream (KENALOG) 0.1 % Apply 1 application topically 2 (two) times daily. 30 g 0   valACYclovir (VALTREX) 1000 MG tablet Take 1 tablet (1,000 mg total) by mouth 2 (two) times daily. 60 tablet 0   Vitamin D, Ergocalciferol, (DRISDOL) 1.25 MG (50000 UT) CAPS capsule Take 1 capsule (50,000 Units total) by mouth every 7 (seven) days. 4 capsule 4   ALPRAZolam (XANAX) 0.25 MG tablet Take 1 tablet (0.25 mg total) by mouth 2 (two) times daily as needed for anxiety or sleep. (Patient not taking: No sig reported) 30 tablet 1   cetirizine (ZYRTEC) 10 MG tablet Take 1 tablet (10 mg total) by mouth daily. (Patient not taking: Reported on 02/03/2021) 30 tablet 0   hyoscyamine (LEVSIN SL) 0.125 MG SL tablet Place 1 tablet (0.125 mg total) under the tongue every 4 (four) hours as needed. (Patient not taking: No sig reported) 30 tablet 0   No current facility-administered medications on file prior to visit.     Objective:  Objective  Physical Exam Constitutional:      General: She is not in acute distress.    Appearance: Normal appearance. She is not ill-appearing or toxic-appearing.  HENT:     Head: Normocephalic and atraumatic.     Right Ear: Tympanic membrane, ear canal and external ear normal.     Left Ear: Tympanic membrane, ear canal and external ear normal.     Nose: No congestion or rhinorrhea.  Eyes:     Extraocular Movements: Extraocular movements  intact.     Right eye: No nystagmus.     Left eye: No nystagmus.     Pupils: Pupils are equal, round, and reactive to light.  Cardiovascular:     Rate and Rhythm: Normal rate and regular rhythm.     Pulses: Normal pulses.          Posterior tibial pulses are 2+ on the right side and 2+ on the left side.     Heart sounds: Normal heart sounds. No murmur heard. Pulmonary:     Effort: Pulmonary effort is normal. No respiratory distress.     Breath sounds: Normal breath sounds. No wheezing, rhonchi or rales.  Abdominal:     General: Bowel sounds are normal.     Palpations: Abdomen is soft. There is no mass.     Tenderness: no abdominal tenderness There is no guarding.     Hernia: No hernia is present.  Musculoskeletal:        General: Normal  range of motion.     Cervical back: Normal range of motion and neck supple.  Skin:    General: Skin is warm and dry.  Neurological:     Mental Status: She is alert and oriented to person, place, and time.     Cranial Nerves: No facial asymmetry.     Motor: Motor function is intact. No weakness.  Psychiatric:        Behavior: Behavior normal.   BP 136/88   Pulse (!) 102   Temp 98.7 F (37.1 C)   Resp 16   Ht 5\' 5"  (1.651 m)   SpO2 98%   BMI 30.79 kg/m  Wt Readings from Last 3 Encounters:  12/23/20 185 lb (83.9 kg)  07/17/20 205 lb (93 kg)  12/20/19 192 lb (87.1 kg)     Lab Results  Component Value Date   WBC 8.5 02/03/2021   HGB 13.9 02/03/2021   HCT 41.2 02/03/2021   PLT 431.0 (H) 02/03/2021   GLUCOSE 85 02/03/2021   CHOL 191 02/03/2021   TRIG 170.0 (H) 02/03/2021   HDL 48.80 02/03/2021   LDLDIRECT 167.0 07/24/2019   LDLCALC 108 (H) 02/03/2021   ALT 24 02/03/2021   AST 15 02/03/2021   NA 136 02/03/2021   K 4.3 02/03/2021   CL 104 02/03/2021   CREATININE 0.52 02/03/2021   BUN 9 02/03/2021   CO2 26 02/03/2021   TSH 0.07 (L) 02/03/2021   HGBA1C 4.9 02/03/2021    No results found.   Assessment & Plan:  Plan    No  orders of the defined types were placed in this encounter.   Problem List Items Addressed This Visit     Vitamin D deficiency    Supplement and monitor       Relevant Orders   VITAMIN D 25 Hydroxy (Vit-D Deficiency, Fractures) (Completed)   Hypothyroidism    On Levothyroxine, continue to monitor, TSH is suppressed check further labs and likely decrease dosing of Levothyroxine. Check an ultrasound of thyroid       Relevant Orders   TSH (Completed)   Breast cancer screening   Relevant Orders   MM 3D SCREEN BREAST BILATERAL   Kidney stones    She thought she was developing stones but she is now feeling better. Check UA and culture       Relevant Orders   Urinalysis   Urine Culture   Preventative health care    Patient encouraged to maintain heart healthy diet, regular exercise, adequate sleep. Consider daily probiotics. Take medications as prescribed. Labs ordered and reviewed. Colonoscopy 12/2018 repeat in 2025. MGM ordered and referred to OB/GYN for pap       Hyperglycemia    hgba1c acceptable, minimize simple carbs. Increase exercise as tolerated.        Relevant Orders   Hemoglobin A1c (Completed)   Hyperlipidemia    Encourage heart healthy diet such as MIND or DASH diet, increase exercise, avoid trans fats, simple carbohydrates and processed foods, consider a krill or fish or flaxseed oil cap daily.        Relevant Orders   Lipid panel (Completed)   Essential hypertension - Primary    Well controlled, no changes to meds. Encouraged heart healthy diet such as the DASH diet and exercise as tolerated.        Relevant Orders   Hemoglobin A1c (Completed)   CBC with Differential/Platelet (Completed)   Comprehensive metabolic panel (Completed)   TSH (Completed)   Lipid  panel (Completed)   Colon polyp    Repeat colonoscopy in 2025       Decreased visual acuity    Referred to optometry for evaluation.        Relevant Orders   Ambulatory referral to  Optometry   Other Visit Diagnoses     Need for hepatitis C screening test       Relevant Orders   Hepatitis C antibody (Completed)   Screening for STDs (sexually transmitted diseases)       Relevant Orders   HIV Antibody (routine testing w rflx) (Completed)   Screening examination for STD (sexually transmitted disease)       Encounter for hepatitis C screening test for low risk patient       Relevant Orders   Hepatitis C antibody (Completed)   Cervical cancer screening       Relevant Orders   Ambulatory referral to Obstetrics / Gynecology       Follow-up: Return in about 6 months (around 08/06/2021).  I, Suezanne Jacquet, acting as a scribe for Penni Homans, MD, have documented all relevent documentation on behalf of Penni Homans, MD, as directed by Penni Homans, MD while in the presence of Penni Homans, MD.  I, [name], MD personally performed the services described in this documentation. All medical record entries made by the scribe were at my direction and in my presence. I have reviewed the chart and agree that the record reflects my personal performance and is accurate and complete

## 2021-02-03 NOTE — Assessment & Plan Note (Signed)
Encourage heart healthy diet such as MIND or DASH diet, increase exercise, avoid trans fats, simple carbohydrates and processed foods, consider a krill or fish or flaxseed oil cap daily.  °

## 2021-02-03 NOTE — Assessment & Plan Note (Signed)
hgba1c acceptable, minimize simple carbs. Increase exercise as tolerated.  

## 2021-02-03 NOTE — Assessment & Plan Note (Signed)
Supplement and monitor 

## 2021-02-04 ENCOUNTER — Other Ambulatory Visit: Payer: Self-pay | Admitting: Family Medicine

## 2021-02-04 DIAGNOSIS — E0590 Thyrotoxicosis, unspecified without thyrotoxic crisis or storm: Secondary | ICD-10-CM

## 2021-02-04 DIAGNOSIS — K635 Polyp of colon: Secondary | ICD-10-CM | POA: Insufficient documentation

## 2021-02-04 DIAGNOSIS — H547 Unspecified visual loss: Secondary | ICD-10-CM | POA: Insufficient documentation

## 2021-02-04 LAB — CBC WITH DIFFERENTIAL/PLATELET
Basophils Absolute: 0.1 10*3/uL (ref 0.0–0.1)
Basophils Relative: 1 % (ref 0.0–3.0)
Eosinophils Absolute: 0.1 10*3/uL (ref 0.0–0.7)
Eosinophils Relative: 1.5 % (ref 0.0–5.0)
HCT: 41.2 % (ref 36.0–46.0)
Hemoglobin: 13.9 g/dL (ref 12.0–15.0)
Lymphocytes Relative: 27.7 % (ref 12.0–46.0)
Lymphs Abs: 2.4 10*3/uL (ref 0.7–4.0)
MCHC: 33.6 g/dL (ref 30.0–36.0)
MCV: 92.8 fl (ref 78.0–100.0)
Monocytes Absolute: 0.7 10*3/uL (ref 0.1–1.0)
Monocytes Relative: 8 % (ref 3.0–12.0)
Neutro Abs: 5.3 10*3/uL (ref 1.4–7.7)
Neutrophils Relative %: 61.8 % (ref 43.0–77.0)
Platelets: 431 10*3/uL — ABNORMAL HIGH (ref 150.0–400.0)
RBC: 4.44 Mil/uL (ref 3.87–5.11)
RDW: 12.5 % (ref 11.5–15.5)
WBC: 8.5 10*3/uL (ref 4.0–10.5)

## 2021-02-04 LAB — LIPID PANEL
Cholesterol: 191 mg/dL (ref 0–200)
HDL: 48.8 mg/dL (ref >39.00)
LDL Cholesterol: 108 mg/dL — ABNORMAL HIGH (ref 0–99)
NonHDL: 142.19
Total CHOL/HDL Ratio: 4
Triglycerides: 170 mg/dL — ABNORMAL HIGH (ref 0.0–149.0)
VLDL: 34 mg/dL (ref 0.0–40.0)

## 2021-02-04 LAB — COMPREHENSIVE METABOLIC PANEL
ALT: 24 U/L (ref 0–35)
AST: 15 U/L (ref 0–37)
Albumin: 4 g/dL (ref 3.5–5.2)
Alkaline Phosphatase: 71 U/L (ref 39–117)
BUN: 9 mg/dL (ref 6–23)
CO2: 26 mEq/L (ref 19–32)
Calcium: 9.6 mg/dL (ref 8.4–10.5)
Chloride: 104 mEq/L (ref 96–112)
Creatinine, Ser: 0.52 mg/dL (ref 0.40–1.20)
GFR: 108.59 mL/min (ref >60.00)
Glucose, Bld: 85 mg/dL (ref 70–99)
Potassium: 4.3 mEq/L (ref 3.5–5.1)
Sodium: 136 mEq/L (ref 135–145)
Total Bilirubin: 0.4 mg/dL (ref 0.2–1.2)
Total Protein: 6.5 g/dL (ref 6.0–8.3)

## 2021-02-04 LAB — URINALYSIS, ROUTINE W REFLEX MICROSCOPIC
Bilirubin Urine: NEGATIVE
Ketones, ur: NEGATIVE
Leukocytes,Ua: NEGATIVE
Nitrite: POSITIVE — AB
Specific Gravity, Urine: 1.025 (ref 1.000–1.030)
Total Protein, Urine: NEGATIVE
Urine Glucose: NEGATIVE
Urobilinogen, UA: 0.2 (ref 0.0–1.0)
pH: 5.5 (ref 5.0–8.0)

## 2021-02-04 LAB — HEMOGLOBIN A1C: Hgb A1c MFr Bld: 4.9 % (ref 4.6–6.5)

## 2021-02-04 LAB — VITAMIN D 25 HYDROXY (VIT D DEFICIENCY, FRACTURES): VITD: 27.33 ng/mL — ABNORMAL LOW (ref 30.00–100.00)

## 2021-02-04 LAB — TSH: TSH: 0.07 u[IU]/mL — ABNORMAL LOW (ref 0.35–5.50)

## 2021-02-04 NOTE — Assessment & Plan Note (Signed)
Referred to optometry for evaluation.

## 2021-02-04 NOTE — Assessment & Plan Note (Signed)
She thought she was developing stones but she is now feeling better. Check UA and culture

## 2021-02-04 NOTE — Assessment & Plan Note (Addendum)
Patient encouraged to maintain heart healthy diet, regular exercise, adequate sleep. Consider daily probiotics. Take medications as prescribed. Labs ordered and reviewed. Colonoscopy 12/2018 repeat in 2025. MGM ordered and referred to OB/GYN for pap

## 2021-02-04 NOTE — Assessment & Plan Note (Signed)
Well controlled, no changes to meds. Encouraged heart healthy diet such as the DASH diet and exercise as tolerated.  °

## 2021-02-04 NOTE — Assessment & Plan Note (Signed)
Repeat colonoscopy in 2025

## 2021-02-05 ENCOUNTER — Other Ambulatory Visit: Payer: Self-pay | Admitting: Family Medicine

## 2021-02-05 ENCOUNTER — Other Ambulatory Visit (INDEPENDENT_AMBULATORY_CARE_PROVIDER_SITE_OTHER): Payer: No Typology Code available for payment source

## 2021-02-05 DIAGNOSIS — E039 Hypothyroidism, unspecified: Secondary | ICD-10-CM

## 2021-02-05 LAB — URINE CULTURE
MICRO NUMBER:: 12136399
SPECIMEN QUALITY:: ADEQUATE

## 2021-02-05 LAB — T4, FREE: Free T4: 1.41 ng/dL (ref 0.60–1.60)

## 2021-02-05 LAB — T3, FREE: T3, Free: 3.5 pg/mL (ref 2.3–4.2)

## 2021-02-05 MED ORDER — CIPROFLOXACIN HCL 250 MG PO TABS
250.0000 mg | ORAL_TABLET | Freq: Two times a day (BID) | ORAL | 0 refills | Status: DC
  Filled 2021-02-05 – 2021-02-06 (×2): qty 6, 3d supply, fill #0

## 2021-02-05 NOTE — Addendum Note (Signed)
Addended by: Octavio Manns E on: 02/05/2021 11:42 AM   Modules accepted: Orders

## 2021-02-06 ENCOUNTER — Other Ambulatory Visit (HOSPITAL_COMMUNITY): Payer: Self-pay

## 2021-02-06 ENCOUNTER — Encounter: Payer: Self-pay | Admitting: *Deleted

## 2021-02-06 LAB — THYROID PEROXIDASE ANTIBODY: Thyroperoxidase Ab SerPl-aCnc: 1 IU/mL (ref <9)

## 2021-02-06 LAB — HIV ANTIBODY (ROUTINE TESTING W REFLEX): HIV 1&2 Ab, 4th Generation: NONREACTIVE

## 2021-02-06 LAB — TEST AUTHORIZATION

## 2021-02-06 LAB — HEPATITIS C ANTIBODY
Hepatitis C Ab: NONREACTIVE
SIGNAL TO CUT-OFF: 0.01 (ref <1.00)

## 2021-02-08 ENCOUNTER — Other Ambulatory Visit: Payer: Self-pay | Admitting: Family Medicine

## 2021-02-08 MED ORDER — CIPROFLOXACIN HCL 250 MG PO TABS: 250.0000 mg | ORAL_TABLET | Freq: Two times a day (BID) | ORAL | 0 refills | Status: AC

## 2021-02-09 ENCOUNTER — Other Ambulatory Visit (HOSPITAL_COMMUNITY): Payer: Self-pay

## 2021-02-10 ENCOUNTER — Encounter: Payer: No Typology Code available for payment source | Admitting: Family Medicine

## 2021-02-10 ENCOUNTER — Other Ambulatory Visit: Payer: Self-pay | Admitting: *Deleted

## 2021-02-10 DIAGNOSIS — E0590 Thyrotoxicosis, unspecified without thyrotoxic crisis or storm: Secondary | ICD-10-CM

## 2021-02-10 NOTE — Telephone Encounter (Signed)
Future orders have been placed.

## 2021-02-17 ENCOUNTER — Ambulatory Visit
Admission: RE | Admit: 2021-02-17 | Discharge: 2021-02-17 | Disposition: A | Discharge status: Home / Self Care | Payer: No Typology Code available for payment source | Source: Ambulatory Visit | Attending: Family Medicine | Admitting: Family Medicine

## 2021-02-17 DIAGNOSIS — E0590 Thyrotoxicosis, unspecified without thyrotoxic crisis or storm: Secondary | ICD-10-CM

## 2021-02-20 ENCOUNTER — Other Ambulatory Visit (HOSPITAL_COMMUNITY): Payer: Self-pay

## 2021-03-18 ENCOUNTER — Encounter: Payer: No Typology Code available for payment source | Admitting: Adult Health

## 2021-04-08 ENCOUNTER — Other Ambulatory Visit (HOSPITAL_COMMUNITY): Payer: Self-pay

## 2021-04-09 ENCOUNTER — Other Ambulatory Visit (HOSPITAL_COMMUNITY): Payer: Self-pay

## 2021-05-19 ENCOUNTER — Other Ambulatory Visit (HOSPITAL_COMMUNITY): Payer: Self-pay

## 2021-07-03 ENCOUNTER — Other Ambulatory Visit: Payer: Self-pay | Admitting: Family Medicine

## 2021-07-03 ENCOUNTER — Other Ambulatory Visit (HOSPITAL_COMMUNITY): Payer: Self-pay

## 2021-07-03 MED ORDER — SYNTHROID 175 MCG PO TABS
175.0000 ug | ORAL_TABLET | Freq: Every day | ORAL | 3 refills | Status: DC
  Filled 2021-07-03 – 2021-08-12 (×2): qty 30, 30d supply, fill #0
  Filled 2021-10-02: qty 30, 30d supply, fill #1
  Filled 2021-10-26 (×2): qty 30, 30d supply, fill #2

## 2021-07-09 ENCOUNTER — Other Ambulatory Visit (HOSPITAL_COMMUNITY): Payer: Self-pay

## 2021-07-13 ENCOUNTER — Encounter: Payer: Self-pay | Admitting: Family Medicine

## 2021-07-14 ENCOUNTER — Encounter: Payer: Self-pay | Admitting: Nurse Practitioner

## 2021-07-14 ENCOUNTER — Telehealth: Payer: No Typology Code available for payment source | Admitting: Nurse Practitioner

## 2021-07-14 DIAGNOSIS — U071 COVID-19: Secondary | ICD-10-CM

## 2021-07-14 MED ORDER — NIRMATRELVIR/RITONAVIR (PAXLOVID)TABLET: 3.0000 | ORAL_TABLET | Freq: Two times a day (BID) | ORAL | 0 refills | Status: AC

## 2021-07-14 MED ORDER — NIRMATRELVIR/RITONAVIR (PAXLOVID)TABLET: 3.0000 | ORAL_TABLET | Freq: Two times a day (BID) | ORAL | 0 refills | Status: DC

## 2021-07-14 NOTE — Addendum Note (Signed)
Addended by: Apolonio Schneiders E on: 07/14/2021 01:39 PM   Modules accepted: Orders

## 2021-07-14 NOTE — Telephone Encounter (Signed)
Pt seen today

## 2021-07-14 NOTE — Addendum Note (Signed)
Addended by: Apolonio Schneiders E on: 07/14/2021 01:40 PM   Modules accepted: Orders

## 2021-07-14 NOTE — Progress Notes (Signed)
Virtual Visit Consent   Katie Henry, you are scheduled for a virtual visit with a [facility] provider today.     Just as with appointments in the office, your consent must be obtained to participate.  Your consent will be active for this visit and any virtual visit you may have with one of our providers in the next 365 days.     If you have a MyChart account, a copy of this consent can be sent to you electronically.  All virtual visits are billed to your insurance company just like a traditional visit in the office.    As this is a virtual visit, video technology does not allow for your provider to perform a traditional examination.  This may limit your provider's ability to fully assess your condition.  If your provider identifies any concerns that need to be evaluated in person or the need to arrange testing (such as labs, EKG, etc.), we will make arrangements to do so.     Although advances in technology are sophisticated, we cannot ensure that it will always work on either your end or our end.  If the connection with a video visit is poor, the visit may have to be switched to a telephone visit.  With either a video or telephone visit, we are not always able to ensure that we have a secure connection.     I need to obtain your verbal consent now.   Are you willing to proceed with your visit today?    Katie Henry has provided verbal consent on 07/14/2021 for a virtual visit (video or telephone).   Apolonio Schneiders, FNP   Date: 07/14/2021 8:48 AM   Virtual Visit via Video Note   I, Apolonio Schneiders, connected with  Katie Henry  ([id number], April 25, 1971) on 07/14/21 at  9:15 AM EST by a video-enabled telemedicine application and verified that I am speaking with the correct person using two identifiers.  Location: Patient: Virtual Visit Location Patient: Home Provider: Virtual Visit Location Provider: Home Office   I discussed the limitations of evaluation and management by telemedicine  and the availability of in person appointments. The patient expressed understanding and agreed to proceed.    History of Present Illness: Katie Henry is a 50 y.o. who identifies as a female who was assigned female at birth, and is being seen today after testing positive for COVID.   She started to have symptoms 2 days ago with headache and fever.  She tested positive yesterday.   She has had COVID in the past most recently April of this year. She denies any complications.   She has been vaccinated x3 for COVID without recent booster.   She denies a history of asthma   She has been taking tylenol for pain  Currently not using Norvasc for the past 2 months  Most recent Zyrtec was in September  No currently using Flonase  Valtrex as needed for cold sores   GFR 108 July of this year   Denies a history of kidney disease  Problems:  Patient Active Problem List   Diagnosis Date Noted   Colon polyp 02/04/2021   Decreased visual acuity 02/04/2021   Acute pain of right knee 09/26/2019   Low back pain 05/20/2019   Essential hypertension 05/15/2019   Class 1 obesity with serious comorbidity and body mass index (BMI) of 33.0 to 33.9 in adult 05/15/2019   Tinea corporis 11/27/2018   Rectal bleeding 11/27/2018  Hyperlipidemia 12/13/2017   Plantar fasciitis 01/11/2017   Tailor's bunion 01/11/2017   Grief reaction 11/15/2016   Hyperglycemia 11/15/2016   Preventative health care 10/14/2013   Graves disease    Endometriosis    IBS (irritable bowel syndrome)    Syncope    Heel pain 07/17/2012   Migraines 01/13/2012   Kidney stones 01/13/2012   Breast cancer screening 07/08/2011   Vitamin D deficiency 05/23/2011   Hypothyroidism 05/23/2011   Anxiety and depression 05/23/2011    Allergies:  Allergies  Allergen Reactions   Zofran [Ondansetron Hcl] Other (See Comments)    Pt has hx of migraines; medication increases headaches and nausea    Amoxicillin Nausea And Vomiting and  Other (See Comments)    Has patient had a PCN reaction causing immediate rash, facial/tongue/throat swelling, SOB or lightheadedness with hypotension: Unknown Has patient had a PCN reaction causing severe rash involving mucus membranes or skin necrosis: No Has patient had a PCN reaction that required hospitalization: No Has patient had a PCN reaction occurring within the last 10 years: No If all of the above answers are "NO", then may proceed with Cephalosporin use.    Codeine Nausea And Vomiting   Imitrex [Sumatriptan] Nausea And Vomiting   Ultram [Tramadol Hcl] Nausea And Vomiting   Medications:  Current Outpatient Medications:    acetaminophen (TYLENOL) 500 MG tablet, Take 1,000 mg by mouth every 6 (six) hours as needed for mild pain or moderate pain., Disp: , Rfl:    ALPRAZolam (XANAX) 0.25 MG tablet, Take 1 tablet (0.25 mg total) by mouth 2 (two) times daily as needed for anxiety or sleep. (Patient not taking: No sig reported), Disp: 30 tablet, Rfl: 1   amLODipine (NORVASC) 5 MG tablet, TAKE 1 TABLET (5 MG TOTAL) BY MOUTH DAILY., Disp: 90 tablet, Rfl: 3   cetirizine (ZYRTEC) 10 MG tablet, Take 1 tablet (10 mg total) by mouth daily. (Patient not taking: Reported on 02/03/2021), Disp: 30 tablet, Rfl: 0   cholecalciferol (VITAMIN D3) 25 MCG (1000 UT) tablet, Take 5,000 Units by mouth daily., Disp: , Rfl:    clotrimazole (LOTRIMIN) 1 % cream, Apply 1 application topically 2 (two) times daily., Disp: 30 g, Rfl: 0   fluticasone (FLONASE) 50 MCG/ACT nasal spray, Place 2 sprays into both nostrils daily., Disp: 16 g, Rfl: 6   hyoscyamine (LEVSIN SL) 0.125 MG SL tablet, Place 1 tablet (0.125 mg total) under the tongue every 4 (four) hours as needed. (Patient not taking: No sig reported), Disp: 30 tablet, Rfl: 0   meclizine (ANTIVERT) 25 MG tablet, Take 1 tablet (25 mg total) by mouth 3 (three) times daily as needed for dizziness., Disp: 30 tablet, Rfl: 0   Multiple Vitamins-Minerals (MULTIVITAMIN  WITH MINERALS) tablet, Take 1 tablet by mouth daily., Disp: , Rfl:    SYNTHROID 175 MCG tablet, Take 1 tablet (175 mcg total) by mouth daily before breakfast., Disp: 30 tablet, Rfl: 3   triamcinolone cream (KENALOG) 0.1 %, Apply 1 application topically 2 (two) times daily., Disp: 30 g, Rfl: 0   valACYclovir (VALTREX) 1000 MG tablet, Take 1 tablet (1,000 mg total) by mouth 2 (two) times daily., Disp: 60 tablet, Rfl: 0   Vitamin D, Ergocalciferol, (DRISDOL) 1.25 MG (50000 UT) CAPS capsule, Take 1 capsule (50,000 Units total) by mouth every 7 (seven) days., Disp: 4 capsule, Rfl: 4  Observations/Objective: Patient is well-developed, well-nourished in no acute distress.  Resting comfortably at home.  Head is normocephalic, atraumatic.  No labored  breathing.  Speech is clear and coherent with logical content.  Patient is alert and oriented at baseline.    Assessment and Plan: 1. COVID-19 Take anti-viral with food  - nirmatrelvir/ritonavir EUA (PAXLOVID) 20 x 150 MG & 10 x 100MG  TABS; Take 3 tablets by mouth 2 (two) times daily for 5 days. (Take nirmatrelvir 150 mg two tablets twice daily for 5 days and ritonavir 100 mg one tablet twice daily for 5 days) Patient GFR is 108  Dispense: 30 tablet; Refill: 0    Discussed drug to drug interactions, would need to stop anti-viral if restarting Xanax, Flonase, or Norvasc.   Continue to manage symptoms with over the counter cold/flu medications as discussed Push fluids Rest  Assure hydration  Follow Up Instructions: I discussed the assessment and treatment plan with the patient. The patient was provided an opportunity to ask questions and all were answered. The patient agreed with the plan and demonstrated an understanding of the instructions.  A copy of instructions were sent to the patient via MyChart unless otherwise noted below.     The patient was advised to call back or seek an in-person evaluation if the symptoms worsen or if the condition  fails to improve as anticipated.  Time:  I spent 10 minutes with the patient via telehealth technology discussing the above problems/concerns.    Apolonio Schneiders, FNP

## 2021-07-17 ENCOUNTER — Other Ambulatory Visit (HOSPITAL_COMMUNITY): Payer: Self-pay

## 2021-08-06 ENCOUNTER — Encounter: Payer: Self-pay | Admitting: Family Medicine

## 2021-08-06 ENCOUNTER — Ambulatory Visit (INDEPENDENT_AMBULATORY_CARE_PROVIDER_SITE_OTHER): Payer: No Typology Code available for payment source | Admitting: Family Medicine

## 2021-08-06 VITALS — BP 136/90 | HR 71 | Temp 98.1°F | Resp 16 | Ht 65.0 in | Wt 201.2 lb

## 2021-08-06 DIAGNOSIS — G43909 Migraine, unspecified, not intractable, without status migrainosus: Secondary | ICD-10-CM | POA: Diagnosis not present

## 2021-08-06 DIAGNOSIS — E785 Hyperlipidemia, unspecified: Secondary | ICD-10-CM

## 2021-08-06 DIAGNOSIS — I10 Essential (primary) hypertension: Secondary | ICD-10-CM

## 2021-08-06 DIAGNOSIS — F32A Depression, unspecified: Secondary | ICD-10-CM

## 2021-08-06 DIAGNOSIS — Z6833 Body mass index (BMI) 33.0-33.9, adult: Secondary | ICD-10-CM

## 2021-08-06 DIAGNOSIS — E038 Other specified hypothyroidism: Secondary | ICD-10-CM | POA: Diagnosis not present

## 2021-08-06 DIAGNOSIS — Z1231 Encounter for screening mammogram for malignant neoplasm of breast: Secondary | ICD-10-CM

## 2021-08-06 DIAGNOSIS — E559 Vitamin D deficiency, unspecified: Secondary | ICD-10-CM

## 2021-08-06 DIAGNOSIS — E669 Obesity, unspecified: Secondary | ICD-10-CM

## 2021-08-06 DIAGNOSIS — Z23 Encounter for immunization: Secondary | ICD-10-CM

## 2021-08-06 DIAGNOSIS — R739 Hyperglycemia, unspecified: Secondary | ICD-10-CM | POA: Diagnosis not present

## 2021-08-06 DIAGNOSIS — F419 Anxiety disorder, unspecified: Secondary | ICD-10-CM

## 2021-08-06 LAB — COMPREHENSIVE METABOLIC PANEL
ALT: 22 U/L (ref 0–35)
AST: 15 U/L (ref 0–37)
Albumin: 4.4 g/dL (ref 3.5–5.2)
Alkaline Phosphatase: 67 U/L (ref 39–117)
BUN: 9 mg/dL (ref 6–23)
CO2: 26 mEq/L (ref 19–32)
Calcium: 9.6 mg/dL (ref 8.4–10.5)
Chloride: 102 mEq/L (ref 96–112)
Creatinine, Ser: 0.6 mg/dL (ref 0.40–1.20)
GFR: 104.54 mL/min (ref >60.00)
Glucose, Bld: 84 mg/dL (ref 70–99)
Potassium: 4.5 mEq/L (ref 3.5–5.1)
Sodium: 139 mEq/L (ref 135–145)
Total Bilirubin: 0.6 mg/dL (ref 0.2–1.2)
Total Protein: 7.3 g/dL (ref 6.0–8.3)

## 2021-08-06 LAB — TSH: TSH: 0.45 u[IU]/mL (ref 0.35–5.50)

## 2021-08-06 LAB — CBC
HCT: 43.6 % (ref 36.0–46.0)
Hemoglobin: 14.3 g/dL (ref 12.0–15.0)
MCHC: 32.7 g/dL (ref 30.0–36.0)
MCV: 95.5 fl (ref 78.0–100.0)
Platelets: 433 10*3/uL — ABNORMAL HIGH (ref 150.0–400.0)
RBC: 4.57 Mil/uL (ref 3.87–5.11)
RDW: 13.2 % (ref 11.5–15.5)
WBC: 7.3 10*3/uL (ref 4.0–10.5)

## 2021-08-06 LAB — LIPID PANEL
Cholesterol: 263 mg/dL — ABNORMAL HIGH (ref 0–200)
HDL: 61.4 mg/dL (ref >39.00)
LDL Cholesterol: 165 mg/dL — ABNORMAL HIGH (ref 0–99)
NonHDL: 201.27
Total CHOL/HDL Ratio: 4
Triglycerides: 179 mg/dL — ABNORMAL HIGH (ref 0.0–149.0)
VLDL: 35.8 mg/dL (ref 0.0–40.0)

## 2021-08-06 LAB — HEMOGLOBIN A1C: Hgb A1c MFr Bld: 5.1 % (ref 4.6–6.5)

## 2021-08-06 LAB — VITAMIN D 25 HYDROXY (VIT D DEFICIENCY, FRACTURES): VITD: 27.31 ng/mL — ABNORMAL LOW (ref 30.00–100.00)

## 2021-08-06 LAB — T4, FREE: Free T4: 1.11 ng/dL (ref 0.60–1.60)

## 2021-08-06 NOTE — Assessment & Plan Note (Signed)
hgba1c acceptable, minimize simple carbs. Increase exercise as tolerated.  

## 2021-08-06 NOTE — Patient Instructions (Signed)
Monitor bp and let us know if any high numbers   Hypertension, Adult High blood pressure (hypertension) is when the force of blood pumping through the arteries is too strong. The arteries are the blood vessels that carry blood from the heart throughout the body. Hypertension forces the heart to work harder to pump blood and may cause arteries to become narrow or stiff. Untreated or uncontrolled hypertension can cause a heart attack, heart failure, a stroke, kidney disease, and other problems. A blood pressure reading consists of a higher number over a lower number. Ideally, your blood pressure should be below 120/80. The first ("top") number is called the systolic pressure. It is a measure of the pressure in your arteries as your heart beats. The second ("bottom") number is called the diastolic pressure. It is a measure of the pressure in your arteries as the heart relaxes. What are the causes? The exact cause of this condition is not known. There are some conditions that result in or are related to high blood pressure. What increases the risk? Some risk factors for high blood pressure are under your control. The following factors may make you more likely to develop this condition: Smoking. Having type 2 diabetes mellitus, high cholesterol, or both. Not getting enough exercise or physical activity. Being overweight. Having too much fat, sugar, calories, or salt (sodium) in your diet. Drinking too much alcohol. Some risk factors for high blood pressure may be difficult or impossible to change. Some of these factors include: Having chronic kidney disease. Having a family history of high blood pressure. Age. Risk increases with age. Race. You may be at higher risk if you are African American. Gender. Men are at higher risk than women before age 76. After age 78, women are at higher risk than men. Having obstructive sleep apnea. Stress. What are the signs or symptoms? High blood pressure may not  cause symptoms. Very high blood pressure (hypertensive crisis) may cause: Headache. Anxiety. Shortness of breath. Nosebleed. Nausea and vomiting. Vision changes. Severe chest pain. Seizures. How is this diagnosed? This condition is diagnosed by measuring your blood pressure while you are seated, with your arm resting on a flat surface, your legs uncrossed, and your feet flat on the floor. The cuff of the blood pressure monitor will be placed directly against the skin of your upper arm at the level of your heart. It should be measured at least twice using the same arm. Certain conditions can cause a difference in blood pressure between your right and left arms. Certain factors can cause blood pressure readings to be lower or higher than normal for a short period of time: When your blood pressure is higher when you are in a health care provider's office than when you are at home, this is called white coat hypertension. Most people with this condition do not need medicines. When your blood pressure is higher at home than when you are in a health care provider's office, this is called masked hypertension. Most people with this condition may need medicines to control blood pressure. If you have a high blood pressure reading during one visit or you have normal blood pressure with other risk factors, you may be asked to: Return on a different day to have your blood pressure checked again. Monitor your blood pressure at home for 1 week or longer. If you are diagnosed with hypertension, you may have other blood or imaging tests to help your health care provider understand your overall risk for  other conditions. How is this treated? This condition is treated by making healthy lifestyle changes, such as eating healthy foods, exercising more, and reducing your alcohol intake. Your health care provider may prescribe medicine if lifestyle changes are not enough to get your blood pressure under control, and  if: Your systolic blood pressure is above 130. Your diastolic blood pressure is above 80. Your personal target blood pressure may vary depending on your medical conditions, your age, and other factors. Follow these instructions at home: Eating and drinking  Eat a diet that is high in fiber and potassium, and low in sodium, added sugar, and fat. An example eating plan is called the DASH (Dietary Approaches to Stop Hypertension) diet. To eat this way: Eat plenty of fresh fruits and vegetables. Try to fill one half of your plate at each meal with fruits and vegetables. Eat whole grains, such as whole-wheat pasta, brown rice, or whole-grain bread. Fill about one fourth of your plate with whole grains. Eat or drink low-fat dairy products, such as skim milk or low-fat yogurt. Avoid fatty cuts of meat, processed or cured meats, and poultry with skin. Fill about one fourth of your plate with lean proteins, such as fish, chicken without skin, beans, eggs, or tofu. Avoid pre-made and processed foods. These tend to be higher in sodium, added sugar, and fat. Reduce your daily sodium intake. Most people with hypertension should eat less than 1,500 mg of sodium a day. Do not drink alcohol if: Your health care provider tells you not to drink. You are pregnant, may be pregnant, or are planning to become pregnant. If you drink alcohol: Limit how much you use to: 0-1 drink a day for women. 0-2 drinks a day for men. Be aware of how much alcohol is in your drink. In the [place] one drink equals one 12 oz bottle of beer (355 mL), one 5 oz glass of wine (148 mL), or one 1 oz glass of hard liquor (44 mL). Lifestyle  Work with your health care provider to maintain a healthy body weight or to lose weight. Ask what an ideal weight is for you. Get at least 30 minutes of exercise most days of the week. Activities may include walking, swimming, or biking. Include exercise to strengthen your muscles (resistance  exercise), such as Pilates or lifting weights, as part of your weekly exercise routine. Try to do these types of exercises for 30 minutes at least 3 days a week. Do not use any products that contain nicotine or tobacco, such as cigarettes, e-cigarettes, and chewing tobacco. If you need help quitting, ask your health care provider. Monitor your blood pressure at home as told by your health care provider. Keep all follow-up visits as told by your health care provider. This is important. Medicines Take over-the-counter and prescription medicines only as told by your health care provider. Follow directions carefully. Blood pressure medicines must be taken as prescribed. Do not skip doses of blood pressure medicine. Doing this puts you at risk for problems and can make the medicine less effective. Ask your health care provider about side effects or reactions to medicines that you should watch for. Contact a health care provider if you: Think you are having a reaction to a medicine you are taking. Have headaches that keep coming back (recurring). Feel dizzy. Have swelling in your ankles. Have trouble with your vision. Get help right away if you: Develop a severe headache or confusion. Have unusual weakness or numbness. Feel faint.  Have severe pain in your chest or abdomen. Vomit repeatedly. Have trouble breathing. Summary Hypertension is when the force of blood pumping through your arteries is too strong. If this condition is not controlled, it may put you at risk for serious complications. Your personal target blood pressure may vary depending on your medical conditions, your age, and other factors. For most people, a normal blood pressure is less than 120/80. Hypertension is treated with lifestyle changes, medicines, or a combination of both. Lifestyle changes include losing weight, eating a healthy, low-sodium diet, exercising more, and limiting alcohol. This information is not intended to  replace advice given to you by your health care provider. Make sure you discuss any questions you have with your health care provider. Document Revised: 03/15/2018 Document Reviewed: 03/15/2018 Elsevier Patient Education  [place].

## 2021-08-06 NOTE — Assessment & Plan Note (Signed)
Supplement and monitor 

## 2021-08-06 NOTE — Assessment & Plan Note (Signed)
On Levothyroxine, continue to monitor 

## 2021-08-06 NOTE — Assessment & Plan Note (Signed)
Flared with stress of her husband getting diagnosis of recurrence of his cancer but she is hopeful not to take on another med. Encouraged increased hydration, 64 ounces of clear fluids daily. Minimize alcohol and caffeine. Eat small frequent meals with lean proteins and complex carbs. Avoid high and low blood sugars. Get adequate sleep, 7-8 hours a night. Needs exercise daily preferably in the morning.

## 2021-08-06 NOTE — Assessment & Plan Note (Signed)
Encourage heart healthy diet such as MIND or DASH diet, increase exercise, avoid trans fats, simple carbohydrates and processed foods, consider a krill or fish or flaxseed oil cap daily.  °

## 2021-08-06 NOTE — Assessment & Plan Note (Addendum)
Well controlled, no changes to meds. Encouraged heart healthy diet such as the DASH diet and exercise as tolerated. She notes her blood pressures have all been good at home.

## 2021-08-07 ENCOUNTER — Other Ambulatory Visit: Payer: Self-pay

## 2021-08-07 ENCOUNTER — Other Ambulatory Visit (HOSPITAL_COMMUNITY): Payer: Self-pay

## 2021-08-07 DIAGNOSIS — E785 Hyperlipidemia, unspecified: Secondary | ICD-10-CM

## 2021-08-07 DIAGNOSIS — E559 Vitamin D deficiency, unspecified: Secondary | ICD-10-CM

## 2021-08-07 DIAGNOSIS — E038 Other specified hypothyroidism: Secondary | ICD-10-CM

## 2021-08-07 DIAGNOSIS — I10 Essential (primary) hypertension: Secondary | ICD-10-CM

## 2021-08-07 MED ORDER — ATORVASTATIN CALCIUM 10 MG PO TABS
10.0000 mg | ORAL_TABLET | Freq: Every day | ORAL | 3 refills | Status: DC
  Filled 2021-08-07 – 2021-08-13 (×2): qty 30, 30d supply, fill #0

## 2021-08-07 NOTE — Progress Notes (Signed)
Subjective:    Patient ID: Katie Henry, female    DOB: 09-07-1970, 51 y.o.   MRN: [id number]  Chief Complaint  Patient presents with   6 months follow up    HPI Patient is in today for follow up on chronic medical concerns. No recent febrile illness or hospitalizations. Her husband just hit the five year mark since his cancer diagnosis but unfortunately has now had a recurrence of his cancer. She is sad and stressed but feels she is managing adequately. Denies CP/palp/SOB/HA/congestion/fevers/GI or GU c/o. Taking meds as prescribed   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   IBS (irritable bowel syndrome)    Migraine    Syncope    Vitamin D deficiency     Past Surgical History:  Procedure Laterality Date    treatment  [place]   CYSTOSCOPY/URETEROSCOPY/HOLMIUM LASER/STENT PLACEMENT Left 08/09/2017   Procedure: CYSTOSCOPY/RETROGRADE/URETEROSCOPY/HOLMIUM LASER/STENT PLACEMENT;  Surgeon: Festus Aloe, MD;  Location: [facility];  Service: Urology;  Laterality: Left;  ONLY NEEDS 60 MIN   ENDOMETRIAL ABLATION  01/2007   EXTRACORPOREAL SHOCK WAVE LITHOTRIPSY Left 07/21/2017   Procedure: LEFT EXTRACORPOREAL SHOCK WAVE LITHOTRIPSY (ESWL);  Surgeon: Bjorn Loser, MD;  Location: [facility];  Service: Urology;  Laterality: Left;   VAGINAL HYSTERECTOMY  2010   ovaries still in place    Family History  Problem Relation Age of Onset   Hyperlipidemia Mother    Heart disease Mother        bicuspid mitral valve   Hyperlipidemia Maternal Grandmother    Diabetes Maternal Grandmother    Kidney disease  Maternal Grandmother    Hyperlipidemia Maternal Grandfather    Heart disease Maternal Grandfather 56       MI   Prostate cancer Maternal Grandfather    Hypertension Father    Dementia Father    Seizures Father        s/p TBI   Hypertension Paternal Grandfather    Graves' disease Sister    Obesity Brother    Prostate cancer Other        maternal and paternal grandparents   Heart attack Maternal Aunt        deceased age 51   Heart disease Maternal Aunt    Colon cancer Neg Hx    Esophageal cancer Neg Hx     Social History   Socioeconomic History   Marital status: Married    Spouse name: Rasheema Truluck   Number of children: 2   Years of education: 13   Highest education level: Not on file  Occupational History    Employer: [employer]  Tobacco Use   Smoking status: Former    Packs/day: 0.25    Years: 6.00    Pack years: 1.50    Types: Cigarettes   Smokeless tobacco: Never   Tobacco comments:    quit 2010  Vaping Use   Vaping Use: Never used  Substance and Sexual Activity   Alcohol use: Yes  Comment: Ocassionally   Drug use: No   Sexual activity: Yes    Partners: Male    Comment: work at [employer], lives with fiance and son  Other Topics Concern   Not on file  Social History Narrative   Engaged, two sons.   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   No Tob, occ alcohol, no drugs.   Social Determinants of Health   Financial Resource Strain: Not on file  Food Insecurity: Not on file  Transportation Needs: Not on file  Physical Activity: Not on file  Stress: Not on file  Social Connections: Not on file  Intimate Partner Violence: Not on file    Outpatient Medications Prior to Visit  Medication Sig Dispense Refill   acetaminophen (TYLENOL) 500 MG tablet Take 1,000 mg by mouth every 6 (six) hours as needed for mild pain or moderate pain.     cholecalciferol (VITAMIN D3) 25 MCG (1000 UT) tablet Take 5,000 Units by mouth  daily.     fluticasone (FLONASE) 50 MCG/ACT nasal spray Place 2 sprays into both nostrils daily. 16 g 6   Multiple Vitamins-Minerals (MULTIVITAMIN WITH MINERALS) tablet Take 1 tablet by mouth daily.     SYNTHROID 175 MCG tablet Take 1 tablet (175 mcg total) by mouth daily before breakfast. 30 tablet 3   Vitamin D, Ergocalciferol, (DRISDOL) 1.25 MG (50000 UT) CAPS capsule Take 1 capsule (50,000 Units total) by mouth every 7 (seven) days. 4 capsule 4   clotrimazole (LOTRIMIN) 1 % cream Apply 1 application topically 2 (two) times daily. 30 g 0   triamcinolone cream (KENALOG) 0.1 % Apply 1 application topically 2 (two) times daily. 30 g 0   amLODipine (NORVASC) 5 MG tablet TAKE 1 TABLET (5 MG TOTAL) BY MOUTH DAILY. 90 tablet 3   cetirizine (ZYRTEC) 10 MG tablet Take 1 tablet (10 mg total) by mouth daily. (Patient not taking: Reported on 02/03/2021) 30 tablet 0   meclizine (ANTIVERT) 25 MG tablet Take 1 tablet (25 mg total) by mouth 3 (three) times daily as needed for dizziness. (Patient not taking: Reported on 08/06/2021) 30 tablet 0   valACYclovir (VALTREX) 1000 MG tablet Take 1 tablet (1,000 mg total) by mouth 2 (two) times daily. (Patient not taking: Reported on 08/06/2021) 60 tablet 0   ALPRAZolam (XANAX) 0.25 MG tablet Take 1 tablet (0.25 mg total) by mouth 2 (two) times daily as needed for anxiety or sleep. (Patient not taking: Reported on 07/17/2020) 30 tablet 1   hyoscyamine (LEVSIN SL) 0.125 MG SL tablet Place 1 tablet (0.125 mg total) under the tongue every 4 (four) hours as needed. (Patient not taking: No sig reported) 30 tablet 0   No facility-administered medications prior to visit.    Allergies  Allergen Reactions   Zofran [Ondansetron Hcl] Other (See Comments)    Pt has hx of migraines; medication increases headaches and nausea    Amoxicillin Nausea And Vomiting and Other (See Comments)    Has patient had a PCN reaction causing immediate rash, facial/tongue/throat swelling, SOB or  lightheadedness with hypotension: Unknown Has patient had a PCN reaction causing severe rash involving mucus membranes or skin necrosis: No Has patient had a PCN reaction that required hospitalization: No Has patient had a PCN reaction occurring within the last 10 years: No If all of the above answers are "NO", then may proceed with Cephalosporin use.    Codeine Nausea And Vomiting   Imitrex [Sumatriptan] Nausea And  Vomiting   Ultram [Tramadol Hcl] Nausea And Vomiting    Review of Systems  Constitutional:  Negative for fever and malaise/fatigue.  HENT:  Negative for congestion.   Eyes:  Negative for blurred vision.  Respiratory:  Negative for shortness of breath.   Cardiovascular:  Negative for chest pain, palpitations and leg swelling.  Gastrointestinal:  Negative for abdominal pain, blood in stool and nausea.  Genitourinary:  Negative for dysuria and frequency.  Musculoskeletal:  Negative for falls.  Skin:  Negative for rash.  Neurological:  Negative for dizziness, loss of consciousness and headaches.  Endo/Heme/Allergies:  Negative for environmental allergies.  Psychiatric/Behavioral:  Negative for depression. The patient is nervous/anxious.       Objective:    Physical Exam Constitutional:      General: She is not in acute distress.    Appearance: She is well-developed.  HENT:     Head: Normocephalic and atraumatic.  Eyes:     Conjunctiva/sclera: Conjunctivae normal.  Neck:     Thyroid: No thyromegaly.  Cardiovascular:     Rate and Rhythm: Normal rate and regular rhythm.     Heart sounds: Normal heart sounds. No murmur heard. Pulmonary:     Effort: Pulmonary effort is normal. No respiratory distress.     Breath sounds: Normal breath sounds.  Abdominal:     General: Bowel sounds are normal. There is no distension.     Palpations: Abdomen is soft. There is no mass.     Tenderness: There is no abdominal tenderness.  Musculoskeletal:     Cervical back: Neck supple.   Lymphadenopathy:     Cervical: No cervical adenopathy.  Skin:    General: Skin is warm and dry.  Neurological:     Mental Status: She is alert and oriented to person, place, and time.  Psychiatric:        Behavior: Behavior normal.    BP 136/90    Pulse 71    Temp 98.1 F (36.7 C)    Resp 16    Ht 5\' 5"  (1.651 m)    Wt 201 lb 3.2 oz (91.3 kg)    SpO2 97%    BMI 33.48 kg/m  Wt Readings from Last 3 Encounters:  08/06/21 201 lb 3.2 oz (91.3 kg)  12/23/20 185 lb (83.9 kg)  07/17/20 205 lb (93 kg)    Diabetic Foot Exam - Simple   No data filed    Lab Results  Component Value Date   WBC 7.3 08/06/2021   HGB 14.3 08/06/2021   HCT 43.6 08/06/2021   PLT 433.0 (H) 08/06/2021   GLUCOSE 84 08/06/2021   CHOL 263 (H) 08/06/2021   TRIG 179.0 (H) 08/06/2021   HDL 61.40 08/06/2021   LDLDIRECT 167.0 07/24/2019   LDLCALC 165 (H) 08/06/2021   ALT 22 08/06/2021   AST 15 08/06/2021   NA 139 08/06/2021   K 4.5 08/06/2021   CL 102 08/06/2021   CREATININE 0.60 08/06/2021   BUN 9 08/06/2021   CO2 26 08/06/2021   TSH 0.45 08/06/2021   HGBA1C 5.1 08/06/2021    Lab Results  Component Value Date   TSH 0.45 08/06/2021   Lab Results  Component Value Date   WBC 7.3 08/06/2021   HGB 14.3 08/06/2021   HCT 43.6 08/06/2021   MCV 95.5 08/06/2021   PLT 433.0 (H) 08/06/2021   Lab Results  Component Value Date   NA 139 08/06/2021   K 4.5 08/06/2021   CO2 26 08/06/2021  GLUCOSE 84 08/06/2021   BUN 9 08/06/2021   CREATININE 0.60 08/06/2021   BILITOT 0.6 08/06/2021   ALKPHOS 67 08/06/2021   AST 15 08/06/2021   ALT 22 08/06/2021   PROT 7.3 08/06/2021   ALBUMIN 4.4 08/06/2021   CALCIUM 9.6 08/06/2021   ANIONGAP 10 11/25/2019   GFR 104.54 08/06/2021   Lab Results  Component Value Date   CHOL 263 (H) 08/06/2021   Lab Results  Component Value Date   HDL 61.40 08/06/2021   Lab Results  Component Value Date   LDLCALC 165 (H) 08/06/2021   Lab Results  Component Value Date    TRIG 179.0 (H) 08/06/2021   Lab Results  Component Value Date   CHOLHDL 4 08/06/2021   Lab Results  Component Value Date   HGBA1C 5.1 08/06/2021       Assessment & Plan:   Problem List Items Addressed This Visit     Vitamin D deficiency    Supplement and monitor      Hypothyroidism    On Levothyroxine, continue to monitor      Relevant Orders   T4, free (Completed)   Anxiety and depression    Her son's are doing well. Her job is going well but her husband has just been diagnosed with recurrent cancer. She is very sad and worried but she is managing for now. No change in meds for now.       Breast cancer screening   Relevant Orders   MM 3D SCREEN BREAST BILATERAL   Migraines    Flared with stress of her husband getting diagnosis of recurrence of his cancer but she is hopeful not to take on another med. Encouraged increased hydration, 64 ounces of clear fluids daily. Minimize alcohol and caffeine. Eat small frequent meals with lean proteins and complex carbs. Avoid high and low blood sugars. Get adequate sleep, 7-8 hours a night. Needs exercise daily preferably in the morning.      Hyperglycemia    hgba1c acceptable, minimize simple carbs. Increase exercise as tolerated.       Relevant Orders   VITAMIN D 25 Hydroxy (Vit-D Deficiency, Fractures) (Completed)   Hemoglobin A1c (Completed)   Hyperlipidemia - Primary    Encourage heart healthy diet such as MIND or DASH diet, increase exercise, avoid trans fats, simple carbohydrates and processed foods, consider a krill or fish or flaxseed oil cap daily.       Relevant Orders   Lipid panel (Completed)   Essential hypertension    Well controlled, no changes to meds. Encouraged heart healthy diet such as the DASH diet and exercise as tolerated. She notes her blood pressures have all been good at home.       Relevant Orders   T4, free (Completed)   CBC (Completed)   Comprehensive metabolic panel (Completed)   TSH  (Completed)   Class 1 obesity with serious comorbidity and body mass index (BMI) of 33.0 to 33.9 in adult    Maintain DASH or MIND diet, decrease po intake and increase exercise as tolerated. Needs 7-8 hours of sleep nightly. Avoid trans fats, eat small, frequent meals every 4-5 hours with lean proteins, complex carbs and healthy fats. Minimize simple carbs, high fat foods and processed foods      Other Visit Diagnoses     Need for Tdap vaccination       Relevant Orders   Tdap vaccine greater than or equal to 7yo IM (Completed)  I have discontinued Shavelle M. Shartzer's ALPRAZolam, hyoscyamine, clotrimazole, and triamcinolone cream. I am also having her maintain her fluticasone, cetirizine, acetaminophen, meclizine, cholecalciferol, multivitamin with minerals, Vitamin D (Ergocalciferol), valACYclovir, amLODipine, and Synthroid.  No orders of the defined types were placed in this encounter.    Penni Homans, MD

## 2021-08-07 NOTE — Assessment & Plan Note (Signed)
Maintain DASH or MIND diet, decrease po intake and increase exercise as tolerated. Needs 7-8 hours of sleep nightly. Avoid trans fats, eat small, frequent meals every 4-5 hours with lean proteins, complex carbs and healthy fats. Minimize simple carbs, high fat foods and processed foods  

## 2021-08-07 NOTE — Assessment & Plan Note (Signed)
Her son's are doing well. Her job is going well but her husband has just been diagnosed with recurrent cancer. She is very sad and worried but she is managing for now. No change in meds for now.

## 2021-08-12 ENCOUNTER — Other Ambulatory Visit: Payer: Self-pay

## 2021-08-13 ENCOUNTER — Other Ambulatory Visit: Payer: Self-pay

## 2021-08-24 ENCOUNTER — Ambulatory Visit (INDEPENDENT_AMBULATORY_CARE_PROVIDER_SITE_OTHER): Payer: No Typology Code available for payment source | Admitting: Family Medicine

## 2021-08-24 ENCOUNTER — Encounter: Payer: Self-pay | Admitting: Family Medicine

## 2021-08-24 ENCOUNTER — Other Ambulatory Visit (HOSPITAL_BASED_OUTPATIENT_CLINIC_OR_DEPARTMENT_OTHER): Payer: Self-pay

## 2021-08-24 DIAGNOSIS — F419 Anxiety disorder, unspecified: Secondary | ICD-10-CM

## 2021-08-24 DIAGNOSIS — F32A Depression, unspecified: Secondary | ICD-10-CM

## 2021-08-24 DIAGNOSIS — R739 Hyperglycemia, unspecified: Secondary | ICD-10-CM | POA: Diagnosis not present

## 2021-08-24 DIAGNOSIS — I10 Essential (primary) hypertension: Secondary | ICD-10-CM | POA: Diagnosis not present

## 2021-08-24 MED ORDER — HYDROCHLOROTHIAZIDE 12.5 MG PO CAPS
12.5000 mg | ORAL_CAPSULE | Freq: Every day | ORAL | 1 refills | Status: DC | PRN
  Filled 2021-08-24: qty 30, 30d supply, fill #0

## 2021-08-24 MED ORDER — ALPRAZOLAM 0.25 MG PO TABS
0.2500 mg | ORAL_TABLET | Freq: Two times a day (BID) | ORAL | 1 refills | Status: DC | PRN
  Filled 2021-08-24: qty 30, 15d supply, fill #0
  Filled 2021-12-21: qty 30, 15d supply, fill #1
  Filled 2021-12-24: qty 30, 15d supply, fill #0

## 2021-08-24 NOTE — Progress Notes (Signed)
Subjective:   By signing my name below, I, Katie Henry, attest that this documentation has been prepared under the direction and in the presence of [name], MD   Patient ID: Katie Henry, female    DOB: 28-Oct-1970, 51 y.o.   MRN: [id number]  Chief Complaint  Patient presents with   Dizziness   Hypertension    Dizziness Associated symptoms include weakness. Pertinent negatives include no abdominal pain, chest pain, congestion, coughing, fever, headaches, nausea or rash.  Hypertension Pertinent negatives include no chest pain, headaches, palpitations or shortness of breath.  Patient is in today for an office visit.  She reports having episodes of light-headedness and weakness last week Thursday and today. Denies chills, syncope and headaches.  Her blood pressure is elevated at this visit. She checks her blood pressure at home and the readings have been high. BP Readings from Last 3 Encounters:  08/24/21 (!) 156/92  08/06/21 136/90  02/03/21 136/88    Recheck- 152/100  She would like to start anxiety medication to help manage her mood since her husband's diagnosis. She realizes she is more stressed and has been feeling more anxious.  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   IBS (irritable bowel syndrome)    Migraine    Syncope    Vitamin D deficiency     Past Surgical History:  Procedure Laterality Date    treatment  [place]   CYSTOSCOPY/URETEROSCOPY/HOLMIUM LASER/STENT PLACEMENT Left 08/09/2017   Procedure: CYSTOSCOPY/RETROGRADE/URETEROSCOPY/HOLMIUM LASER/STENT PLACEMENT;   Surgeon: Festus Aloe, MD;  Location: [facility];  Service: Urology;  Laterality: Left;  ONLY NEEDS 60 MIN   ENDOMETRIAL ABLATION  01/2007   EXTRACORPOREAL SHOCK WAVE LITHOTRIPSY Left 07/21/2017   Procedure: LEFT EXTRACORPOREAL SHOCK WAVE LITHOTRIPSY (ESWL);  Surgeon: Bjorn Loser, MD;  Location: [facility];  Service: Urology;  Laterality: Left;   VAGINAL HYSTERECTOMY  2010   ovaries still in place    Family History  Problem Relation Age of Onset   Hyperlipidemia Mother    Heart disease Mother        bicuspid mitral valve   Hyperlipidemia Maternal Grandmother    Diabetes Maternal Grandmother    Kidney disease Maternal Grandmother    Hyperlipidemia Maternal Grandfather    Heart disease Maternal Grandfather 32       MI   Prostate cancer Maternal Grandfather    Hypertension Father    Dementia Father    Seizures Father        s/p TBI   Hypertension Paternal Grandfather    Graves' disease Sister    Obesity Brother    Prostate cancer Other        maternal and paternal grandparents   Heart attack Maternal Aunt        deceased age 43   Heart disease Maternal Aunt    Colon cancer Neg Hx    Esophageal cancer Neg Hx     Social History   Socioeconomic History   Marital status:  Married    Spouse name: Ryker Pherigo   Number of children: 2   Years of education: 13   Highest education level: Not on file  Occupational History    Employer: [employer]  Tobacco Use   Smoking status: Former    Packs/day: 0.25    Years: 6.00    Pack years: 1.50    Types: Cigarettes   Smokeless tobacco: Never   Tobacco comments:    quit 2010  Vaping Use   Vaping Use: Never used  Substance and Sexual Activity   Alcohol use: Yes    Comment: Ocassionally   Drug use: No   Sexual activity: Yes    Partners: Male    Comment: work at [employer], lives with fiance and son  Other Topics Concern   Not on file  Social History Narrative   Engaged, two sons.   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   No Tob, occ alcohol, no drugs.   Social Determinants of Health   Financial Resource Strain: Not on file  Food Insecurity: Not on file  Transportation Needs: Not on file  Physical Activity: Not on file  Stress: Not on file  Social Connections: Not on file  Intimate Partner Violence: Not on file    Outpatient Medications Prior to Visit  Medication Sig Dispense Refill   acetaminophen (TYLENOL) 500 MG tablet Take 1,000 mg by mouth every 6 (six) hours as needed for mild pain or moderate pain.     amLODipine (NORVASC) 5 MG tablet TAKE 1 TABLET (5 MG TOTAL) BY MOUTH DAILY. 90 tablet 3   atorvastatin (LIPITOR) 10 MG tablet Take 1 tablet (10 mg total) by mouth daily. 30 tablet 3   cetirizine (ZYRTEC) 10 MG tablet Take 1 tablet (10 mg total) by mouth daily. (Patient not taking: Reported on 02/03/2021) 30 tablet 0   cholecalciferol (VITAMIN D3) 25 MCG (1000 UT) tablet Take 5,000 Units by mouth daily.     fluticasone (FLONASE) 50 MCG/ACT nasal spray Place 2 sprays into both nostrils daily. 16 g 6   meclizine (ANTIVERT) 25 MG tablet Take 1 tablet (25 mg total) by mouth 3 (three) times daily as needed for dizziness. (Patient not taking: Reported on 08/06/2021) 30 tablet 0   Multiple Vitamins-Minerals (MULTIVITAMIN WITH MINERALS) tablet Take 1 tablet by mouth daily.     SYNTHROID 175 MCG tablet Take 1 tablet (175 mcg total) by mouth daily before breakfast. 30 tablet 3   valACYclovir (VALTREX) 1000 MG tablet Take 1 tablet (1,000 mg total) by mouth 2 (two) times daily. (Patient not taking: Reported on 08/06/2021) 60 tablet 0   Vitamin D, Ergocalciferol, (DRISDOL) 1.25 MG (50000 UT) CAPS capsule Take 1 capsule (50,000 Units total) by mouth every 7 (seven) days. 4 capsule 4   No facility-administered medications prior to visit.    Allergies  Allergen Reactions   Zofran [Ondansetron Hcl] Other (See Comments)    Pt has hx of migraines; medication increases headaches  and nausea    Amoxicillin Nausea And Vomiting and Other (See Comments)    Has patient had a PCN reaction causing immediate rash, facial/tongue/throat swelling, SOB or lightheadedness with hypotension: Unknown Has patient had a PCN reaction causing severe rash involving mucus membranes or skin necrosis: No Has patient had a PCN reaction that required hospitalization: No Has patient had a PCN reaction occurring within the last 10 years: No If all of the above answers are "NO", then  may proceed with Cephalosporin use.    Codeine Nausea And Vomiting   Imitrex [Sumatriptan] Nausea And Vomiting   Ultram [Tramadol Hcl] Nausea And Vomiting    Review of Systems  Constitutional:  Negative for fever.  HENT:  Negative for congestion.   Eyes:  Negative for pain.  Respiratory:  Negative for cough and shortness of breath.   Cardiovascular:  Negative for chest pain, palpitations and leg swelling.  Gastrointestinal:  Negative for abdominal pain, blood in stool, diarrhea and nausea.  Genitourinary:  Negative for dysuria and frequency.  Musculoskeletal:  Negative for back pain.  Skin:  Negative for rash.  Neurological:  Positive for dizziness and weakness. Negative for headaches.  Psychiatric/Behavioral:  Positive for depression. The patient is nervous/anxious.       Objective:    Physical Exam  BP (!) 156/92    Pulse 89    Temp 97.9 F (36.6 C)    Resp 16    SpO2 96%  Wt Readings from Last 3 Encounters:  08/06/21 201 lb 3.2 oz (91.3 kg)  12/23/20 185 lb (83.9 kg)  07/17/20 205 lb (93 kg)    Diabetic Foot Exam - Simple   No data filed    Lab Results  Component Value Date   WBC 7.3 08/06/2021   HGB 14.3 08/06/2021   HCT 43.6 08/06/2021   PLT 433.0 (H) 08/06/2021   GLUCOSE 84 08/06/2021   CHOL 263 (H) 08/06/2021   TRIG 179.0 (H) 08/06/2021   HDL 61.40 08/06/2021   LDLDIRECT 167.0 07/24/2019   LDLCALC 165 (H) 08/06/2021   ALT 22 08/06/2021   AST 15 08/06/2021   NA 139 08/06/2021    K 4.5 08/06/2021   CL 102 08/06/2021   CREATININE 0.60 08/06/2021   BUN 9 08/06/2021   CO2 26 08/06/2021   TSH 0.45 08/06/2021   HGBA1C 5.1 08/06/2021    Lab Results  Component Value Date   TSH 0.45 08/06/2021   Lab Results  Component Value Date   WBC 7.3 08/06/2021   HGB 14.3 08/06/2021   HCT 43.6 08/06/2021   MCV 95.5 08/06/2021   PLT 433.0 (H) 08/06/2021   Lab Results  Component Value Date   NA 139 08/06/2021   K 4.5 08/06/2021   CO2 26 08/06/2021   GLUCOSE 84 08/06/2021   BUN 9 08/06/2021   CREATININE 0.60 08/06/2021   BILITOT 0.6 08/06/2021   ALKPHOS 67 08/06/2021   AST 15 08/06/2021   ALT 22 08/06/2021   PROT 7.3 08/06/2021   ALBUMIN 4.4 08/06/2021   CALCIUM 9.6 08/06/2021   ANIONGAP 10 11/25/2019   GFR 104.54 08/06/2021   Lab Results  Component Value Date   CHOL 263 (H) 08/06/2021   Lab Results  Component Value Date   HDL 61.40 08/06/2021   Lab Results  Component Value Date   LDLCALC 165 (H) 08/06/2021   Lab Results  Component Value Date   TRIG 179.0 (H) 08/06/2021   Lab Results  Component Value Date   CHOLHDL 4 08/06/2021   Lab Results  Component Value Date   HGBA1C 5.1 08/06/2021       Assessment & Plan:   Problem List Items Addressed This Visit     Anxiety and depression    Notes her stress is very high, is given a prescription for Alprazolam to use prn over the coming weeks. She declines daily SSRI for now but if symptoms worsen and are more frequent she is encouraged to consider.  Relevant Medications   ALPRAZolam (XANAX) 0.25 MG tablet   Hyperglycemia    hgba1c acceptable, minimize simple carbs. Increase exercise as tolerated. Consider the possibility of her sugars dropping when she felt weak. She acknowledges not always intaking frequent enough proteins.       Essential hypertension    Was at work today when she had a second episode of feeling a little weak and light headed. They checked her blood pressure and it was  over 180 over 100 after being checked twice. She had a similar episode once about a week ago. She is under a great deal of stress as her husband is scheduled for surgery this Wednesday for his recurrent cancer. Given a prescription for HCTZ 12.5 mg to take daily prn BP>145/95. Make use to take in a resting comfortable position. Also given alprazolam to try prn when pressure and stress are high      Relevant Medications   hydrochlorothiazide (MICROZIDE) 12.5 MG capsule     Meds ordered this encounter  Medications   hydrochlorothiazide (MICROZIDE) 12.5 MG capsule    Sig: Take 1 capsule (12.5 mg total) by mouth daily as needed. For BP>145/90    Dispense:  30 capsule    Refill:  1   ALPRAZolam (XANAX) 0.25 MG tablet    Sig: Take 1 tablet (0.25 mg total) by mouth 2 (two) times daily as needed for anxiety.    Dispense:  30 tablet    Refill:  1    I, Penni Homans, MD, personally preformed the services described in this documentation.  All medical record entries made by the scribe were at my direction and in my presence.  I have reviewed the chart and discharge instructions (if applicable) and agree that the record reflects my personal performance and is accurate and complete.      Penni Homans, MD

## 2021-08-24 NOTE — Assessment & Plan Note (Signed)
Notes her stress is very high, is given a prescription for Alprazolam to use prn over the coming weeks. She declines daily SSRI for now but if symptoms worsen and are more frequent she is encouraged to consider.

## 2021-08-24 NOTE — Assessment & Plan Note (Signed)
hgba1c acceptable, minimize simple carbs. Increase exercise as tolerated. Consider the possibility of her sugars dropping when she felt weak. She acknowledges not always intaking frequent enough proteins.

## 2021-08-24 NOTE — Assessment & Plan Note (Signed)
Was at work today when she had a second episode of feeling a little weak and light headed. They checked her blood pressure and it was over 180 over 100 after being checked twice. She had a similar episode once about a week ago. She is under a great deal of stress as her husband is scheduled for surgery this Wednesday for his recurrent cancer. Given a prescription for HCTZ 12.5 mg to take daily prn BP>145/95. Make use to take in a resting comfortable position. Also given alprazolam to try prn when pressure and stress are high

## 2021-08-24 NOTE — Patient Instructions (Signed)

## 2021-09-08 ENCOUNTER — Other Ambulatory Visit (HOSPITAL_BASED_OUTPATIENT_CLINIC_OR_DEPARTMENT_OTHER): Payer: Self-pay

## 2021-09-08 ENCOUNTER — Encounter: Payer: Self-pay | Admitting: Family Medicine

## 2021-09-08 ENCOUNTER — Ambulatory Visit (INDEPENDENT_AMBULATORY_CARE_PROVIDER_SITE_OTHER): Payer: No Typology Code available for payment source | Admitting: Family

## 2021-09-08 ENCOUNTER — Encounter: Payer: Self-pay | Admitting: Family

## 2021-09-08 VITALS — BP 160/90 | HR 94 | Temp 98.4°F | Ht 63.0 in | Wt 198.2 lb

## 2021-09-08 DIAGNOSIS — I10 Essential (primary) hypertension: Secondary | ICD-10-CM | POA: Diagnosis not present

## 2021-09-08 MED ORDER — AMLODIPINE BESYLATE 5 MG PO TABS
5.0000 mg | ORAL_TABLET | Freq: Every day | ORAL | 1 refills | Status: DC
  Filled 2021-09-08: qty 90, 90d supply, fill #0
  Filled 2022-03-28: qty 90, 90d supply, fill #1

## 2021-09-08 NOTE — Progress Notes (Signed)
Katie Henry is a 51 y.o. female with the following history as recorded in EpicCare:  Patient Active Problem List   Diagnosis Date Noted   Colon polyp 02/04/2021   Decreased visual acuity 02/04/2021   Acute pain of right knee 09/26/2019   Low back pain 05/20/2019   Essential hypertension 05/15/2019   Class 1 obesity with serious comorbidity and body mass index (BMI) of 33.0 to 33.9 in adult 05/15/2019   Tinea corporis 11/27/2018   Rectal bleeding 11/27/2018   Hyperlipidemia 12/13/2017   Plantar fasciitis 01/11/2017   Tailor's bunion 01/11/2017   Grief reaction 11/15/2016   Hyperglycemia 11/15/2016   Preventative health care 10/14/2013   Graves disease    Endometriosis    IBS (irritable bowel syndrome)    Syncope    Heel pain 07/17/2012   Migraines 01/13/2012   Kidney stones 01/13/2012   Breast cancer screening 07/08/2011   Vitamin D deficiency 05/23/2011   Hypothyroidism 05/23/2011   Anxiety and depression 05/23/2011    Current Outpatient Medications  Medication Sig Dispense Refill   acetaminophen (TYLENOL) 500 MG tablet Take 1,000 mg by mouth every 6 (six) hours as needed for mild pain or moderate pain.     ALPRAZolam (XANAX) 0.25 MG tablet Take 1 tablet (0.25 mg total) by mouth 2 (two) times daily as needed for anxiety. 30 tablet 1   amLODipine (NORVASC) 5 MG tablet Take 1 tablet (5 mg total) by mouth daily. 90 tablet 1   atorvastatin (LIPITOR) 10 MG tablet Take 1 tablet (10 mg total) by mouth daily. 30 tablet 3   cetirizine (ZYRTEC) 10 MG tablet Take 1 tablet (10 mg total) by mouth daily. 30 tablet 0   cholecalciferol (VITAMIN D3) 25 MCG (1000 UT) tablet Take 5,000 Units by mouth daily.     fluticasone (FLONASE) 50 MCG/ACT nasal spray Place 2 sprays into both nostrils daily. 16 g 6   hydrochlorothiazide (MICROZIDE) 12.5 MG capsule Take 1 capsule (12.5 mg total) by mouth daily as needed. For BP>145/90 30 capsule 1   meclizine (ANTIVERT) 25 MG tablet Take 1 tablet (25 mg  total) by mouth 3 (three) times daily as needed for dizziness. 30 tablet 0   Multiple Vitamins-Minerals (MULTIVITAMIN WITH MINERALS) tablet Take 1 tablet by mouth daily.     SYNTHROID 175 MCG tablet Take 1 tablet (175 mcg total) by mouth daily before breakfast. 30 tablet 3   valACYclovir (VALTREX) 1000 MG tablet Take 1 tablet (1,000 mg total) by mouth 2 (two) times daily. 60 tablet 0   Vitamin D, Ergocalciferol, (DRISDOL) 1.25 MG (50000 UT) CAPS capsule Take 1 capsule (50,000 Units total) by mouth every 7 (seven) days. 4 capsule 4   amLODipine (NORVASC) 5 MG tablet TAKE 1 TABLET (5 MG TOTAL) BY MOUTH DAILY. 90 tablet 3   No current facility-administered medications for this visit.    Allergies: Zofran [ondansetron hcl], Amoxicillin, Codeine, Imitrex [sumatriptan], and Ultram [tramadol hcl]  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   IBS (irritable  bowel syndrome)    Migraine    Syncope    Vitamin D deficiency     Past Surgical History:  Procedure Laterality Date    treatment  [facility]   CYSTOSCOPY/URETEROSCOPY/HOLMIUM LASER/STENT PLACEMENT Left 08/09/2017   Procedure: XBMWUXLKGM/WNUUVOZDGU/YQIHKVQQVZDG/LOVFIEP LASER/STENT PLACEMENT;  Surgeon: Festus Aloe, MD;  Location: [facility];  Service: Urology;  Laterality: Left;  ONLY NEEDS 60 MIN   ENDOMETRIAL ABLATION  01/2007   EXTRACORPOREAL SHOCK WAVE LITHOTRIPSY Left 07/21/2017   Procedure: LEFT EXTRACORPOREAL SHOCK WAVE LITHOTRIPSY (ESWL);  Surgeon: Bjorn Loser, MD;  Location: [facility];  Service: Urology;  Laterality: Left;   VAGINAL HYSTERECTOMY  2010    ovaries still in place    Family History  Problem Relation Age of Onset   Hyperlipidemia Mother    Heart disease Mother        bicuspid mitral valve   Hyperlipidemia Maternal Grandmother    Diabetes Maternal Grandmother    Kidney disease Maternal Grandmother    Hyperlipidemia Maternal Grandfather    Heart disease Maternal Grandfather 58       MI   Prostate cancer Maternal Grandfather    Hypertension Father    Dementia Father    Seizures Father        s/p TBI   Hypertension Paternal Grandfather    Graves' disease Sister    Obesity Brother    Prostate cancer Other        maternal and paternal grandparents   Heart attack Maternal Aunt        deceased age 32   Heart disease Maternal Aunt    Colon cancer Neg Hx    Esophageal cancer Neg Hx     Social History   Tobacco Use   Smoking status: Former    Packs/day: 0.25    Years: 6.00    Pack years: 1.50    Types: Cigarettes   Smokeless tobacco: Never   Tobacco comments:    quit 2010  Substance Use Topics   Alcohol use: Yes    Comment: Ocassionally    Subjective:   Follow up regarding elevated blood pressure; is currently being prescribed HCTZ 12.5 mg daily prn;  Has taken Amlodipine 5 mg in the past when stress level was elevated;  Admits that stress level has been high recently with husband being treated for colon cancer; Has noticed that her blood pressure has been has high as 176/100 recently; has felt dizzy/ light headed on occasion;  Denies any chest pain, shortness of breath, blurred vision or headache.      Objective:  Vitals:   09/08/21 1350 09/08/21 1400  BP: (!) 150/80 (!) 160/90  Pulse: 94   Temp: 98.4 F (36.9 C)   TempSrc: Oral   SpO2: 96%   Weight: 198 lb 3.2 oz (89.9 kg)   Height: 5\' 3"  (1.6 m)     General: Well developed, well nourished, in no acute distress  Skin : Warm and dry.  Head: Normocephalic and atraumatic  Lungs: Respirations unlabored; clear to auscultation bilaterally without  wheeze, rales, rhonchi  CVS exam: normal rate and regular rhythm.  Neurologic: Alert and oriented; speech intact; face symmetrical; moves all extremities well; CNII-XII intact without focal deficit   Assessment:  1. Primary hypertension     Plan:  Update EKG- NSR; normal labs were done in early January; will re-start Amlodipine 5 mg and take both this and HCTZ 12.5 mg daily; she will continue to monitor her blood pressure  and keep planned follow up in April 2023;  This visit occurred during the SARS-CoV-2 public health emergency.  Safety protocols were in place, including screening questions prior to the visit, additional usage of staff PPE, and extensive cleaning of exam room while observing appropriate contact time as indicated for disinfecting solutions.    No follow-ups on file.  Orders Placed This Encounter  Procedures   EKG 12-Lead    Requested Prescriptions   Signed Prescriptions Disp Refills   amLODipine (NORVASC) 5 MG tablet 90 tablet 1    Sig: Take 1 tablet (5 mg total) by mouth daily.

## 2021-09-21 ENCOUNTER — Encounter (HOSPITAL_BASED_OUTPATIENT_CLINIC_OR_DEPARTMENT_OTHER): Payer: Self-pay

## 2021-09-21 ENCOUNTER — Ambulatory Visit (HOSPITAL_BASED_OUTPATIENT_CLINIC_OR_DEPARTMENT_OTHER)
Admission: RE | Admit: 2021-09-21 | Discharge: 2021-09-21 | Disposition: A | Discharge status: Home / Self Care | Payer: No Typology Code available for payment source | Source: Ambulatory Visit | Attending: Family Medicine | Admitting: Family Medicine

## 2021-09-21 ENCOUNTER — Other Ambulatory Visit: Payer: Self-pay

## 2021-09-21 DIAGNOSIS — Z1231 Encounter for screening mammogram for malignant neoplasm of breast: Secondary | ICD-10-CM | POA: Diagnosis not present

## 2021-10-02 ENCOUNTER — Other Ambulatory Visit: Payer: Self-pay

## 2021-10-20 ENCOUNTER — Other Ambulatory Visit: Payer: Self-pay

## 2021-10-20 ENCOUNTER — Telehealth (INDEPENDENT_AMBULATORY_CARE_PROVIDER_SITE_OTHER): Payer: No Typology Code available for payment source | Admitting: Family

## 2021-10-20 VITALS — BP 139/88 | HR 89 | Ht 63.0 in | Wt 195.0 lb

## 2021-10-20 DIAGNOSIS — E785 Hyperlipidemia, unspecified: Secondary | ICD-10-CM

## 2021-10-20 DIAGNOSIS — R7309 Other abnormal glucose: Secondary | ICD-10-CM | POA: Diagnosis not present

## 2021-10-20 DIAGNOSIS — I10 Essential (primary) hypertension: Secondary | ICD-10-CM | POA: Diagnosis not present

## 2021-10-20 MED ORDER — HYDROCHLOROTHIAZIDE 12.5 MG PO CAPS
12.5000 mg | ORAL_CAPSULE | Freq: Every day | ORAL | 1 refills | Status: DC | PRN
  Filled 2021-10-20: qty 90, 90d supply, fill #0
  Filled 2022-04-24: qty 90, 90d supply, fill #1

## 2021-10-20 MED ORDER — ATORVASTATIN CALCIUM 10 MG PO TABS
10.0000 mg | ORAL_TABLET | Freq: Every day | ORAL | 3 refills | Status: DC
  Filled 2021-10-20: qty 90, 90d supply, fill #0
  Filled 2022-03-28: qty 90, 90d supply, fill #1
  Filled 2022-08-17: qty 90, 90d supply, fill #2

## 2021-10-20 NOTE — Progress Notes (Signed)
?Katie Henry is a 51 y.o. female with the following history as recorded in EpicCare:  ?Patient Active Problem List  ? Diagnosis Date Noted  ? Colon polyp 02/04/2021  ? Decreased visual acuity 02/04/2021  ? Acute pain of right knee 09/26/2019  ? Low back pain 05/20/2019  ? Essential hypertension 05/15/2019  ? Class 1 obesity with serious comorbidity and body mass index (BMI) of 33.0 to 33.9 in adult 05/15/2019  ? Tinea corporis 11/27/2018  ? Rectal bleeding 11/27/2018  ? Hyperlipidemia 12/13/2017  ? Plantar fasciitis 01/11/2017  ? Tailor's bunion 01/11/2017  ? Grief reaction 11/15/2016  ? Hyperglycemia 11/15/2016  ? Preventative health care 10/14/2013  ? Graves disease   ? Endometriosis   ? IBS (irritable bowel syndrome)   ? Syncope   ? Heel pain 07/17/2012  ? Migraines 01/13/2012  ? Kidney stones 01/13/2012  ? Breast cancer screening 07/08/2011  ? Vitamin D deficiency 05/23/2011  ? Hypothyroidism 05/23/2011  ? Anxiety and depression 05/23/2011  ?  ?Current Outpatient Medications  ?Medication Sig Dispense Refill  ? acetaminophen (TYLENOL) 500 MG tablet Take 1,000 mg by mouth every 6 (six) hours as needed for mild pain or moderate pain.    ? ALPRAZolam (XANAX) 0.25 MG tablet Take 1 tablet (0.25 mg total) by mouth 2 (two) times daily as needed for anxiety. 30 tablet 1  ? amLODipine (NORVASC) 5 MG tablet Take 1 tablet (5 mg total) by mouth daily. 90 tablet 1  ? cetirizine (ZYRTEC) 10 MG tablet Take 1 tablet (10 mg total) by mouth daily. 30 tablet 0  ? cholecalciferol (VITAMIN D3) 25 MCG (1000 UT) tablet Take 5,000 Units by mouth daily.    ? fluticasone (FLONASE) 50 MCG/ACT nasal spray Place 2 sprays into both nostrils daily. 16 g 6  ? meclizine (ANTIVERT) 25 MG tablet Take 1 tablet (25 mg total) by mouth 3 (three) times daily as needed for dizziness. 30 tablet 0  ? Multiple Vitamins-Minerals (MULTIVITAMIN WITH MINERALS) tablet Take 1 tablet by mouth daily.    ? SYNTHROID 175 MCG tablet Take 1 tablet (175 mcg total)  by mouth daily before breakfast. 30 tablet 3  ? valACYclovir (VALTREX) 1000 MG tablet Take 1 tablet (1,000 mg total) by mouth 2 (two) times daily. 60 tablet 0  ? Vitamin D, Ergocalciferol, (DRISDOL) 1.25 MG (50000 UT) CAPS capsule Take 1 capsule (50,000 Units total) by mouth every 7 (seven) days. 4 capsule 4  ? amLODipine (NORVASC) 5 MG tablet TAKE 1 TABLET (5 MG TOTAL) BY MOUTH DAILY. 90 tablet 3  ? atorvastatin (LIPITOR) 10 MG tablet Take 1 tablet (10 mg total) by mouth daily. 90 tablet 3  ? hydrochlorothiazide (MICROZIDE) 12.5 MG capsule Take 1 capsule (12.5 mg total) by mouth daily as needed. For BP>145/90 90 capsule 1  ? ?No current facility-administered medications for this visit.  ?  ?Allergies: Zofran [ondansetron hcl], Amoxicillin, Codeine, Imitrex [sumatriptan], and Ultram [tramadol hcl]  ?Past Medical History:  ?Diagnosis Date  ? Anxiety   ? Back pain   ? Dental infection 05/28/2011  ? Endometriosis   ? Fainting episodes   ? Dr Luther Parody related  ? Graves disease   ? Grief reaction 11/15/2016  ? History of kidney stones 2003  ? onset with pregnacy   ? History of UTI   ? last 4-5-weeks ago  ? Hyperglycemia 11/15/2016  ? cbg 100  ? Hyperthyroidism   ? Hypothyroidism   ? hypo after radioactibe iodine  ? IBS (irritable  bowel syndrome)   ? Migraine   ? Syncope   ? Vitamin D deficiency   ?  ?Past Surgical History:  ?Procedure Laterality Date  ?  treatment  1998  ? APPENDECTOMY  1991  ? CYSTOSCOPY/URETEROSCOPY/HOLMIUM LASER/STENT PLACEMENT Left 08/09/2017  ? Procedure: UYQIHKVQQV/ZDGLOVFIEP/PIRJJOACZYSA/YTKZSWF LASER/STENT PLACEMENT;  Surgeon: Festus Aloe, MD;  Location: [facility];  Service: Urology;  Laterality: Left;  ONLY NEEDS 60 MIN  ? ENDOMETRIAL ABLATION  01/2007  ? EXTRACORPOREAL SHOCK WAVE LITHOTRIPSY Left 07/21/2017  ? Procedure: LEFT EXTRACORPOREAL SHOCK WAVE LITHOTRIPSY (ESWL);  Surgeon: Bjorn Loser, MD;  Location: [facility];  Service: Urology;  Laterality: Left;  ? VAGINAL HYSTERECTOMY  2010  ?  ovaries still in place  ?  ?Family History  ?Problem Relation Age of Onset  ? Hyperlipidemia Mother   ? Heart disease Mother   ?     bicuspid mitral valve  ? Hyperlipidemia Maternal Grandmother   ? Diabetes Maternal Grandmother   ? Kidney disease Maternal Grandmother   ? Hyperlipidemia Maternal Grandfather   ? Heart disease Maternal Grandfather 30  ?     MI  ? Prostate cancer Maternal Grandfather   ? Hypertension Father   ? Dementia Father   ? Seizures Father   ?     s/p TBI  ? Hypertension Paternal Grandfather   ? Graves' disease Sister   ? Obesity Brother   ? Prostate cancer Other   ?     maternal and paternal grandparents  ? Heart attack Maternal Aunt   ?     deceased age 62  ? Heart disease Maternal Aunt   ? Colon cancer Neg Hx   ? Esophageal cancer Neg Hx   ?  ?Social History  ? ?Tobacco Use  ? Smoking status: Former  ?  Packs/day: 0.25  ?  Years: 6.00  ?  Pack years: 1.50  ?  Types: Cigarettes  ? Smokeless tobacco: Never  ? Tobacco comments:  ?  quit 2010  ?Substance Use Topics  ? Alcohol use: Yes  ?  Comment: Ocassionally  ?  ?Subjective:  ? ? ?I connected with Katie Henry on 10/20/21 at  9:20 AM EDT by a telephone call and verified that I am speaking with the correct person using two identifiers. ?  ?I discussed the limitations of evaluation and management by telemedicine and the availability of in person appointments. The patient expressed understanding and agreed to proceed. Provider in office/ patient is at home; provider and patient are only 2 people on telephone call.  ? ?1 month follow up on hypertension; at last OV, patient's stress level was very high and added Amlodipine 5 mg to HCTZ 12.5 mg daily; she notes she has been hesitant to take both medication daily for fear that pressure will go too low; today's reading is one of the best she has seen; Denies any chest pain, shortness of breath, blurred vision or headache ? ? ?Objective:  ?Vitals:  ? 10/20/21 0918  ?BP: 139/88  ?Pulse: 89  ?Weight: 195  lb (88.5 kg)  ?Height: $RemoveB'5\' 3"'DRiEfnbu$  (1.6 m)  ?  ?Lungs: Respirations unlabored;  ?Neurologic: Alert and oriented; speech intact; face symmetrical; moves all extremities well; CNII-XII intact without focal deficit  ?Assessment:  ?1. Primary hypertension   ?2. Elevated glucose   ?3. Hyperlipidemia, unspecified hyperlipidemia type   ?  ?Plan:  ?Patient is encouraged to start taking both medications daily; refills updated; she will call back with readings in 2 weeks;  ?  Check Hgba1c; ?Updated refill for Atorvastatin; she will plan to get labs done in the next 1-2 weeks at [facility];  ? ?Time spent 12 minutes ? ?No follow-ups on file.  ?Orders Placed This Encounter  ?Procedures  ? CBC with Differential/Platelet  ?  Standing Status:   Future  ?  Standing Expiration Date:   10/21/2022  ? Comp Met (CMET)  ?  Standing Status:   Future  ?  Standing Expiration Date:   10/21/2022  ? Hemoglobin A1c  ?  Standing Status:   Future  ?  Standing Expiration Date:   10/21/2022  ? Lipid panel  ?  Standing Status:   Future  ?  Standing Expiration Date:   10/21/2022  ?  ?Requested Prescriptions  ? ?Signed Prescriptions Disp Refills  ? hydrochlorothiazide (MICROZIDE) 12.5 MG capsule 90 capsule 1  ?  Sig: Take 1 capsule (12.5 mg total) by mouth daily as needed. For BP>145/90  ? atorvastatin (LIPITOR) 10 MG tablet 90 tablet 3  ?  Sig: Take 1 tablet (10 mg total) by mouth daily.  ?  ? ?

## 2021-10-26 ENCOUNTER — Other Ambulatory Visit: Payer: Self-pay

## 2021-10-27 ENCOUNTER — Other Ambulatory Visit: Payer: Self-pay

## 2021-10-27 ENCOUNTER — Telehealth: Payer: No Typology Code available for payment source | Admitting: Family

## 2021-10-30 ENCOUNTER — Other Ambulatory Visit (INDEPENDENT_AMBULATORY_CARE_PROVIDER_SITE_OTHER): Payer: No Typology Code available for payment source

## 2021-10-30 DIAGNOSIS — E038 Other specified hypothyroidism: Secondary | ICD-10-CM | POA: Diagnosis not present

## 2021-10-30 DIAGNOSIS — E785 Hyperlipidemia, unspecified: Secondary | ICD-10-CM

## 2021-10-30 DIAGNOSIS — I10 Essential (primary) hypertension: Secondary | ICD-10-CM

## 2021-10-30 DIAGNOSIS — R7309 Other abnormal glucose: Secondary | ICD-10-CM | POA: Diagnosis not present

## 2021-10-30 DIAGNOSIS — E559 Vitamin D deficiency, unspecified: Secondary | ICD-10-CM

## 2021-10-30 LAB — CBC WITH DIFFERENTIAL/PLATELET
Basophils Absolute: 0.1 10*3/uL (ref 0.0–0.1)
Basophils Relative: 0.7 % (ref 0.0–3.0)
Eosinophils Absolute: 0.1 10*3/uL (ref 0.0–0.7)
Eosinophils Relative: 0.8 % (ref 0.0–5.0)
HCT: 42.6 % (ref 36.0–46.0)
Hemoglobin: 14.4 g/dL (ref 12.0–15.0)
Lymphocytes Relative: 30.7 % (ref 12.0–46.0)
Lymphs Abs: 2.7 10*3/uL (ref 0.7–4.0)
MCHC: 33.9 g/dL (ref 30.0–36.0)
MCV: 91.6 fl (ref 78.0–100.0)
Monocytes Absolute: 0.8 10*3/uL (ref 0.1–1.0)
Monocytes Relative: 8.7 % (ref 3.0–12.0)
Neutro Abs: 5.1 10*3/uL (ref 1.4–7.7)
Neutrophils Relative %: 59.1 % (ref 43.0–77.0)
Platelets: 395 10*3/uL (ref 150.0–400.0)
RBC: 4.65 Mil/uL (ref 3.87–5.11)
RDW: 12.4 % (ref 11.5–15.5)
WBC: 8.7 10*3/uL (ref 4.0–10.5)

## 2021-10-30 LAB — COMPREHENSIVE METABOLIC PANEL
ALT: 24 U/L (ref 0–35)
AST: 16 U/L (ref 0–37)
Albumin: 4.1 g/dL (ref 3.5–5.2)
Alkaline Phosphatase: 56 U/L (ref 39–117)
BUN: 9 mg/dL (ref 6–23)
CO2: 26 mEq/L (ref 19–32)
Calcium: 9.9 mg/dL (ref 8.4–10.5)
Chloride: 103 mEq/L (ref 96–112)
Creatinine, Ser: 0.57 mg/dL (ref 0.40–1.20)
GFR: 105.67 mL/min (ref >60.00)
Glucose, Bld: 95 mg/dL (ref 70–99)
Potassium: 4.2 mEq/L (ref 3.5–5.1)
Sodium: 134 mEq/L — ABNORMAL LOW (ref 135–145)
Total Bilirubin: 0.5 mg/dL (ref 0.2–1.2)
Total Protein: 6.9 g/dL (ref 6.0–8.3)

## 2021-10-30 LAB — LIPID PANEL
Cholesterol: 171 mg/dL (ref 0–200)
HDL: 52.6 mg/dL (ref >39.00)
LDL Cholesterol: 94 mg/dL (ref 0–99)
NonHDL: 118.07
Total CHOL/HDL Ratio: 3
Triglycerides: 119 mg/dL (ref 0.0–149.0)
VLDL: 23.8 mg/dL (ref 0.0–40.0)

## 2021-10-30 LAB — TSH: TSH: 0.21 u[IU]/mL — ABNORMAL LOW (ref 0.35–5.50)

## 2021-10-30 LAB — T4, FREE: Free T4: 1.02 ng/dL (ref 0.60–1.60)

## 2021-10-30 LAB — HEMOGLOBIN A1C: Hgb A1c MFr Bld: 5.1 % (ref 4.6–6.5)

## 2021-11-04 LAB — VITAMIN D 1,25 DIHYDROXY
Vitamin D 1, 25 (OH)2 Total: 77 pg/mL — ABNORMAL HIGH (ref 18–72)
Vitamin D2 1, 25 (OH)2: 13 pg/mL
Vitamin D3 1, 25 (OH)2: 64 pg/mL

## 2021-12-21 ENCOUNTER — Other Ambulatory Visit: Payer: Self-pay

## 2021-12-21 ENCOUNTER — Other Ambulatory Visit: Payer: Self-pay | Admitting: Family Medicine

## 2021-12-21 ENCOUNTER — Other Ambulatory Visit (HOSPITAL_BASED_OUTPATIENT_CLINIC_OR_DEPARTMENT_OTHER): Payer: Self-pay

## 2021-12-21 MED ORDER — SYNTHROID 175 MCG PO TABS
175.0000 ug | ORAL_TABLET | Freq: Every day | ORAL | 3 refills | Status: DC
  Filled 2021-12-21: qty 30, 30d supply, fill #0
  Filled 2022-02-19: qty 30, 30d supply, fill #1
  Filled 2022-03-28: qty 30, 30d supply, fill #2
  Filled 2022-04-24 – 2022-04-26 (×2): qty 30, 30d supply, fill #3

## 2021-12-24 ENCOUNTER — Other Ambulatory Visit (HOSPITAL_COMMUNITY): Payer: Self-pay

## 2021-12-24 ENCOUNTER — Other Ambulatory Visit: Payer: Self-pay

## 2021-12-24 ENCOUNTER — Other Ambulatory Visit (HOSPITAL_BASED_OUTPATIENT_CLINIC_OR_DEPARTMENT_OTHER): Payer: Self-pay

## 2021-12-31 ENCOUNTER — Encounter: Payer: Self-pay | Admitting: Gastroenterology

## 2022-01-05 ENCOUNTER — Encounter: Payer: Self-pay | Admitting: Family Medicine

## 2022-01-05 ENCOUNTER — Other Ambulatory Visit (HOSPITAL_BASED_OUTPATIENT_CLINIC_OR_DEPARTMENT_OTHER): Payer: Self-pay

## 2022-01-05 ENCOUNTER — Telehealth: Payer: Self-pay

## 2022-01-05 ENCOUNTER — Ambulatory Visit (INDEPENDENT_AMBULATORY_CARE_PROVIDER_SITE_OTHER): Payer: No Typology Code available for payment source | Admitting: Family Medicine

## 2022-01-05 DIAGNOSIS — J0100 Acute maxillary sinusitis, unspecified: Secondary | ICD-10-CM

## 2022-01-05 MED ORDER — CETIRIZINE HCL 10 MG PO TABS
10.0000 mg | ORAL_TABLET | Freq: Every day | ORAL | 0 refills | Status: DC
  Filled 2022-01-05: qty 100, 100d supply, fill #0

## 2022-01-05 MED ORDER — PREDNISONE 20 MG PO TABS
40.0000 mg | ORAL_TABLET | Freq: Every day | ORAL | 0 refills | Status: AC
  Filled 2022-01-05: qty 10, 5d supply, fill #0

## 2022-01-05 MED ORDER — DOXYCYCLINE HYCLATE 100 MG PO TABS
100.0000 mg | ORAL_TABLET | Freq: Two times a day (BID) | ORAL | 0 refills | Status: AC
  Filled 2022-01-05: qty 20, 10d supply, fill #0

## 2022-01-05 MED ORDER — FLUTICASONE PROPIONATE 50 MCG/ACT NA SUSP
2.0000 | Freq: Every day | NASAL | 6 refills | Status: DC
  Filled 2022-01-05: qty 16, 30d supply, fill #0
  Filled 2022-03-28: qty 16, 30d supply, fill #1
  Filled 2022-12-15: qty 16, 30d supply, fill #0

## 2022-01-05 NOTE — Telephone Encounter (Signed)
Appt scheduled

## 2022-01-05 NOTE — Progress Notes (Signed)
Acute Office Visit  Subjective:     Patient ID: Katie Henry, female    DOB: 17-Nov-1970, 51 y.o.   MRN: [id number]  CC: sinusitis   HPI Patient is in today for sinusitis symptoms.   Patient reports she has been struggling with allergies all spring, however a little over a week ago they started getting gradually worse.  Over the past few days she has developed severe sinus pressure (currently 7/10, but can fluctuate), dry cough, rhinorrhea, nasal congestion with a lot of green mucus, postnasal drainage, bilateral ear pressure/popping, and has now lost her voice.  She has been taking Zyrtec, Flonase, ibuprofen.  She denies any recent sick contacts.  She has not had any chest pain, dyspnea, wheezing, fevers, GI/GU symptoms.  She has not been on any antibiotics recently.     ROS All review of systems negative except what is listed in the HPI      Objective:    BP 132/86   Pulse 100   Temp 98.1 F (36.7 C)   Ht '5\' 3"'$  (1.6 m)   Wt 194 lb 9.6 oz (88.3 kg)   SpO2 98%   BMI 34.47 kg/m    Physical Exam Vitals reviewed.  Constitutional:      General: She is not in acute distress.    Appearance: Normal appearance. She is not ill-appearing.  HENT:     Head: Normocephalic and atraumatic.     Nose: Congestion and rhinorrhea present.     Mouth/Throat:     Mouth: Mucous membranes are moist.     Pharynx: Oropharynx is clear. No oropharyngeal exudate or posterior oropharyngeal erythema.  Eyes:     Extraocular Movements: Extraocular movements intact.     Conjunctiva/sclera: Conjunctivae normal.  Cardiovascular:     Rate and Rhythm: Normal rate and regular rhythm.  Pulmonary:     Effort: Pulmonary effort is normal.     Breath sounds: Normal breath sounds.  Musculoskeletal:     Cervical back: Normal range of motion and neck supple. No tenderness.  Lymphadenopathy:     Cervical: No cervical adenopathy.  Skin:    General: Skin is warm and dry.     Capillary Refill: Capillary  refill takes less than 2 seconds.  Neurological:     General: No focal deficit present.     Mental Status: She is alert and oriented to person, place, and time. Mental status is at baseline.  Psychiatric:        Mood and Affect: Mood normal.        Behavior: Behavior normal.        Thought Content: Thought content normal.        Judgment: Judgment normal.       No results found for any visits on 01/05/22.      Assessment & Plan:   1. Acute maxillary sinusitis, recurrence not specified Given duration and severity, sending in antibiotics and steroids and refilling your nasal spray and antihistamine. Information attached to AVS. Continue supportive measures including rest, hydration, humidifier use, steam showers, warm compresses to sinuses, warm liquids with lemon and honey, and over-the-counter cough, cold, and analgesics as needed.   Patient aware of signs/symptoms requiring further/urgent evaluation.   - doxycycline (VIBRA-TABS) 100 MG tablet; Take 1 tablet (100 mg total) by mouth 2 (two) times daily for 10 days.  Dispense: 20 tablet; Refill: 0 - predniSONE (DELTASONE) 20 MG tablet; Take 2 tablets (40 mg total) by mouth daily with breakfast for  5 days.  Dispense: 10 tablet; Refill: 0 - cetirizine (ZYRTEC) 10 MG tablet; Take 1 tablet (10 mg total) by mouth daily.  Dispense: 30 tablet; Refill: 0 - fluticasone (FLONASE) 50 MCG/ACT nasal spray; Place 2 sprays into both nostrils daily.  Dispense: 16 g; Refill: 6   Return if symptoms worsen or fail to improve.  Terrilyn Saver, NP

## 2022-01-05 NOTE — Telephone Encounter (Signed)
Nurse Assessment Nurse: Vallery Sa, RN, Cathy Date/Time (Eastern Time): 01/05/2022 7:37:20 AM Confirm and document reason for call. If symptomatic, describe symptoms. ---Katie Henry states she developed sinus pain and hoarseness yesterday. No fever. No severe breathing difficulty or blueness around her lips. Alert and responsive. Does the patient have any new or worsening symptoms? ---Yes Will a triage be completed? ---Yes Related visit to physician within the last 2 weeks? ---No Does the PT have any chronic conditions? (i.e. diabetes, asthma, this includes High risk factors for pregnancy, etc.) ---Yes List chronic conditions. ---Allergies, Thyroid problems, Vitamin D low, High Blood Pressure and Cholesterol Is the patient pregnant or possibly pregnant? (Ask all females between the ages of 19-55) ---No Is this a behavioral health or substance abuse call? ---No Guidelines Guideline Title Affirmed Question Affirmed Notes Nurse Date/Time Eilene Ghazi Time) Sinus Pain or Congestion Orvan July, RN, Cathy 01/05/2022 7:40:18 AM Disp. Time Eilene Ghazi Time) Disposition Final User 01/05/2022 7:43:52 AM See PCP within 24 Hours Yes [place], RN, [place] PLEASE NOTE: All timestamps contained within this report are represented as [place] Standard Time. CONFIDENTIALTY NOTICE: This fax transmission is intended only for the addressee. It contains information that is legally privileged, confidential or otherwise protected from use or disclosure. If you are not the intended recipient, you are strictly prohibited from reviewing, disclosing, copying using or disseminating any of this information or taking any action in reliance on or regarding this information. If you have received this fax in error, please notify us immediately by telephone so that we can arrange for its return to [place]. Phone: [phone], Toll-Free: [phone], Fax: [phone] Page: 2 of 2 Call Id: [id number] [place] Disagree/Comply  Comply Caller Understands Yes PreDisposition Go to Urgent Care/Walk-In Clinic Care Advice Given Per Guideline SEE PCP WITHIN 24 HOURS: * IF OFFICE WILL BE OPEN: You need to be examined within the next 24 hours. Call your doctor (or NP/PA) when the office opens and make an appointment. NASAL WASHES FOR A STUFFY NOSE: * Introduction: Saline (salt water) nasal irrigation (nasal wash) is an effective and simple home remedy for treating stEuffy nose and sinus congestion. The nose can be irrigated by pouring, spraying, or squirting salt water into the nose and then letting it run back out. PAIN MEDICINES: * For pain relief, you can take either acetaminophen, ibuprofen, or naproxen. * ACETAMINOPHEN - REGULAR STRENGTH TYLENOL: Take 650 mg (two 325 mg pills) by mouth every 4 to 6 hours as needed. Each Regular Strength Tylenol pill has 325 mg of acetaminophen. The most you should take is 10 pills a day (3,250 mg total). Note: In [place], the maximum is 12 pills a day (3,900 mg total). COLD PACK FOR EAR PAIN: * Apply a cold pack or a cold wet washcloth to outer ear for 20 minutes to reduce pain while medicine takes effect. Note: Some adults prefer local heat for 20 minutes. CALL BACK IF: * Difficulty breathing (and not relieved by cleaning out nose) * You become worse CARE ADVICE given per Sinus Pain or Congestion (Adult) guideline. Referrals REFERRED TO PCP OFFICE

## 2022-01-05 NOTE — Patient Instructions (Signed)
So sorry you aren't feeling well! Sending in antibiotics and steroids and refilling your nasal spray and antihistamine. Continue supportive measures including rest, hydration, humidifier use, steam showers, warm compresses to sinuses, warm liquids with lemon and honey, and over-the-counter cough, cold, and analgesics as needed.    Please contact office for follow-up if symptoms do not improve or worsen. Seek emergency care if symptoms become severe.

## 2022-01-15 ENCOUNTER — Other Ambulatory Visit: Payer: Self-pay

## 2022-01-15 ENCOUNTER — Ambulatory Visit (AMBULATORY_SURGERY_CENTER): Payer: Self-pay

## 2022-01-15 VITALS — Ht 63.0 in | Wt 196.0 lb

## 2022-01-15 DIAGNOSIS — Z86010 Personal history of colonic polyps: Secondary | ICD-10-CM

## 2022-01-15 MED ORDER — NA SULFATE-K SULFATE-MG SULF 17.5-3.13-1.6 GM/177ML PO SOLN
1.0000 | Freq: Once | ORAL | 0 refills | Status: AC
  Filled 2022-01-15: qty 354, 1d supply, fill #0

## 2022-01-15 NOTE — Progress Notes (Addendum)
No egg or soy allergy known to patient  No issues known to pt with past sedation with any surgeries or procedures Patient denies ever being told they had issues or difficulty with intubation  No FH of Malignant Hyperthermia Pt is not on diet pills Pt is not on  home 02  Pt is not on blood thinners  Pt denies issues with constipation  No A fib or A flutter   NO PA's for preps discussed with pt In [facility] today  Discussed with pt there will be an out-of-pocket cost for prep and that varies from $0 to 70 +  dollars - pt verbalized understanding  Pt instructed to use Singlecare.com or GoodRx for a [place] reduction on prep    Pt encouraged to call with questions or issues.  If pt has My chart, procedure instructions also sent via My Chart  Insurance confirmed with pt at [facility] today

## 2022-02-09 ENCOUNTER — Ambulatory Visit (INDEPENDENT_AMBULATORY_CARE_PROVIDER_SITE_OTHER): Payer: No Typology Code available for payment source | Admitting: Family Medicine

## 2022-02-09 ENCOUNTER — Encounter: Payer: Self-pay | Admitting: Family Medicine

## 2022-02-09 VITALS — BP 130/88 | HR 85 | Resp 20 | Ht 63.0 in | Wt 190.0 lb

## 2022-02-09 DIAGNOSIS — E559 Vitamin D deficiency, unspecified: Secondary | ICD-10-CM | POA: Diagnosis not present

## 2022-02-09 DIAGNOSIS — E038 Other specified hypothyroidism: Secondary | ICD-10-CM | POA: Diagnosis not present

## 2022-02-09 DIAGNOSIS — E785 Hyperlipidemia, unspecified: Secondary | ICD-10-CM | POA: Diagnosis not present

## 2022-02-09 DIAGNOSIS — R739 Hyperglycemia, unspecified: Secondary | ICD-10-CM

## 2022-02-09 DIAGNOSIS — K635 Polyp of colon: Secondary | ICD-10-CM

## 2022-02-09 DIAGNOSIS — Z0000 Encounter for general adult medical examination without abnormal findings: Secondary | ICD-10-CM

## 2022-02-09 DIAGNOSIS — F32A Depression, unspecified: Secondary | ICD-10-CM

## 2022-02-09 DIAGNOSIS — I10 Essential (primary) hypertension: Secondary | ICD-10-CM | POA: Diagnosis not present

## 2022-02-09 DIAGNOSIS — F419 Anxiety disorder, unspecified: Secondary | ICD-10-CM

## 2022-02-09 NOTE — Assessment & Plan Note (Signed)
Supplement and monitor 

## 2022-02-09 NOTE — Assessment & Plan Note (Addendum)
Still struggling with her husband's cancer diagnosis, he has had a rough week. 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 May continue this use. Has set up an appt with [place] this week.

## 2022-02-09 NOTE — Assessment & Plan Note (Signed)
Patient encouraged to maintain heart healthy diet, regular exercise, adequate sleep. Consider daily probiotics. Take medications as prescribed. Labs ordered and reviewed. Pap due she will proceed with OB/GYN. Colonoscopy 12/2018 is proceeding with repeat colonoscopy next month. MGM March 2023 repeat next year

## 2022-02-09 NOTE — Progress Notes (Signed)
Subjective:   By signing my name below, I, Kellie Simmering, attest that this documentation has been prepared under the direction and in the presence of [name], MD 02/09/2022.    Patient ID: Katie Henry, female    DOB: [birth year], 51 y.o.   MRN: [id number]  Chief Complaint  Patient presents with   Annual Exam   HPI Patient is in today for a comprehensive physical exam.  She denies having any fever, ear pain, new muscle pain, new moles, congestion, sinus pain, sore throat, chest pain, palpitations, wheezing, nausea, vomitting, diarrhea, constipation, blood in stool, dysuria, frequency and hematuria at this time.   Stress: She reports that she takes Alprazolam 0.25 mg as needed, taking it rarely and only when feeling overwhelmed. She has a therapy appointment today with [facility]. She declines needing other medications to help manage her stress.   Migraines: She reports that she feels an aura in her eyes when she has migraines. She states that her migraines are related to stress and have not bothered her these past few days.  Vitamin D: She is currently taking Vitamin D 1.25 mg.   Family history: She states that husband has cancer. She reports no new changes to her family history.   Immunizations: She is due for her shingles immunization but is interested in receiving it.   Diet: She reports that she infuses her water with berries and has a well-managed diet. She currently takes multi-vitamins and fish oil supplements. She states that her weight has decreased. 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   IBS (irritable bowel syndrome)    Migraine    Syncope    Vitamin D deficiency     Past Surgical History:  Procedure Laterality Date    treatment  [place]  2020   ssp   CYSTOSCOPY/URETEROSCOPY/HOLMIUM LASER/STENT PLACEMENT Left 08/09/2017   Procedure: CYSTOSCOPY/RETROGRADE/URETEROSCOPY/HOLMIUM LASER/STENT PLACEMENT;  Surgeon: Festus Aloe, MD;  Location: [facility];  Service: Urology;  Laterality: Left;  ONLY NEEDS 60 MIN   ENDOMETRIAL ABLATION  01/2007   EXTRACORPOREAL SHOCK WAVE LITHOTRIPSY Left 07/21/2017   Procedure: LEFT EXTRACORPOREAL SHOCK WAVE LITHOTRIPSY (ESWL);  Surgeon: Bjorn Loser, MD;  Location: [facility];  Service: Urology;  Laterality: Left;   VAGINAL HYSTERECTOMY  2010   ovaries still in place    Family History  Problem Relation Age of Onset   Hyperlipidemia Mother    Heart disease Mother        bicuspid mitral valve   Hypertension Father    Dementia Father    Seizures Father        s/p TBI   Graves' disease Sister    Obesity Brother    Heart attack Maternal Aunt        deceased age 58   Heart disease Maternal Aunt    Stomach cancer Paternal Uncle  Hyperlipidemia Maternal Grandmother    Diabetes Maternal Grandmother    Kidney disease Maternal Grandmother    Hyperlipidemia Maternal Grandfather    Heart disease Maternal Grandfather 6       MI   Prostate cancer Maternal Grandfather    Hypertension Paternal Grandfather    Prostate cancer Other        maternal and paternal grandparents   Colon cancer Neg Hx    Esophageal cancer Neg Hx    Colon polyps Neg Hx    Rectal cancer Neg Hx     Social History   Socioeconomic History   Marital status: Married    Spouse name: Tomeshia Pizzi   Number of children: 2   Years of education: 13   Highest education level: Not on file  Occupational History    Employer: [employer]  Tobacco Use   Smoking  status: Former    Packs/day: 0.25    Years: 6.00    Total pack years: 1.50    Types: Cigarettes   Smokeless tobacco: Never   Tobacco comments:    quit 2010  Vaping Use   Vaping Use: Never used  Substance and Sexual Activity   Alcohol use: Not Currently    Comment: Ocassionally   Drug use: Never   Sexual activity: Yes    Partners: Male    Birth control/protection: Post-menopausal    Comment: work at [employer], lives with fiance and son  Other Topics Concern   Not on file  Social History Narrative   Engaged, two sons.   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   No Tob, occ alcohol, no drugs.   Social Determinants of Health   Financial Resource Strain: Not on file  Food Insecurity: Not on file  Transportation Needs: Not on file  Physical Activity: Not on file  Stress: Not on file  Social Connections: Not on file  Intimate Partner Violence: Not on file    Outpatient Medications Prior to Visit  Medication Sig Dispense Refill   acetaminophen (TYLENOL) 500 MG tablet Take 1,000 mg by mouth every 6 (six) hours as needed for mild pain or moderate pain.     ALPRAZolam (XANAX) 0.25 MG tablet Take 1 tablet (0.25 mg total) by mouth 2 (two) times daily as needed for anxiety. 30 tablet 1   amLODipine (NORVASC) 5 MG tablet Take 1 tablet (5 mg total) by mouth daily. 90 tablet 1   atorvastatin (LIPITOR) 10 MG tablet Take 1 tablet (10 mg total) by mouth daily. 90 tablet 3   cetirizine (ZYRTEC) 10 MG tablet Take 1 tablet (10 mg total) by mouth daily. (Patient taking differently: Take 10 mg by mouth daily as needed.) 100 tablet 0   cholecalciferol (VITAMIN D3) 25 MCG (1000 UT) tablet Take 5,000 Units by mouth daily.     fluticasone (FLONASE) 50 MCG/ACT nasal spray Place 2 sprays into both nostrils daily. 16 g 6   hydrochlorothiazide (MICROZIDE) 12.5 MG capsule Take 1 capsule (12.5 mg total) by mouth daily as needed. For BP>145/90 90 capsule 1   meclizine  (ANTIVERT) 25 MG tablet Take 1 tablet (25 mg total) by mouth 3 (three) times daily as needed for dizziness. 30 tablet 0   Multiple Vitamins-Minerals (MULTIVITAMIN WITH MINERALS) tablet Take 1 tablet by mouth daily.     SYNTHROID 175 MCG tablet Take 1 tablet (175 mcg total) by mouth daily before breakfast. 30 tablet 3   valACYclovir (VALTREX) 1000 MG tablet  Take 1 tablet (1,000 mg total) by mouth 2 (two) times daily. (Patient taking differently: Take 1,000 mg by mouth 2 (two) times daily as needed.) 60 tablet 0   Vitamin D, Ergocalciferol, (DRISDOL) 1.25 MG (50000 UT) CAPS capsule Take 1 capsule (50,000 Units total) by mouth every 7 (seven) days. 4 capsule 4   No facility-administered medications prior to visit.    Allergies  Allergen Reactions   Zofran [Ondansetron Hcl] Other (See Comments)    Pt has hx of migraines; medication increases headaches and nausea    Amoxicillin Nausea And Vomiting and Other (See Comments)    Has patient had a PCN reaction causing immediate rash, facial/tongue/throat swelling, SOB or lightheadedness with hypotension: Unknown Has patient had a PCN reaction causing severe rash involving mucus membranes or skin necrosis: No Has patient had a PCN reaction that required hospitalization: No Has patient had a PCN reaction occurring within the last 10 years: No If all of the above answers are "NO", then may proceed with Cephalosporin use.    Codeine Nausea And Vomiting   Imitrex [Sumatriptan] Nausea And Vomiting   Ultram [Tramadol Hcl] Nausea And Vomiting    Review of Systems  Constitutional:  Negative for fever.  HENT:  Negative for congestion, ear pain, sinus pain and sore throat.   Respiratory:  Negative for cough.   Cardiovascular:  Negative for chest pain and palpitations.  Gastrointestinal:  Negative for abdominal pain, blood in stool, constipation, diarrhea, nausea and vomiting.  Genitourinary:  Negative for dysuria, frequency, hematuria and urgency.   Musculoskeletal:  Negative for joint pain and myalgias.  Skin:  Negative for itching and rash.       (-) new moles       Objective:    Physical Exam Constitutional:      General: She is not in acute distress.    Appearance: Normal appearance. She is not ill-appearing.  HENT:     Head: Normocephalic and atraumatic.     Right Ear: Tympanic membrane, ear canal and external ear normal.     Left Ear: Tympanic membrane, ear canal and external ear normal.  Eyes:     Extraocular Movements: Extraocular movements intact.     Right eye: No nystagmus.     Left eye: No nystagmus.     Pupils: Pupils are equal, round, and reactive to light.  Neck:     Vascular: No carotid bruit.  Cardiovascular:     Rate and Rhythm: Normal rate and regular rhythm.     Pulses: Normal pulses.     Heart sounds: Normal heart sounds. No murmur heard.    No gallop.     Comments: (-) bilateral pedal tenderness Pulmonary:     Effort: Pulmonary effort is normal. No respiratory distress.     Breath sounds: Normal breath sounds. No wheezing or rales.  Abdominal:     General: Bowel sounds are normal.     Palpations: Abdomen is soft.     Tenderness: There is no abdominal tenderness. There is no guarding.  Musculoskeletal:     Comments: Muscle strength 5/5 on upper and lower extremities.  Lymphadenopathy:     Cervical: No cervical adenopathy.  Skin:    General: Skin is warm and dry.  Neurological:     Mental Status: She is alert and oriented to person, place, and time.     Deep Tendon Reflexes:     Reflex Scores:      Patellar reflexes are 2+ on the right  side and 2+ on the left side. Psychiatric:        Mood and Affect: Mood normal.        Behavior: Behavior normal.        Judgment: Judgment normal.     BP 130/88 (BP Location: Left Arm, Patient Position: Sitting, Cuff Size: Normal)   Pulse 85   Resp 20   Ht '5\' 3"'$  (1.6 m)   Wt 190 lb (86.2 kg)   BMI 33.66 kg/m  Wt Readings from Last 3 Encounters:   02/09/22 190 lb (86.2 kg)  01/15/22 196 lb (88.9 kg)  01/05/22 194 lb 9.6 oz (88.3 kg)    Diabetic Foot Exam - Simple   No data filed    Lab Results  Component Value Date   WBC 8.7 10/30/2021   HGB 14.4 10/30/2021   HCT 42.6 10/30/2021   PLT 395.0 10/30/2021   GLUCOSE 95 10/30/2021   CHOL 171 10/30/2021   TRIG 119.0 10/30/2021   HDL 52.60 10/30/2021   LDLDIRECT 167.0 07/24/2019   LDLCALC 94 10/30/2021   ALT 24 10/30/2021   AST 16 10/30/2021   NA 134 (L) 10/30/2021   K 4.2 10/30/2021   CL 103 10/30/2021   CREATININE 0.57 10/30/2021   BUN 9 10/30/2021   CO2 26 10/30/2021   TSH 0.21 (L) 10/30/2021   HGBA1C 5.1 10/30/2021    Lab Results  Component Value Date   TSH 0.21 (L) 10/30/2021   Lab Results  Component Value Date   WBC 8.7 10/30/2021   HGB 14.4 10/30/2021   HCT 42.6 10/30/2021   MCV 91.6 10/30/2021   PLT 395.0 10/30/2021   Lab Results  Component Value Date   NA 134 (L) 10/30/2021   K 4.2 10/30/2021   CO2 26 10/30/2021   GLUCOSE 95 10/30/2021   BUN 9 10/30/2021   CREATININE 0.57 10/30/2021   BILITOT 0.5 10/30/2021   ALKPHOS 56 10/30/2021   AST 16 10/30/2021   ALT 24 10/30/2021   PROT 6.9 10/30/2021   ALBUMIN 4.1 10/30/2021   CALCIUM 9.9 10/30/2021   ANIONGAP 10 11/25/2019   GFR 105.67 10/30/2021   Lab Results  Component Value Date   CHOL 171 10/30/2021   Lab Results  Component Value Date   HDL 52.60 10/30/2021   Lab Results  Component Value Date   LDLCALC 94 10/30/2021   Lab Results  Component Value Date   TRIG 119.0 10/30/2021   Lab Results  Component Value Date   CHOLHDL 3 10/30/2021   Lab Results  Component Value Date   HGBA1C 5.1 10/30/2021   Colonoscopy: Last completed on 12/21/2018.  - Hemorrhoids found on perianal exam. - Three 2 to 6 mm polyps in the descending colon, in the ascending colon and in the cecum, removed with a cold snare. Resected and retrieved. - Non-bleeding internal hemorrhoids. - The examined  portion of the ileum was normal.  She has a colonoscopy scheduled for 02/22/2022. Pap Smear: Due Mammogram: Last completed on 09/21/2021. No mammographic evidence of malignancy. Repeat in 2024.    Assessment & Plan:   Problem List Items Addressed This Visit       Other   Vitamin D deficiency   Preventative health care - Primary   No orders of the defined types were placed in this encounter.  I, Kellie Simmering, personally preformed the services described in this documentation.  All medical record entries made by the scribe were at my direction and in my presence.  I have reviewed the chart and discharge instructions (if applicable) and agree that the record reflects my personal performance and is accurate and complete. 02/09/2022.  I,Mohammed Iqbal,acting as a scribe for Penni Homans, MD.,have documented all relevant documentation on the behalf of Penni Homans, MD,as directed by  Penni Homans, MD while in the presence of Penni Homans, MD.  Kellie Simmering

## 2022-02-09 NOTE — Assessment & Plan Note (Signed)
Tolerating statin, encouraged heart healthy diet, avoid trans fats, minimize simple carbs and saturated fats. Increase exercise as tolerated 

## 2022-02-09 NOTE — Assessment & Plan Note (Signed)
On Levothyroxine, continue to monitor 

## 2022-02-09 NOTE — Patient Instructions (Signed)
[place] do not drink  Preventive Care 51-51 Years Old, Female Preventive care refers to lifestyle choices and visits with your health care provider that can promote health and wellness. Preventive care visits are also called wellness exams. What can I expect for my preventive care visit? Counseling Your health care provider may ask you questions about your: Medical history, including: Past medical problems. Family medical history. Pregnancy history. Current health, including: Menstrual cycle. Method of birth control. Emotional well-being. Home life and relationship well-being. Sexual activity and sexual health. Lifestyle, including: Alcohol, nicotine or tobacco, and drug use. Access to firearms. Diet, exercise, and sleep habits. Work and work Statistician. Sunscreen use. Safety issues such as seatbelt and bike helmet use. Physical exam Your health care provider will check your: Height and weight. These may be used to calculate your BMI (body mass index). BMI is a measurement that tells if you are at a healthy weight. Waist circumference. This measures the distance around your waistline. This measurement also tells if you are at a healthy weight and may help predict your risk of certain diseases, such as type 2 diabetes and high blood pressure. Heart rate and blood pressure. Body temperature. Skin for abnormal spots. What immunizations do I need?  Vaccines are usually given at various ages, according to a schedule. Your health care provider will recommend vaccines for you based on your age, medical history, and lifestyle or other factors, such as travel or where you work. What tests do I need? Screening Your health care provider may recommend screening tests for certain conditions. This may include: Lipid and cholesterol levels. Diabetes screening. This is done by checking your blood sugar (glucose) after you have not eaten for a while (fasting). Pelvic exam and Pap  test. Hepatitis B test. Hepatitis C test. HIV (human immunodeficiency virus) test. STI (sexually transmitted infection) testing, if you are at risk. Lung cancer screening. Colorectal cancer screening. Mammogram. Talk with your health care provider about when you should start having regular mammograms. This may depend on whether you have a family history of breast cancer. BRCA-related cancer screening. This may be done if you have a family history of breast, ovarian, tubal, or peritoneal cancers. Bone density scan. This is done to screen for osteoporosis. Talk with your health care provider about your test results, treatment options, and if necessary, the need for more tests. Follow these instructions at home: Eating and drinking  Eat a diet that includes fresh fruits and vegetables, whole grains, lean protein, and low-fat dairy products. Take vitamin and mineral supplements as recommended by your health care provider. Do not drink alcohol if: Your health care provider tells you not to drink. You are pregnant, may be pregnant, or are planning to become pregnant. If you drink alcohol: Limit how much you have to 0-1 drink a day. Know how much alcohol is in your drink. In the [place] one drink equals one 12 oz bottle of beer (355 mL), one 5 oz glass of wine (148 mL), or one 1 oz glass of hard liquor (44 mL). Lifestyle Brush your teeth every morning and night with fluoride toothpaste. Floss one time each day. Exercise for at least 30 minutes 5 or more days each week. Do not use any products that contain nicotine or tobacco. These products include cigarettes, chewing tobacco, and vaping devices, such as e-cigarettes. If you need help quitting, ask your health care provider. Do not use drugs. If you are sexually active, practice safe sex. Use a  condom or other form of protection to prevent STIs. If you do not wish to become pregnant, use a form of birth control. If you plan to become pregnant,  see your health care provider for a prepregnancy visit. Take aspirin only as told by your health care provider. Make sure that you understand how much to take and what form to take. Work with your health care provider to find out whether it is safe and beneficial for you to take aspirin daily. Find healthy ways to manage stress, such as: Meditation, yoga, or listening to music. Journaling. Talking to a trusted person. Spending time with friends and family. Minimize exposure to UV radiation to reduce your risk of skin cancer. Safety Always wear your seat belt while driving or riding in a vehicle. Do not drive: If you have been drinking alcohol. Do not ride with someone who has been drinking. When you are tired or distracted. While texting. If you have been using any mind-altering substances or drugs. Wear a helmet and other protective equipment during sports activities. If you have firearms in your house, make sure you follow all gun safety procedures. Seek help if you have been physically or sexually abused. What's next? Visit your health care provider once a year for an annual wellness visit. Ask your health care provider how often you should have your eyes and teeth checked. Stay up to date on all vaccines. This information is not intended to replace advice given to you by your health care provider. Make sure you discuss any questions you have with your health care provider. Document Revised: 12/31/2020 Document Reviewed: 12/31/2020 Elsevier Patient Education  [place]. do not drink

## 2022-02-09 NOTE — Assessment & Plan Note (Signed)
Well controlled, no changes to meds. Encouraged heart healthy diet such as the DASH diet and exercise as tolerated.  °

## 2022-02-10 NOTE — Assessment & Plan Note (Signed)
Is proceeding with colonoscopy next month

## 2022-02-19 ENCOUNTER — Other Ambulatory Visit: Payer: Self-pay

## 2022-02-22 ENCOUNTER — Encounter: Payer: Self-pay | Admitting: Gastroenterology

## 2022-02-22 ENCOUNTER — Ambulatory Visit (AMBULATORY_SURGERY_CENTER): Payer: No Typology Code available for payment source | Admitting: Gastroenterology

## 2022-02-22 VITALS — BP 115/79 | HR 71 | Temp 98.9°F | Resp 12 | Ht 63.0 in | Wt 196.0 lb

## 2022-02-22 DIAGNOSIS — K6289 Other specified diseases of anus and rectum: Secondary | ICD-10-CM

## 2022-02-22 DIAGNOSIS — Z86010 Personal history of colonic polyps: Secondary | ICD-10-CM | POA: Diagnosis present

## 2022-02-22 DIAGNOSIS — D124 Benign neoplasm of descending colon: Secondary | ICD-10-CM | POA: Diagnosis not present

## 2022-02-22 DIAGNOSIS — D120 Benign neoplasm of cecum: Secondary | ICD-10-CM

## 2022-02-22 DIAGNOSIS — Z09 Encounter for follow-up examination after completed treatment for conditions other than malignant neoplasm: Secondary | ICD-10-CM

## 2022-02-22 MED ORDER — SODIUM CHLORIDE 0.9 % IV SOLN: 500.0000 mL | Freq: Once | INTRAVENOUS | Status: DC

## 2022-02-22 NOTE — Progress Notes (Signed)
Pt's states no medical or surgical changes since previsit or office visit. 

## 2022-02-22 NOTE — Patient Instructions (Signed)
Handout on polyps given to you today    YOU HAD AN ENDOSCOPIC PROCEDURE TODAY AT [facility]:   Refer to the procedure report that was given to you for any specific questions about what was found during the examination.  If the procedure report does not answer your questions, please call your gastroenterologist to clarify.  If you requested that your care partner not be given the details of your procedure findings, then the procedure report has been included in a sealed envelope for you to review at your convenience later.  YOU SHOULD EXPECT: Some feelings of bloating in the abdomen. Passage of more gas than usual.  Walking can help get rid of the air that was put into your GI tract during the procedure and reduce the bloating. If you had a lower endoscopy (such as a colonoscopy or flexible sigmoidoscopy) you may notice spotting of blood in your stool or on the toilet paper. If you underwent a bowel prep for your procedure, you may not have a normal bowel movement for a few days.  Please Note:  You might notice some irritation and congestion in your nose or some drainage.  This is from the oxygen used during your procedure.  There is no need for concern and it should clear up in a day or so.  SYMPTOMS TO REPORT IMMEDIATELY:  Following lower endoscopy (colonoscopy or flexible sigmoidoscopy):  Excessive amounts of blood in the stool  Significant tenderness or worsening of abdominal pains  Swelling of the abdomen that is new, acute  Fever of 100F or higher  For urgent or emergent issues, a gastroenterologist can be reached at any hour by calling [phone]. Do not use MyChart messaging for urgent concerns.    DIET:  We do recommend a small meal at first, but then you may proceed to your regular diet.  Drink plenty of fluids but you should avoid alcoholic beverages for 24 hours.  ACTIVITY:  You should plan to take it easy for the rest of today and you should NOT DRIVE or use  heavy machinery until tomorrow (because of the sedation medicines used during the test).    FOLLOW UP: Our staff will call the number listed on your records the next business day following your procedure.  We will call around 7:15- 8:00 am to check on you and address any questions or concerns that you may have regarding the information given to you following your procedure. If we do not reach you, we will leave a message.  If you develop any symptoms (ie: fever, flu-like symptoms, shortness of breath, cough etc.) before then, please call [phone].  If you test positive for Covid 19 in the 2 weeks post procedure, please call and report this information to [place].    If any biopsies were taken you will be contacted by phone or by letter within the next 1-3 weeks.  Please call us at [phone] if you have not heard about the biopsies in 3 weeks.    SIGNATURES/CONFIDENTIALITY: You and/or your care partner have signed paperwork which will be entered into your electronic medical record.  These signatures attest to the fact that that the information above on your After Visit Summary has been reviewed and is understood.  Full responsibility of the confidentiality of this discharge information lies with you and/or your care-partner.

## 2022-02-22 NOTE — Progress Notes (Signed)
Report given to PACU, vss 

## 2022-02-22 NOTE — Op Note (Signed)
[place] Patient Name: Katie Henry Procedure Date: 02/22/2022 9:07 AM MRN: [id number] Endoscopist: Gerrit Heck , MD Age: 51 Referring MD:  Date of Birth: 11-Nov-1970 Gender: Female Account #: [id number] Procedure:                Colonoscopy Indications:              High risk colon cancer surveillance: Personal                            history of sessile serrated colon polyp (less than                            10 mm in size) with no dysplasia                           Initial colonoscopy was 12/2018 and notable for 3                            subcentimeter sessile serrated polyps. Medicines:                Monitored Anesthesia Care Procedure:                Pre-Anesthesia Assessment:                           - Prior to the procedure, a History and Physical                            was performed, and patient medications and                            allergies were reviewed. The patient's tolerance of                            previous anesthesia was also reviewed. The risks                            and benefits of the procedure and the sedation                            options and risks were discussed with the patient.                            All questions were answered, and informed consent                            was obtained. Prior Anticoagulants: The patient has                            taken no previous anticoagulant or antiplatelet                            agents. ASA Grade Assessment: II - A patient with  mild systemic disease. After reviewing the risks                            and benefits, the patient was deemed in                            satisfactory condition to undergo the procedure.                           After obtaining informed consent, the colonoscope                            was passed under direct vision. Throughout the                            procedure, the patient's blood pressure, pulse,  and                            oxygen saturations were monitored continuously. The                            CF [id number] #[id number] was introduced through the anus                            and advanced to the the terminal ileum. The                            colonoscopy was performed without difficulty. The                            patient tolerated the procedure well. The quality                            of the bowel preparation was good. The terminal                            ileum, ileocecal valve, appendiceal orifice, and                            rectum were photographed. Scope In: 9:12:07 AM Scope Out: 9:25:47 AM Scope Withdrawal Time: 0 hours 11 minutes 53 seconds  Total Procedure Duration: 0 hours 13 minutes 40 seconds  Findings:                 The perianal and digital rectal examinations were                            normal.                           Two sessile polyps were found in the descending                            colon and cecum. The polyps were 2 to 3 mm in size.  These polyps were removed with a cold snare.                            Resection and retrieval were complete. Estimated                            blood loss was minimal.                           The exam was otherwise normal throughout the                            remainder of the colon.                           Anal papilla(e) were hypertrophied.                           The terminal ileum appeared normal. Complications:            No immediate complications. Estimated Blood Loss:     Estimated blood loss was minimal. Impression:               - Two 2 to 3 mm polyps in the descending colon and                            in the cecum, removed with a cold snare. Resected                            and retrieved.                           - Anal papilla(e) were hypertrophied.                           - The examined portion of the ileum was normal. Recommendation:            - Patient has a contact number available for                            emergencies. The signs and symptoms of potential                            delayed complications were discussed with the                            patient. Return to normal activities tomorrow.                            Written discharge instructions were provided to the                            patient.                           - Resume previous diet.                           -  Continue present medications.                           - Await pathology results.                           - Repeat colonoscopy in 5 years for surveillance                            due to history of multiple SSPs.                           - Return to GI office PRN. Gerrit Heck, MD 02/22/2022 9:30:09 AM

## 2022-02-22 NOTE — Progress Notes (Signed)
GASTROENTEROLOGY PROCEDURE H&P NOTE   Primary Care Physician: [name], MD    Reason for Procedure:  Colon Cancer screening, colon polyp surveillance  Plan:    Colonoscopy  Patient is appropriate for endoscopic procedure(s) in the ambulatory ([facility]) setting.  The nature of the procedure, as well as the risks, benefits, and alternatives were carefully and thoroughly reviewed with the patient. Ample time for discussion and questions allowed. The patient understood, was satisfied, and agreed to proceed.     HPI: Katie Henry is a 51 y.o. female who presents for colonoscopy for Colon Cancer screening and continued colon polyp surveillance.  No active GI symptoms.   Initial colonoscopy was 12/2018 and notable for 3 subcentimeter sessile serrated polyps, with recommendation to repeat in 3 years.  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   IBS (irritable bowel syndrome)    Migraine    Syncope    Vitamin D deficiency     Past Surgical History:  Procedure Laterality Date    treatment  [place]  2020   ssp   CYSTOSCOPY/URETEROSCOPY/HOLMIUM LASER/STENT PLACEMENT Left 08/09/2017   Procedure: CYSTOSCOPY/RETROGRADE/URETEROSCOPY/HOLMIUM LASER/STENT PLACEMENT;  Surgeon: Festus Aloe, MD;  Location: [facility];  Service: Urology;  Laterality: Left;  ONLY NEEDS 60 MIN   ENDOMETRIAL ABLATION  01/2007   EXTRACORPOREAL SHOCK WAVE LITHOTRIPSY Left 07/21/2017   Procedure: LEFT EXTRACORPOREAL SHOCK WAVE LITHOTRIPSY (ESWL);  Surgeon: Bjorn Loser, MD;  Location: [facility];  Service:  Urology;  Laterality: Left;   VAGINAL HYSTERECTOMY  2010   ovaries still in place    Prior to Admission medications   Medication Sig Start Date End Date Taking? Authorizing Provider  ALPRAZolam (XANAX) 0.25 MG tablet Take 1 tablet (0.25 mg total) by mouth 2 (two) times daily as needed for anxiety. 08/24/21  Yes [name], MD  amLODipine (NORVASC) 5 MG tablet Take 1 tablet (5 mg total) by mouth daily. 09/08/21  Yes Marrian Salvage, FNP  atorvastatin (LIPITOR) 10 MG tablet Take 1 tablet (10 mg total) by mouth daily. 10/20/21  Yes Marrian Salvage, FNP  fluticasone [facility]) 50 MCG/ACT nasal spray Place 2 sprays into both nostrils daily. 01/05/22  Yes Terrilyn Saver, NP  hydrochlorothiazide (MICROZIDE) 12.5 MG capsule Take 1 capsule (12.5 mg total) by mouth daily as needed. For BP>145/90 10/20/21  Yes Marrian Salvage, FNP  SYNTHROID 175 MCG tablet Take 1 tablet (175 mcg total) by mouth daily before breakfast. 12/21/21  Yes [name], MD  Vitamin D, Ergocalciferol, (DRISDOL) 1.25 MG (50000 UT) CAPS capsule Take 1 capsule (50,000 Units total) by mouth every 7 (seven) days. 07/25/19  Yes [name], MD  acetaminophen (TYLENOL) 500 MG tablet Take 1,000 mg by mouth every 6 (six) hours as needed for mild pain or moderate pain.    [provider]  cetirizine (ZYRTEC) 10 MG tablet Take 1 tablet (10 mg total) by mouth daily. Patient taking differently: Take 10  mg by mouth daily as needed. 01/05/22   Terrilyn Saver, NP  cholecalciferol (VITAMIN D3) 25 MCG (1000 UT) tablet Take 5,000 Units by mouth daily. Patient not taking: Reported on 02/22/2022    [provider]  meclizine (ANTIVERT) 25 MG tablet Take 1 tablet (25 mg total) by mouth 3 (three) times daily as needed for dizziness. 01/15/19   Isla Pence, MD  Multiple Vitamins-Minerals (MULTIVITAMIN WITH MINERALS) tablet Take 1 tablet by mouth daily.    [provider]  valACYclovir (VALTREX) 1000 MG tablet  Take 1 tablet (1,000 mg total) by mouth 2 (two) times daily. Patient taking differently: Take 1,000 mg by mouth 2 (two) times daily as needed. 08/10/19   [name], MD    Current Outpatient Medications  Medication Sig Dispense Refill   ALPRAZolam (XANAX) 0.25 MG tablet Take 1 tablet (0.25 mg total) by mouth 2 (two) times daily as needed for anxiety. 30 tablet 1   amLODipine (NORVASC) 5 MG tablet Take 1 tablet (5 mg total) by mouth daily. 90 tablet 1   atorvastatin (LIPITOR) 10 MG tablet Take 1 tablet (10 mg total) by mouth daily. 90 tablet 3   fluticasone (FLONASE) 50 MCG/ACT nasal spray Place 2 sprays into both nostrils daily. 16 g 6   hydrochlorothiazide (MICROZIDE) 12.5 MG capsule Take 1 capsule (12.5 mg total) by mouth daily as needed. For BP>145/90 90 capsule 1   SYNTHROID 175 MCG tablet Take 1 tablet (175 mcg total) by mouth daily before breakfast. 30 tablet 3   Vitamin D, Ergocalciferol, (DRISDOL) 1.25 MG (50000 UT) CAPS capsule Take 1 capsule (50,000 Units total) by mouth every 7 (seven) days. 4 capsule 4   acetaminophen (TYLENOL) 500 MG tablet Take 1,000 mg by mouth every 6 (six) hours as needed for mild pain or moderate pain.     cetirizine (ZYRTEC) 10 MG tablet Take 1 tablet (10 mg total) by mouth daily. (Patient taking differently: Take 10 mg by mouth daily as needed.) 100 tablet 0   cholecalciferol (VITAMIN D3) 25 MCG (1000 UT) tablet Take 5,000 Units by mouth daily. (Patient not taking: Reported on 02/22/2022)     meclizine (ANTIVERT) 25 MG tablet Take 1 tablet (25 mg total) by mouth 3 (three) times daily as needed for dizziness. 30 tablet 0   Multiple Vitamins-Minerals (MULTIVITAMIN WITH MINERALS) tablet Take 1 tablet by mouth daily.     valACYclovir (VALTREX) 1000 MG tablet Take 1 tablet (1,000 mg total) by mouth 2 (two) times daily. (Patient taking differently: Take 1,000 mg by mouth 2 (two) times daily as needed.) 60 tablet 0   Current Facility-Administered Medications   Medication Dose Route Frequency Provider Last Rate Last Admin   0.9 %  sodium chloride infusion  500 mL Intravenous Once Adrieanna Boteler V, DO        Allergies as of 02/22/2022 - Review Complete 02/22/2022  Allergen Reaction Noted   Zofran [ondansetron hcl] Other (See Comments) 07/21/2017   Amoxicillin Nausea And Vomiting and Other (See Comments) 05/17/2011   Codeine Nausea And Vomiting 05/17/2011   Imitrex [sumatriptan] Nausea And Vomiting 11/15/2016   Ultram [tramadol hcl] Nausea And Vomiting 05/17/2011    Family History  Problem Relation Age of Onset   Hyperlipidemia Mother    Heart disease Mother        bicuspid mitral valve   Hypertension Father    Dementia Father    Seizures Father        s/p TBI  Graves' disease Sister    Obesity Brother    Heart attack Maternal Aunt        deceased age 29   Heart disease Maternal Aunt    Stomach cancer Paternal Uncle    Hyperlipidemia Maternal Grandmother    Diabetes Maternal Grandmother    Kidney disease Maternal Grandmother    Hyperlipidemia Maternal Grandfather    Heart disease Maternal Grandfather 74       MI   Prostate cancer Maternal Grandfather    Hypertension Paternal Grandfather    Prostate cancer Other        maternal and paternal grandparents   Colon cancer Neg Hx    Esophageal cancer Neg Hx    Colon polyps Neg Hx    Rectal cancer Neg Hx     Social History   Socioeconomic History   Marital status: Married    Spouse name: Alanya Vukelich   Number of children: 2   Years of education: 13   Highest education level: Not on file  Occupational History    Employer: [employer]  Tobacco Use   Smoking status: Former    Packs/day: 0.25    Years: 6.00    Total pack years: 1.50    Types: Cigarettes   Smokeless tobacco: Never   Tobacco comments:    quit 2010  Vaping Use   Vaping Use: Never used  Substance and Sexual Activity   Alcohol use: Not Currently    Comment: Ocassionally   Drug use: Never   Sexual  activity: Yes    Partners: Male    Birth control/protection: Post-menopausal    Comment: work at [employer], lives with fiance and son  Other Topics Concern   Not on file  Social History Narrative   Engaged, two sons.   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   No Tob, occ alcohol, no drugs.   Social Determinants of Health   Financial Resource Strain: Not on file  Food Insecurity: Not on file  Transportation Needs: Not on file  Physical Activity: Not on file  Stress: Not on file  Social Connections: Not on file  Intimate Partner Violence: Not on file    Physical Exam: Vital signs in last 24 hours: '@BP'$  118/88   Pulse 81   Temp 98.9 F (37.2 C)   Ht '5\' 3"'$  (1.6 m)   Wt 196 lb (88.9 kg)   SpO2 97%   BMI 34.72 kg/m  GEN: NAD EYE: Sclerae anicteric ENT: MMM CV: Non-tachycardic Pulm: CTA b/l GI: Soft, NT/ND NEURO:  Alert & Oriented x 3   Gerrit Heck, DO [facility]   02/22/2022 9:04 AM

## 2022-02-22 NOTE — Progress Notes (Signed)
Called to room to assist during endoscopic procedure.  Patient ID and intended procedure confirmed with present staff. Received instructions for my participation in the procedure from the performing physician.  

## 2022-02-23 ENCOUNTER — Telehealth: Payer: Self-pay

## 2022-02-23 NOTE — Telephone Encounter (Signed)
  Follow up Call-     02/22/2022    8:30 AM  Call back number  Post procedure Call Back phone  # [phone]  Permission to leave phone message Yes     Patient questions:  Do you have a fever, pain , or abdominal swelling? No. Pain Score  0 *  Have you tolerated food without any problems? Yes.    Have you been able to return to your normal activities? Yes.    Do you have any questions about your discharge instructions: Diet   No. Medications  No. Follow up visit  No.  Do you have questions or concerns about your Care? No.  Actions: * If pain score is 4 or above: No action needed, pain <4.

## 2022-02-24 ENCOUNTER — Encounter (INDEPENDENT_AMBULATORY_CARE_PROVIDER_SITE_OTHER): Payer: Self-pay

## 2022-03-29 ENCOUNTER — Other Ambulatory Visit: Payer: Self-pay

## 2022-03-30 ENCOUNTER — Other Ambulatory Visit: Payer: Self-pay

## 2022-04-26 ENCOUNTER — Other Ambulatory Visit: Payer: Self-pay

## 2022-04-27 ENCOUNTER — Other Ambulatory Visit: Payer: Self-pay

## 2022-05-08 ENCOUNTER — Ambulatory Visit (HOSPITAL_COMMUNITY)
Admission: EM | Admit: 2022-05-08 | Discharge: 2022-05-08 | Disposition: A | Discharge status: Home / Self Care | Payer: No Typology Code available for payment source | Attending: Physician Assistant | Admitting: Physician Assistant

## 2022-05-08 ENCOUNTER — Encounter (HOSPITAL_COMMUNITY): Payer: Self-pay | Admitting: Physician Assistant

## 2022-05-08 DIAGNOSIS — H8110 Benign paroxysmal vertigo, unspecified ear: Secondary | ICD-10-CM | POA: Diagnosis not present

## 2022-05-08 DIAGNOSIS — R110 Nausea: Secondary | ICD-10-CM

## 2022-05-08 MED ORDER — PROMETHAZINE HCL 12.5 MG PO TABS: 12.5000 mg | ORAL_TABLET | Freq: Three times a day (TID) | ORAL | 0 refills | Status: AC | PRN

## 2022-05-08 MED ORDER — MECLIZINE HCL 50 MG PO TABS: 50.0000 mg | ORAL_TABLET | Freq: Two times a day (BID) | ORAL | 0 refills | Status: DC | PRN

## 2022-05-08 MED ORDER — MECLIZINE HCL 50 MG PO TABS: 50.0000 mg | ORAL_TABLET | Freq: Two times a day (BID) | ORAL | 0 refills | Status: AC | PRN

## 2022-05-08 MED ORDER — PROMETHAZINE HCL 12.5 MG PO TABS: 12.5000 mg | ORAL_TABLET | Freq: Three times a day (TID) | ORAL | 0 refills | Status: DC | PRN

## 2022-05-08 NOTE — Discharge Instructions (Addendum)
Take meclizine up to twice a day.  Look at performing Epley maneuver at home if you can tolerate this (the instructions are in this paperwork).  Use Phenergan for nausea.  Make sure you rest and drink plenty of fluid.  If your symptoms or not improving you may benefit from vestibular physical therapy and I recommend he follow-up closely with your primary care.  If anything changes or worsens including nausea/vomiting interfere with oral intake, weakness, worsening dizziness, difficulty speaking you need to go to the emergency room immediately.

## 2022-05-08 NOTE — ED Provider Notes (Signed)
[place]    CSN: [id number] Arrival date & time: 05/08/22  1610      History   Chief Complaint Chief Complaint  Patient presents with   Dizziness    HPI Katie Henry is a 51 y.o. female.   Patient presents today companied by her husband who provide the majority of history.  Reports a 1 day history of dizziness.  She describes this as a room spinning sensation that is worse when she changes positions or moves her head.  She reports associated nausea but denies any vomiting.  Denies any recent head injury or medication changes.  She does have a history of migraines and believes that she might be developing a migraine though she denies any current headache.  She denies any head injury, loss of consciousness, recent illness or additional symptoms such as cough/congestion.  Denies any chest pain, shortness of breath, palpitations.  She does have a history of vertigo and states current symptoms are similar to previous episodes of this condition.  She has not tried any over-the-counter medication for symptom management.  She is having difficult to deal activities as result of symptoms.    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   IBS (irritable bowel syndrome)    Migraine    Syncope    Vitamin D deficiency     Patient Active Problem List   Diagnosis Date Noted   Colon polyp 02/04/2021   Decreased visual acuity 02/04/2021   Acute pain of right knee 09/26/2019   Low back pain 05/20/2019   Essential hypertension 05/15/2019   Class 1 obesity with serious comorbidity and body mass index (BMI) of 33.0 to  33.9 in adult 05/15/2019   Tinea corporis 11/27/2018   Rectal bleeding 11/27/2018   Hyperlipidemia 12/13/2017   Plantar fasciitis 01/11/2017   Tailor's bunion 01/11/2017   Grief reaction 11/15/2016   Hyperglycemia 11/15/2016   Preventative health care 10/14/2013   Graves disease    Endometriosis    IBS (irritable bowel syndrome)    Syncope    Heel pain 07/17/2012   Migraines 01/13/2012   Kidney stones 01/13/2012   Breast cancer screening 07/08/2011   Vitamin D deficiency 05/23/2011   Hypothyroidism 05/23/2011   Anxiety and depression 05/23/2011    Past Surgical History:  Procedure Laterality Date    treatment  [place]  2020   ssp   CYSTOSCOPY/URETEROSCOPY/HOLMIUM LASER/STENT PLACEMENT Left 08/09/2017   Procedure: CYSTOSCOPY/RETROGRADE/URETEROSCOPY/HOLMIUM LASER/STENT PLACEMENT;  Surgeon: Festus Aloe, MD;  Location: [facility];  Service: Urology;  Laterality: Left;  ONLY NEEDS 60 MIN   ENDOMETRIAL ABLATION  01/2007   EXTRACORPOREAL SHOCK WAVE LITHOTRIPSY Left 07/21/2017   Procedure: LEFT EXTRACORPOREAL SHOCK WAVE LITHOTRIPSY (ESWL);  Surgeon: Bjorn Loser, MD;  Location: [facility];  Service: Urology;  Laterality: Left;   VAGINAL HYSTERECTOMY  2010   ovaries still in place    OB History     Gravida  2   Para  2  Term      Preterm      AB      Living  2      SAB      IAB      Ectopic      Multiple      Live Births               Home Medications    Prior to Admission medications   Medication Sig Start Date End Date Taking? Authorizing Provider  meclizine (ANTIVERT) 50 MG tablet Take 1 tablet (50 mg total) by mouth 2 (two) times daily as needed for dizziness or nausea. 05/08/22  Yes Dawnna Gritz, Derry Skill, PA-C  promethazine (PHENERGAN) 12.5 MG tablet Take 1 tablet (12.5 mg total) by mouth every 8 (eight) hours as needed for nausea or vomiting. 05/08/22  Yes Jerek Meulemans, Derry Skill, PA-C  acetaminophen (TYLENOL) 500 MG tablet  Take 1,000 mg by mouth every 6 (six) hours as needed for mild pain or moderate pain.    [provider]  ALPRAZolam Duanne Moron) 0.25 MG tablet Take 1 tablet (0.25 mg total) by mouth 2 (two) times daily as needed for anxiety. 08/24/21   [name], MD  amLODipine (NORVASC) 5 MG tablet Take 1 tablet (5 mg total) by mouth daily. 09/08/21   Marrian Salvage, FNP  atorvastatin (LIPITOR) 10 MG tablet Take 1 tablet (10 mg total) by mouth daily. 10/20/21   Marrian Salvage, FNP  cetirizine (ZYRTEC) 10 MG tablet Take 1 tablet (10 mg total) by mouth daily. Patient taking differently: Take 10 mg by mouth daily as needed. 01/05/22   Terrilyn Saver, NP  cholecalciferol (VITAMIN D3) 25 MCG (1000 UT) tablet Take 5,000 Units by mouth daily. Patient not taking: Reported on 02/22/2022    [provider]  fluticasone (FLONASE) 50 MCG/ACT nasal spray Place 2 sprays into both nostrils daily. 01/05/22   Terrilyn Saver, NP  hydrochlorothiazide (MICROZIDE) 12.5 MG capsule Take 1 capsule (12.5 mg total) by mouth daily as needed. For BP>145/90 10/20/21   Marrian Salvage, FNP  Multiple Vitamins-Minerals (MULTIVITAMIN WITH MINERALS) tablet Take 1 tablet by mouth daily.    [provider]  SYNTHROID 175 MCG tablet Take 1 tablet (175 mcg total) by mouth daily before breakfast. 12/21/21   [name], MD  valACYclovir (VALTREX) 1000 MG tablet Take 1 tablet (1,000 mg total) by mouth 2 (two) times daily. Patient taking differently: Take 1,000 mg by mouth 2 (two) times daily as needed. 08/10/19   [name], MD  Vitamin D, Ergocalciferol, (DRISDOL) 1.25 MG (50000 UT) CAPS capsule Take 1 capsule (50,000 Units total) by mouth every 7 (seven) days. 07/25/19   [name], MD    Family History Family History  Problem Relation Age of Onset   Hyperlipidemia Mother    Heart disease Mother        bicuspid mitral valve   Hypertension Father    Dementia Father    Seizures Father        s/p  TBI   Graves' disease Sister    Obesity Brother    Heart attack Maternal Aunt        deceased age 61   Heart disease Maternal Aunt    Stomach cancer Paternal Uncle    Hyperlipidemia Maternal Grandmother    Diabetes Maternal Grandmother    Kidney disease Maternal Grandmother    Hyperlipidemia Maternal Grandfather    Heart disease Maternal Grandfather 47  MI   Prostate cancer Maternal Grandfather    Hypertension Paternal Grandfather    Prostate cancer Other        maternal and paternal grandparents   Colon cancer Neg Hx    Esophageal cancer Neg Hx    Colon polyps Neg Hx    Rectal cancer Neg Hx     Social History Social History   Tobacco Use   Smoking status: Former    Packs/day: 0.25    Years: 6.00    Total pack years: 1.50    Types: Cigarettes   Smokeless tobacco: Never   Tobacco comments:    quit 2010  Vaping Use   Vaping Use: Never used  Substance Use Topics   Alcohol use: Not Currently    Comment: Ocassionally   Drug use: Never     Allergies   Zofran [ondansetron hcl], Amoxicillin, Codeine, Imitrex [sumatriptan], and Ultram [tramadol hcl]   Review of Systems Review of Systems  Constitutional:  Positive for activity change. Negative for appetite change, fatigue and fever.  HENT:  Negative for congestion.   Eyes:  Negative for photophobia and visual disturbance.  Respiratory:  Negative for cough and shortness of breath.   Cardiovascular:  Negative for chest pain and palpitations.  Gastrointestinal:  Positive for nausea. Negative for abdominal pain, diarrhea and vomiting.  Neurological:  Positive for dizziness. Negative for syncope, facial asymmetry, speech difficulty, weakness, light-headedness, numbness and headaches.     Physical Exam Triage Vital Signs ED Triage Vitals [05/08/22 1619]  Enc Vitals Group     BP (!) 152/77     Pulse Rate 78     Resp 12     Temp (!) 97.4 F (36.3 C)     Temp Source Oral     SpO2 100 %     Weight      Height       Head Circumference      Peak Flow      Pain Score      Pain Loc      Pain Edu?      Excl. in [place]?    No data found.  Updated Vital Signs BP (!) 152/77 (BP Location: Left Arm)   Pulse 78   Temp (!) 97.4 F (36.3 C) (Oral)   Resp 12   SpO2 100%   Visual Acuity Right Eye Distance:   Left Eye Distance:   Bilateral Distance:    Right Eye Near:   Left Eye Near:    Bilateral Near:     Physical Exam Vitals reviewed.  Constitutional:      General: She is awake. She is not in acute distress.    Appearance: Normal appearance. She is well-developed. She is not ill-appearing.     Comments: Very pleasant female appears stated age in no acute distress sitting comfortably in wheelchair  HENT:     Head: Normocephalic and atraumatic. No raccoon eyes, Battle's sign or contusion.     Right Ear: Tympanic membrane, ear canal and external ear normal. No hemotympanum.     Left Ear: Tympanic membrane, ear canal and external ear normal. No hemotympanum.     Nose: Nose normal.     Mouth/Throat:     Tongue: Tongue does not deviate from midline.     Pharynx: Uvula midline. No oropharyngeal exudate or posterior oropharyngeal erythema.  Eyes:     Extraocular Movements: Extraocular movements intact.     Right eye: Nystagmus present.     Left  eye: No nystagmus.     Conjunctiva/sclera: Conjunctivae normal.     Pupils: Pupils are equal, round, and reactive to light.     Comments: Right horizontal nystagmus  Cardiovascular:     Rate and Rhythm: Normal rate and regular rhythm.     Heart sounds: Normal heart sounds, S1 normal and S2 normal. No murmur heard. Pulmonary:     Effort: Pulmonary effort is normal.     Breath sounds: Normal breath sounds. No wheezing, rhonchi or rales.     Comments: Clear to auscultation bilaterally Abdominal:     Palpations: Abdomen is soft.     Tenderness: There is no abdominal tenderness.  Musculoskeletal:     Cervical back: No spinous process tenderness or  muscular tenderness.     Comments: Strength 5/5 bilateral upper and lower extremities  Lymphadenopathy:     Head:     Right side of head: No submental, submandibular or tonsillar adenopathy.     Left side of head: No submental, submandibular or tonsillar adenopathy.  Neurological:     General: No focal deficit present.     Mental Status: She is alert and oriented to person, place, and time.     Cranial Nerves: Cranial nerves 2-12 are intact.     Motor: Motor function is intact.     Coordination: Coordination is intact.     Gait: Gait is intact.     Comments: Cranial nerves II through XII grossly intact.  No focal neurological defect on exam.  Psychiatric:        Behavior: Behavior is cooperative.      UC Treatments / Results  Labs (all labs ordered are listed, but only abnormal results are displayed) Labs Reviewed - No data to display  EKG   Radiology No results found.  Procedures Procedures (including critical care time)  Medications Ordered in UC Medications - No data to display  Initial Impression / Assessment and Plan / UC Course  I have reviewed the triage vital signs and the nursing notes.  Pertinent labs & imaging results that were available during my care of the patient were reviewed by me and considered in my medical decision making (see chart for details).     Symptoms consistent with vertigo.  Patient was started on meclizine 50 mg up to twice a day.  She reports ongoing nausea but is unable to take Zofran so was given a prescription for promethazine to be taken up to 3 times a day.  She is to eat a bland diet and drink plenty fluid.  She was able to pass oral challenge by drinking ginger ale and eating goldfish in clinic today.  Discussed that she may benefit from vestibular physical therapy and was given information on Epley maneuver as part of her after visit summary.  If her symptoms or not improving she should follow-up with ENT.  Recommended close  follow-up with her primary care and she is to see them next week if symptoms have not resolved.  Discussed that if she has any worsening symptoms including headache, nausea/vomiting interfere with oral intake, weakness, persistent or change in character of dizziness, dysarthria she needs to go to the emergency room.  Strict return precautions given.  Work excuse note provided.  Final Clinical Impressions(s) / UC Diagnoses   Final diagnoses:  BPPV (benign paroxysmal positional vertigo), unspecified laterality  Nausea without vomiting     Discharge Instructions      Take meclizine up to twice a day.  Look at performing Epley maneuver at home if you can tolerate this (the instructions are in this paperwork).  Use Phenergan for nausea.  Make sure you rest and drink plenty of fluid.  If your symptoms or not improving you may benefit from vestibular physical therapy and I recommend he follow-up closely with your primary care.  If anything changes or worsens including nausea/vomiting interfere with oral intake, weakness, worsening dizziness, difficulty speaking you need to go to the emergency room immediately.     ED Prescriptions     Medication Sig Dispense Auth. Provider   meclizine (ANTIVERT) 50 MG tablet Take 1 tablet (50 mg total) by mouth 2 (two) times daily as needed for dizziness or nausea. 30 tablet Makinzi Prieur K, PA-C   promethazine (PHENERGAN) 12.5 MG tablet Take 1 tablet (12.5 mg total) by mouth every 8 (eight) hours as needed for nausea or vomiting. 15 tablet Jamy Whyte, Derry Skill, PA-C      PDMP not reviewed this encounter.   Terrilee Croak, PA-C 05/08/22 1737

## 2022-05-08 NOTE — ED Triage Notes (Signed)
Pt has dizziness and feels light headed since today and nausea .

## 2022-05-13 ENCOUNTER — Other Ambulatory Visit (INDEPENDENT_AMBULATORY_CARE_PROVIDER_SITE_OTHER): Payer: No Typology Code available for payment source

## 2022-05-13 DIAGNOSIS — E038 Other specified hypothyroidism: Secondary | ICD-10-CM | POA: Diagnosis not present

## 2022-05-13 DIAGNOSIS — I10 Essential (primary) hypertension: Secondary | ICD-10-CM

## 2022-05-13 DIAGNOSIS — E559 Vitamin D deficiency, unspecified: Secondary | ICD-10-CM

## 2022-05-13 DIAGNOSIS — R739 Hyperglycemia, unspecified: Secondary | ICD-10-CM | POA: Diagnosis not present

## 2022-05-13 DIAGNOSIS — E785 Hyperlipidemia, unspecified: Secondary | ICD-10-CM

## 2022-05-13 LAB — COMPREHENSIVE METABOLIC PANEL
ALT: 17 U/L (ref 0–35)
AST: 13 U/L (ref 0–37)
Albumin: 3.9 g/dL (ref 3.5–5.2)
Alkaline Phosphatase: 60 U/L (ref 39–117)
BUN: 9 mg/dL (ref 6–23)
CO2: 28 mEq/L (ref 19–32)
Calcium: 9.5 mg/dL (ref 8.4–10.5)
Chloride: 103 mEq/L (ref 96–112)
Creatinine, Ser: 0.6 mg/dL (ref 0.40–1.20)
GFR: 103.98 mL/min (ref >60.00)
Glucose, Bld: 93 mg/dL (ref 70–99)
Potassium: 3.8 mEq/L (ref 3.5–5.1)
Sodium: 136 mEq/L (ref 135–145)
Total Bilirubin: 0.5 mg/dL (ref 0.2–1.2)
Total Protein: 6.7 g/dL (ref 6.0–8.3)

## 2022-05-13 LAB — CBC
HCT: 41.7 % (ref 36.0–46.0)
Hemoglobin: 14 g/dL (ref 12.0–15.0)
MCHC: 33.6 g/dL (ref 30.0–36.0)
MCV: 93 fl (ref 78.0–100.0)
Platelets: 445 10*3/uL — ABNORMAL HIGH (ref 150.0–400.0)
RBC: 4.49 Mil/uL (ref 3.87–5.11)
RDW: 12.6 % (ref 11.5–15.5)
WBC: 9.3 10*3/uL (ref 4.0–10.5)

## 2022-05-13 LAB — LIPID PANEL
Cholesterol: 153 mg/dL (ref 0–200)
HDL: 45 mg/dL (ref >39.00)
LDL Cholesterol: 78 mg/dL (ref 0–99)
NonHDL: 107.91
Total CHOL/HDL Ratio: 3
Triglycerides: 148 mg/dL (ref 0.0–149.0)
VLDL: 29.6 mg/dL (ref 0.0–40.0)

## 2022-05-13 LAB — HEMOGLOBIN A1C: Hgb A1c MFr Bld: 4.8 % (ref 4.6–6.5)

## 2022-05-13 LAB — VITAMIN D 25 HYDROXY (VIT D DEFICIENCY, FRACTURES): VITD: 28.23 ng/mL — ABNORMAL LOW (ref 30.00–100.00)

## 2022-05-13 LAB — TSH: TSH: 3.29 u[IU]/mL (ref 0.35–5.50)

## 2022-05-16 NOTE — Assessment & Plan Note (Signed)
Alprazolam prn has been used occasionally. Her husband now has stage 4 recurrent cancer and it has spread to his his peritoneal cavity and she is very sad at this point. Prognosis is not good. He is tolerating treatments but was given 6 months in August. Refill given on Alprazolam today

## 2022-05-16 NOTE — Assessment & Plan Note (Signed)
On Levothyroxine, continue to monitor 

## 2022-05-16 NOTE — Assessment & Plan Note (Signed)
Well controlled, no changes to meds. Encouraged heart healthy diet such as the DASH diet and exercise as tolerated.  °

## 2022-05-16 NOTE — Assessment & Plan Note (Signed)
Supplement and monitor 

## 2022-05-16 NOTE — Assessment & Plan Note (Signed)
hgba1c acceptable, minimize simple carbs. Increase exercise as tolerated.  

## 2022-05-16 NOTE — Assessment & Plan Note (Signed)
Tolerating statin, encouraged heart healthy diet, avoid trans fats, minimize simple carbs and saturated fats. Increase exercise as tolerated 

## 2022-05-17 ENCOUNTER — Ambulatory Visit (INDEPENDENT_AMBULATORY_CARE_PROVIDER_SITE_OTHER): Payer: No Typology Code available for payment source | Admitting: Family Medicine

## 2022-05-17 ENCOUNTER — Other Ambulatory Visit: Payer: Self-pay

## 2022-05-17 DIAGNOSIS — E559 Vitamin D deficiency, unspecified: Secondary | ICD-10-CM | POA: Diagnosis not present

## 2022-05-17 DIAGNOSIS — E785 Hyperlipidemia, unspecified: Secondary | ICD-10-CM | POA: Diagnosis not present

## 2022-05-17 DIAGNOSIS — F419 Anxiety disorder, unspecified: Secondary | ICD-10-CM

## 2022-05-17 DIAGNOSIS — E038 Other specified hypothyroidism: Secondary | ICD-10-CM

## 2022-05-17 DIAGNOSIS — R739 Hyperglycemia, unspecified: Secondary | ICD-10-CM | POA: Diagnosis not present

## 2022-05-17 DIAGNOSIS — B001 Herpesviral vesicular dermatitis: Secondary | ICD-10-CM

## 2022-05-17 DIAGNOSIS — I10 Essential (primary) hypertension: Secondary | ICD-10-CM

## 2022-05-17 DIAGNOSIS — F32A Depression, unspecified: Secondary | ICD-10-CM

## 2022-05-17 MED ORDER — ALPRAZOLAM 0.25 MG PO TABS
0.2500 mg | ORAL_TABLET | Freq: Two times a day (BID) | ORAL | 1 refills | Status: DC | PRN
  Filled 2022-05-17: qty 30, 15d supply, fill #0
  Filled 2022-07-30: qty 30, 15d supply, fill #1

## 2022-05-17 MED ORDER — VALACYCLOVIR HCL 1 G PO TABS
1000.0000 mg | ORAL_TABLET | Freq: Two times a day (BID) | ORAL | 2 refills | Status: DC | PRN
  Filled 2022-05-17: qty 30, 15d supply, fill #0
  Filled 2022-11-05: qty 30, 15d supply, fill #1

## 2022-05-17 NOTE — Assessment & Plan Note (Signed)
None presently but keeps Valtrex on hand which is helpful when they occur. Refill given today

## 2022-05-17 NOTE — Patient Instructions (Signed)

## 2022-05-17 NOTE — Progress Notes (Signed)
Subjective:   By signing my name below, I, Shehryar Baig, attest that this documentation has been prepared under the direction and in the presence of [name], MD 05/17/2022      Patient ID: Katie Henry, female    DOB: 04-10-1971, 51 y.o.   MRN: [id number]  Chief Complaint  Patient presents with   Follow-up    Here for follow up     HPI Patient is in today for a follow up visit.   She reports having increased stress in her daily life. Her husband was recently diagnosed with stage 4 cancer and was given 6 months left to live from his oncologist. She continues taking 0.25 mg xanax PRN to help manage her anxiety. She has been taking it more often since her husbands diagnosis. She is requesting a refill on it as well. She is also interested in taking a daily medication to help manage her mood and anxiety.  She reports having an episode of vertigo last Saturday. She as experiencing dizziness and aura during her episode. She has seen an urgent care provider to manage her symptoms and was given meclizine. Her last vitamin D levels were low during her lab work. She continues taking vitamin D supplements but reports she is not consistent in taking it.  She is eligible for the shingles vaccine and is interested in getting it at a later date. She was recommended the new Covid-19 vaccine and is interested in receiving it at a later date.    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   IBS (irritable bowel syndrome)    Migraine    Syncope    Vitamin D deficiency     Past Surgical History:  Procedure Laterality Date     treatment  [place]  2020   ssp   CYSTOSCOPY/URETEROSCOPY/HOLMIUM LASER/STENT PLACEMENT Left 08/09/2017   Procedure: CYSTOSCOPY/RETROGRADE/URETEROSCOPY/HOLMIUM LASER/STENT PLACEMENT;  Surgeon: Festus Aloe, MD;  Location: [facility];  Service: Urology;  Laterality: Left;  ONLY NEEDS 60 MIN   ENDOMETRIAL ABLATION  01/2007   EXTRACORPOREAL SHOCK WAVE LITHOTRIPSY Left 07/21/2017   Procedure: LEFT EXTRACORPOREAL SHOCK WAVE LITHOTRIPSY (ESWL);  Surgeon: Bjorn Loser, MD;  Location: [facility];  Service: Urology;  Laterality: Left;   VAGINAL HYSTERECTOMY  2010   ovaries still in place    Family History  Problem Relation Age of Onset   Hyperlipidemia Mother    Heart disease Mother        bicuspid mitral valve   Hypertension Father    Dementia Father    Seizures Father        s/p TBI   Graves' disease Sister    Obesity Brother    Heart attack Maternal Aunt        deceased age 45   Heart disease Maternal Aunt    Stomach cancer Paternal Uncle    Hyperlipidemia Maternal Grandmother    Diabetes Maternal Grandmother    Kidney disease Maternal Grandmother  Hyperlipidemia Maternal Grandfather    Heart disease Maternal Grandfather 28       MI   Prostate cancer Maternal Grandfather    Hypertension Paternal Grandfather    Prostate cancer Other        maternal and paternal grandparents   Colon cancer Neg Hx    Esophageal cancer Neg Hx    Colon polyps Neg Hx    Rectal cancer Neg Hx     Social History   Socioeconomic History   Marital status: Married    Spouse name: Dennice Tindol   Number of children: 2   Years of education: 13   Highest education level: Not on file  Occupational History    Employer: [employer]  Tobacco Use   Smoking status: Former    Packs/day: 0.25    Years: 6.00    Total pack years: 1.50    Types: Cigarettes   Smokeless tobacco: Never   Tobacco comments:    quit 2010  Vaping Use   Vaping Use: Never used  Substance and  Sexual Activity   Alcohol use: Not Currently    Comment: Ocassionally   Drug use: Never   Sexual activity: Yes    Partners: Male    Birth control/protection: Post-menopausal    Comment: work at [employer], lives with fiance and son  Other Topics Concern   Not on file  Social History Narrative   Engaged, two sons.   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   No Tob, occ alcohol, no drugs.   Social Determinants of Health   Financial Resource Strain: Not on file  Food Insecurity: Not on file  Transportation Needs: Not on file  Physical Activity: Not on file  Stress: Not on file  Social Connections: Not on file  Intimate Partner Violence: Not on file    Outpatient Medications Prior to Visit  Medication Sig Dispense Refill   acetaminophen (TYLENOL) 500 MG tablet Take 1,000 mg by mouth every 6 (six) hours as needed for mild pain or moderate pain.     amLODipine (NORVASC) 5 MG tablet Take 1 tablet (5 mg total) by mouth daily. 90 tablet 1   atorvastatin (LIPITOR) 10 MG tablet Take 1 tablet (10 mg total) by mouth daily. 90 tablet 3   cetirizine (ZYRTEC) 10 MG tablet Take 1 tablet (10 mg total) by mouth daily. (Patient taking differently: Take 10 mg by mouth daily as needed.) 100 tablet 0   cholecalciferol (VITAMIN D3) 25 MCG (1000 UT) tablet Take 5,000 Units by mouth daily.     fluticasone (FLONASE) 50 MCG/ACT nasal spray Place 2 sprays into both nostrils daily. 16 g 6   hydrochlorothiazide (MICROZIDE) 12.5 MG capsule Take 1 capsule (12.5 mg total) by mouth daily as needed. For BP>145/90 90 capsule 1   meclizine (ANTIVERT) 50 MG tablet Take 1 tablet (50 mg total) by mouth 2 (two) times daily as needed for dizziness or nausea. 30 tablet 0   Multiple Vitamins-Minerals (MULTIVITAMIN WITH MINERALS) tablet Take 1 tablet by mouth daily.     promethazine (PHENERGAN) 12.5 MG tablet Take 1 tablet (12.5 mg total) by mouth every 8 (eight) hours as needed for  nausea or vomiting. 15 tablet 0   SYNTHROID 175 MCG tablet Take 1 tablet (175 mcg total) by mouth daily before breakfast. 30 tablet 3   Vitamin D, Ergocalciferol, (DRISDOL) 1.25 MG (50000 UT) CAPS capsule Take 1 capsule (50,000 Units total) by mouth every 7 (  seven) days. 4 capsule 4   ALPRAZolam (XANAX) 0.25 MG tablet Take 1 tablet (0.25 mg total) by mouth 2 (two) times daily as needed for anxiety. 30 tablet 1   valACYclovir (VALTREX) 1000 MG tablet Take 1 tablet (1,000 mg total) by mouth 2 (two) times daily. (Patient taking differently: Take 1,000 mg by mouth 2 (two) times daily as needed.) 60 tablet 0   No facility-administered medications prior to visit.    Allergies  Allergen Reactions   Zofran [Ondansetron Hcl] Other (See Comments)    Pt has hx of migraines; medication increases headaches and nausea    Amoxicillin Nausea And Vomiting and Other (See Comments)    Has patient had a PCN reaction causing immediate rash, facial/tongue/throat swelling, SOB or lightheadedness with hypotension: Unknown Has patient had a PCN reaction causing severe rash involving mucus membranes or skin necrosis: No Has patient had a PCN reaction that required hospitalization: No Has patient had a PCN reaction occurring within the last 10 years: No If all of the above answers are "NO", then may proceed with Cephalosporin use.    Codeine Nausea And Vomiting   Imitrex [Sumatriptan] Nausea And Vomiting   Ultram [Tramadol Hcl] Nausea And Vomiting    Review of Systems  Psychiatric/Behavioral:  The patient is nervous/anxious.        Objective:    Physical Exam Constitutional:      General: She is not in acute distress.    Appearance: Normal appearance. She is not ill-appearing.  HENT:     Head: Normocephalic and atraumatic.     Right Ear: External ear normal.     Left Ear: External ear normal.  Eyes:     Extraocular Movements: Extraocular movements intact.     Pupils: Pupils are equal, round, and  reactive to light.  Cardiovascular:     Rate and Rhythm: Normal rate and regular rhythm.     Heart sounds: Normal heart sounds. No murmur heard.    No gallop.  Pulmonary:     Effort: Pulmonary effort is normal. No respiratory distress.     Breath sounds: Normal breath sounds. No wheezing or rales.  Skin:    General: Skin is warm and dry.  Neurological:     Mental Status: She is alert and oriented to person, place, and time.  Psychiatric:        Judgment: Judgment normal.     BP 138/78 (BP Location: Right Arm, Patient Position: Sitting, Cuff Size: Normal)   Pulse 94   Temp 98 F (36.7 C) (Oral)   Resp 16   Ht '5\' 3"'$  (1.6 m)   Wt 190 lb (86.2 kg)   SpO2 97%   BMI 33.66 kg/m  Wt Readings from Last 3 Encounters:  05/17/22 190 lb (86.2 kg)  02/22/22 196 lb (88.9 kg)  02/09/22 190 lb (86.2 kg)    Diabetic Foot Exam - Simple   No data filed    Lab Results  Component Value Date   WBC 9.3 05/13/2022   HGB 14.0 05/13/2022   HCT 41.7 05/13/2022   PLT 445.0 (H) 05/13/2022   GLUCOSE 93 05/13/2022   CHOL 153 05/13/2022   TRIG 148.0 05/13/2022   HDL 45.00 05/13/2022   LDLDIRECT 167.0 07/24/2019   LDLCALC 78 05/13/2022   ALT 17 05/13/2022   AST 13 05/13/2022   NA 136 05/13/2022   K 3.8 05/13/2022   CL 103 05/13/2022   CREATININE 0.60 05/13/2022   BUN 9 05/13/2022  CO2 28 05/13/2022   TSH 3.29 05/13/2022   HGBA1C 4.8 05/13/2022    Lab Results  Component Value Date   TSH 3.29 05/13/2022   Lab Results  Component Value Date   WBC 9.3 05/13/2022   HGB 14.0 05/13/2022   HCT 41.7 05/13/2022   MCV 93.0 05/13/2022   PLT 445.0 (H) 05/13/2022   Lab Results  Component Value Date   NA 136 05/13/2022   K 3.8 05/13/2022   CO2 28 05/13/2022   GLUCOSE 93 05/13/2022   BUN 9 05/13/2022   CREATININE 0.60 05/13/2022   BILITOT 0.5 05/13/2022   ALKPHOS 60 05/13/2022   AST 13 05/13/2022   ALT 17 05/13/2022   PROT 6.7 05/13/2022   ALBUMIN 3.9 05/13/2022   CALCIUM 9.5  05/13/2022   ANIONGAP 10 11/25/2019   GFR 103.98 05/13/2022   Lab Results  Component Value Date   CHOL 153 05/13/2022   Lab Results  Component Value Date   HDL 45.00 05/13/2022   Lab Results  Component Value Date   LDLCALC 78 05/13/2022   Lab Results  Component Value Date   TRIG 148.0 05/13/2022   Lab Results  Component Value Date   CHOLHDL 3 05/13/2022   Lab Results  Component Value Date   HGBA1C 4.8 05/13/2022       Assessment & Plan:   Problem List Items Addressed This Visit     Vitamin D deficiency    Supplement and monitor      Hypothyroidism    On Levothyroxine, continue to monitor      Anxiety and depression    Alprazolam prn has been used occasionally. Her husband now has stage 4 recurrent cancer and it has spread to his his peritoneal cavity and she is very sad at this point. Prognosis is not good. He is tolerating treatments but was given 6 months in August. Refill given on Alprazolam today      Relevant Medications   ALPRAZolam (XANAX) 0.25 MG tablet   Hyperglycemia    hgba1c acceptable, minimize simple carbs. Increase exercise as tolerated.       Hyperlipidemia    Tolerating statin, encouraged heart healthy diet, avoid trans fats, minimize simple carbs and saturated fats. Increase exercise as tolerated      Essential hypertension    Well controlled, no changes to meds. Encouraged heart healthy diet such as the DASH diet and exercise as tolerated.       Recurrent cold sores    None presently but keeps Valtrex on hand which is helpful when they occur. Refill given today      Relevant Medications   valACYclovir (VALTREX) 1000 MG tablet     Meds ordered this encounter  Medications   ALPRAZolam (XANAX) 0.25 MG tablet    Sig: Take 1 tablet (0.25 mg total) by mouth 2 (two) times daily as needed for anxiety.    Dispense:  30 tablet    Refill:  1   valACYclovir (VALTREX) 1000 MG tablet    Sig: Take 1 tablet (1,000 mg total) by mouth 2  (two) times daily as needed.    Dispense:  30 tablet    Refill:  2    I, Penni Homans, MD, personally preformed the services described in this documentation.  All medical record entries made by the scribe were at my direction and in my presence.  I have reviewed the chart and discharge instructions (if applicable) and agree that the record reflects my personal performance and  is accurate and complete. 05/17/2022   I,Shehryar Baig,acting as a scribe for Penni Homans, MD.,have documented all relevant documentation on the behalf of Penni Homans, MD,as directed by  Penni Homans, MD while in the presence of Penni Homans, MD.   Penni Homans, MD

## 2022-07-20 DIAGNOSIS — F411 Generalized anxiety disorder: Secondary | ICD-10-CM | POA: Diagnosis not present

## 2022-07-30 ENCOUNTER — Other Ambulatory Visit: Payer: Self-pay

## 2022-07-30 ENCOUNTER — Other Ambulatory Visit: Payer: Self-pay | Admitting: Family Medicine

## 2022-07-30 MED ORDER — SYNTHROID 175 MCG PO TABS
175.0000 ug | ORAL_TABLET | Freq: Every day | ORAL | 3 refills | Status: DC
  Filled 2022-07-30: qty 30, 30d supply, fill #0
  Filled 2022-09-09: qty 30, 30d supply, fill #1
  Filled 2022-10-21: qty 30, 30d supply, fill #2
  Filled 2022-12-10: qty 30, 30d supply, fill #3

## 2022-08-03 ENCOUNTER — Other Ambulatory Visit: Payer: Self-pay

## 2022-08-03 DIAGNOSIS — F411 Generalized anxiety disorder: Secondary | ICD-10-CM | POA: Diagnosis not present

## 2022-08-05 ENCOUNTER — Other Ambulatory Visit: Payer: Self-pay

## 2022-08-11 ENCOUNTER — Encounter: Payer: Self-pay | Admitting: Family Medicine

## 2022-08-11 ENCOUNTER — Other Ambulatory Visit: Payer: Self-pay

## 2022-08-12 ENCOUNTER — Other Ambulatory Visit: Payer: Self-pay

## 2022-08-12 MED ORDER — WEGOVY 0.25 MG/0.5ML ~~LOC~~ SOAJ
0.2500 mg | SUBCUTANEOUS | 1 refills | Status: DC
  Filled 2022-08-12: qty 2, 28d supply, fill #0
  Filled 2022-10-11: qty 2, 28d supply, fill #1

## 2022-08-13 ENCOUNTER — Telehealth: Payer: Self-pay

## 2022-08-13 NOTE — Telephone Encounter (Signed)
PA initiated via Covermymeds; KEY: [id number]. Awaiting determination.

## 2022-08-16 ENCOUNTER — Other Ambulatory Visit: Payer: Self-pay | Admitting: Family

## 2022-08-16 NOTE — Telephone Encounter (Signed)
Appt was schedule

## 2022-08-16 NOTE — Telephone Encounter (Signed)
PA approved.    The request has been approved. The authorization is effective from 08/14/2022 to 03/05/2023, as long as the member is enrolled in their current health plan. The request was approved as submitted. This request has been approved for 21m per 28 days.The following prior authorizations have also been entered effective 08/14/2022 through 03/05/2023: Wegovy 0.'5mg'$ /0.538m allowing 70m58mer 28 days; please reference authorization number [id number] '1mg'$ /0.5mL94mllowing 70mL 25m 28 days; please reference authorization number [id number] 1.'7mg'$ /0.75mL,470mowing 3mL pe51m8 days; please reference authorization number [id number] 2.'4mg'$ /0.75mL, a39ming 3mL per 4mdays; please reference authorization number [id number]. [phone] notification letter will follow with additional details.

## 2022-08-17 ENCOUNTER — Other Ambulatory Visit: Payer: Self-pay

## 2022-08-17 ENCOUNTER — Other Ambulatory Visit: Payer: Self-pay | Admitting: Family

## 2022-08-17 DIAGNOSIS — F411 Generalized anxiety disorder: Secondary | ICD-10-CM | POA: Diagnosis not present

## 2022-08-18 ENCOUNTER — Other Ambulatory Visit (HOSPITAL_BASED_OUTPATIENT_CLINIC_OR_DEPARTMENT_OTHER): Payer: Self-pay

## 2022-08-18 ENCOUNTER — Other Ambulatory Visit: Payer: Self-pay

## 2022-08-18 MED ORDER — AMLODIPINE BESYLATE 5 MG PO TABS
5.0000 mg | ORAL_TABLET | Freq: Every day | ORAL | 1 refills | Status: DC
  Filled 2022-08-18: qty 90, 90d supply, fill #0
  Filled 2022-12-10: qty 90, 90d supply, fill #1
  Filled 2022-12-15: qty 90, 90d supply, fill #0

## 2022-08-18 MED ORDER — HYDROCHLOROTHIAZIDE 12.5 MG PO CAPS
12.5000 mg | ORAL_CAPSULE | Freq: Every day | ORAL | 1 refills | Status: DC | PRN
  Filled 2022-08-18: qty 90, 90d supply, fill #0

## 2022-08-23 ENCOUNTER — Other Ambulatory Visit: Payer: Self-pay

## 2022-08-27 ENCOUNTER — Other Ambulatory Visit: Payer: Self-pay

## 2022-08-30 ENCOUNTER — Other Ambulatory Visit: Payer: Self-pay

## 2022-09-10 ENCOUNTER — Other Ambulatory Visit: Payer: Self-pay

## 2022-09-17 DIAGNOSIS — F411 Generalized anxiety disorder: Secondary | ICD-10-CM | POA: Diagnosis not present

## 2022-09-20 ENCOUNTER — Encounter: Payer: Self-pay | Admitting: Family Medicine

## 2022-09-28 DIAGNOSIS — F411 Generalized anxiety disorder: Secondary | ICD-10-CM | POA: Diagnosis not present

## 2022-10-08 ENCOUNTER — Other Ambulatory Visit (HOSPITAL_BASED_OUTPATIENT_CLINIC_OR_DEPARTMENT_OTHER): Payer: Self-pay

## 2022-10-08 MED ORDER — AMOXICILLIN 500 MG PO CAPS
1000.0000 mg | ORAL_CAPSULE | ORAL | 0 refills | Status: AC
  Filled 2022-10-08: qty 24, 6d supply, fill #0
  Filled 2022-10-11: qty 24, 12d supply, fill #0

## 2022-10-08 NOTE — Progress Notes (Signed)
MyChart Video Visit    Virtual Visit via Video Note   Patient location: Patient and provider in visit Provider location: Office  I discussed the limitations of evaluation and management by telemedicine and the availability of in person appointments. The patient expressed understanding and agreed to proceed.  Visit Date: 10/11/2022  Today's healthcare provider: Penni Homans, MD     Subjective:    Patient ID: Katie Henry, female    DOB: Oct 26, 1970, 52 y.o.   MRN: [id number]  No chief complaint on file.   HPI Patient is in today for a telehealth video visit.  Stress/Mood:  She has been under stress as her husband was recently diagnosed with lymphoma. Otherwise she has been feeling stable. Now she is seeing her counselor every other Tuesday.  Weight loss:  She reports losing weight from 195 lbs down to 189 lbs in the first 4 weeks on Wegovy. Recently she obtained a stationary exercise bike to use at home. She has gradually worked up to 20 minutes of exercise. Also she is doing well with cutting back on ordering out for her meals.   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   IBS (irritable bowel syndrome)    Migraine    Syncope    Vitamin D deficiency     Past Surgical History:  Procedure Laterality Date    treatment  [place]  2020   ssp   CYSTOSCOPY/URETEROSCOPY/HOLMIUM LASER/STENT PLACEMENT Left 08/09/2017   Procedure: CYSTOSCOPY/RETROGRADE/URETEROSCOPY/HOLMIUM LASER/STENT PLACEMENT;  Surgeon: Festus Aloe, MD;  Location: [facility];  Service: Urology;  Laterality:  Left;  ONLY NEEDS 60 MIN   ENDOMETRIAL ABLATION  01/2007   EXTRACORPOREAL SHOCK WAVE LITHOTRIPSY Left 07/21/2017   Procedure: LEFT EXTRACORPOREAL SHOCK WAVE LITHOTRIPSY (ESWL);  Surgeon: Bjorn Loser, MD;  Location: [facility];  Service: Urology;  Laterality: Left;   VAGINAL HYSTERECTOMY  2010   ovaries still in place    Family History  Problem Relation Age of Onset   Hyperlipidemia Mother    Heart disease Mother        bicuspid mitral valve   Hypertension Father    Dementia Father    Seizures Father        s/p TBI   Graves' disease Sister    Obesity Brother    Heart attack Maternal Aunt        deceased age 64   Heart disease Maternal Aunt    Stomach cancer Paternal Uncle    Hyperlipidemia Maternal Grandmother    Diabetes Maternal Grandmother    Kidney disease Maternal Grandmother    Hyperlipidemia Maternal Grandfather    Heart disease Maternal Grandfather 4       MI   Prostate cancer Maternal Grandfather    Hypertension Paternal Grandfather    Prostate cancer Other        maternal and paternal grandparents   Colon cancer Neg Hx    Esophageal cancer Neg Hx    Colon polyps  Neg Hx    Rectal cancer Neg Hx     Social History   Socioeconomic History   Marital status: Married    Spouse name: Reyne Sumption   Number of children: 2   Years of education: 13   Highest education level: Not on file  Occupational History    Employer: [employer]  Tobacco Use   Smoking status: Former    Packs/day: 0.25    Years: 6.00    Additional pack years: 0.00    Total pack years: 1.50    Types: Cigarettes   Smokeless tobacco: Never   Tobacco comments:    quit 2010  Vaping Use   Vaping Use: Never used  Substance and Sexual Activity   Alcohol use: Not Currently    Comment: Ocassionally   Drug use: Never   Sexual activity: Yes    Partners: Male    Birth control/protection: Post-menopausal    Comment: work at [employer], lives with fiance and son  Other Topics Concern   Not on  file  Social History Narrative   Engaged, two sons.   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   No Tob, occ alcohol, no drugs.   Social Determinants of Health   Financial Resource Strain: Not on file  Food Insecurity: Not on file  Transportation Needs: Not on file  Physical Activity: Not on file  Stress: Not on file  Social Connections: Not on file  Intimate Partner Violence: Not on file    Outpatient Medications Prior to Visit  Medication Sig Dispense Refill   acetaminophen (TYLENOL) 500 MG tablet Take 1,000 mg by mouth every 6 (six) hours as needed for mild pain or moderate pain.     ALPRAZolam (XANAX) 0.25 MG tablet Take 1 tablet (0.25 mg total) by mouth 2 (two) times daily as needed for anxiety. 30 tablet 1   amLODipine (NORVASC) 5 MG tablet Take 1 tablet (5 mg total) by mouth daily. 90 tablet 1   atorvastatin (LIPITOR) 10 MG tablet Take 1 tablet (10 mg total) by mouth daily. 90 tablet 3   cetirizine (ZYRTEC) 10 MG tablet Take 1 tablet (10 mg total) by mouth daily. (Patient taking differently: Take 10 mg by mouth daily as needed.) 100 tablet 0   cholecalciferol (VITAMIN D3) 25 MCG (1000 UT) tablet Take 5,000 Units by mouth daily.     fluticasone (FLONASE) 50 MCG/ACT nasal spray Place 2 sprays into both nostrils daily. 16 g 6   hydrochlorothiazide (MICROZIDE) 12.5 MG capsule Take 1 capsule (12.5 mg total) by mouth daily as needed. For BP>145/90 90 capsule 1   meclizine (ANTIVERT) 50 MG tablet Take 1 tablet (50 mg total) by mouth 2 (two) times daily as needed for dizziness or nausea. 30 tablet 0   Multiple Vitamins-Minerals (MULTIVITAMIN WITH MINERALS) tablet Take 1 tablet by mouth daily.     promethazine (PHENERGAN) 12.5 MG tablet Take 1 tablet (12.5 mg total) by mouth every 8 (eight) hours as needed for nausea or vomiting. 15 tablet 0   SYNTHROID 175 MCG tablet Take 1 tablet (175 mcg total) by mouth daily before breakfast. 30 tablet 3    valACYclovir (VALTREX) 1000 MG tablet Take 1 tablet (1,000 mg total) by mouth 2 (two) times daily as needed. 30 tablet 2   Vitamin D, Ergocalciferol, (DRISDOL) 1.25 MG (50000 UT) CAPS capsule Take 1 capsule (50,000 Units total) by mouth every 7 (seven) days. 4 capsule 4   Semaglutide-Weight  Management (WEGOVY) 0.25 MG/0.5ML SOAJ Inject 0.25 mg into the skin once a week. 2 mL 1   No facility-administered medications prior to visit.    Allergies  Allergen Reactions   Zofran [Ondansetron Hcl] Other (See Comments)    Pt has hx of migraines; medication increases headaches and nausea    Amoxicillin Nausea And Vomiting and Other (See Comments)    Has patient had a PCN reaction causing immediate rash, facial/tongue/throat swelling, SOB or lightheadedness with hypotension: Unknown Has patient had a PCN reaction causing severe rash involving mucus membranes or skin necrosis: No Has patient had a PCN reaction that required hospitalization: No Has patient had a PCN reaction occurring within the last 10 years: No If all of the above answers are "NO", then may proceed with Cephalosporin use.    Codeine Nausea And Vomiting   Imitrex [Sumatriptan] Nausea And Vomiting   Ultram [Tramadol Hcl] Nausea And Vomiting    Review of Systems  Constitutional:  Negative for fever.  HENT: Negative.    Respiratory: Negative.    Cardiovascular: Negative.   Gastrointestinal: Negative.   Genitourinary: Negative.   Musculoskeletal: Negative.   Skin: Negative.   Psychiatric/Behavioral:  The patient is nervous/anxious.    See HPI.     Objective:     Physical Exam: Gen: Awake, alert, no acute distress. Resp: Breathing is even and non-labored. Psych: calm/pleasant demeanor. Neuro: Alert and Oriented x3, + facial symmetry, speech is clear.  There were no vitals taken for this visit. Wt Readings from Last 3 Encounters:  05/17/22 190 lb (86.2 kg)  02/22/22 196 lb (88.9 kg)  02/09/22 190 lb (86.2 kg)     Diabetic Foot Exam - Simple   No data filed    Lab Results  Component Value Date   WBC 9.3 05/13/2022   HGB 14.0 05/13/2022   HCT 41.7 05/13/2022   PLT 445.0 (H) 05/13/2022   GLUCOSE 93 05/13/2022   CHOL 153 05/13/2022   TRIG 148.0 05/13/2022   HDL 45.00 05/13/2022   LDLDIRECT 167.0 07/24/2019   LDLCALC 78 05/13/2022   ALT 17 05/13/2022   AST 13 05/13/2022   NA 136 05/13/2022   K 3.8 05/13/2022   CL 103 05/13/2022   CREATININE 0.60 05/13/2022   BUN 9 05/13/2022   CO2 28 05/13/2022   TSH 3.29 05/13/2022   HGBA1C 4.8 05/13/2022    Lab Results  Component Value Date   TSH 3.29 05/13/2022   Lab Results  Component Value Date   WBC 9.3 05/13/2022   HGB 14.0 05/13/2022   HCT 41.7 05/13/2022   MCV 93.0 05/13/2022   PLT 445.0 (H) 05/13/2022   Lab Results  Component Value Date   NA 136 05/13/2022   K 3.8 05/13/2022   CO2 28 05/13/2022   GLUCOSE 93 05/13/2022   BUN 9 05/13/2022   CREATININE 0.60 05/13/2022   BILITOT 0.5 05/13/2022   ALKPHOS 60 05/13/2022   AST 13 05/13/2022   ALT 17 05/13/2022   PROT 6.7 05/13/2022   ALBUMIN 3.9 05/13/2022   CALCIUM 9.5 05/13/2022   ANIONGAP 10 11/25/2019   GFR 103.98 05/13/2022   Lab Results  Component Value Date   CHOL 153 05/13/2022   Lab Results  Component Value Date   HDL 45.00 05/13/2022   Lab Results  Component Value Date   LDLCALC 78 05/13/2022   Lab Results  Component Value Date   TRIG 148.0 05/13/2022   Lab Results  Component Value Date  CHOLHDL 3 05/13/2022   Lab Results  Component Value Date   HGBA1C 4.8 05/13/2022       Assessment & Plan:   Problem List Items Addressed This Visit     Anxiety and depression    Continues to struggle with the stress of managing work and her husband's cancer but overall she feels she is managing itmuch better than she was at her last visit. No changes at this time      Class 1 obesity with serious comorbidity and body mass index (BMI) of 33.0 to 33.9 in  adult    Has used wegovy and that has worked well she is allowed refills and Encouraged DASH or MIND diet, decrease po intake and increase exercise as tolerated. Needs 7-8 hours of sleep nightly. Avoid trans fats, eat small, frequent meals every 4-5 hours with lean proteins, complex carbs and healthy fats. Minimize simple carbs, high fat foods and processed foods       Relevant Medications   Semaglutide-Weight Management (WEGOVY) 0.25 MG/0.5ML SOAJ   Essential hypertension - Primary    Well controlled, no changes to meds. Encouraged heart healthy diet such as the DASH diet and exercise as tolerated.       Hyperglycemia    hgba1c acceptable, minimize simple carbs. Increase exercise as tolerated.       Hyperlipidemia    Tolerating statin, encouraged heart healthy diet, avoid trans fats, minimize simple carbs and saturated fats. Increase exercise as tolerated      Hypothyroidism    On Levothyroxine, continue to monitor      Vitamin D deficiency    Supplement and monitor         Meds ordered this encounter  Medications   Semaglutide-Weight Management (WEGOVY) 0.25 MG/0.5ML SOAJ    Sig: Inject 0.25 mg into the skin once a week.    Dispense:  6 mL    Refill:  0    I discussed the assessment and treatment plan with the patient. The patient was provided an opportunity to ask questions and all were answered. The patient agreed with the plan and demonstrated an understanding of the instructions.   The patient was advised to call back or seek an in-person evaluation if the symptoms worsen or if the condition fails to improve as anticipated.   I,Mathew Stumpf,acting as a Education administrator for Penni Homans, MD.,have documented all relevant documentation on the behalf of Penni Homans, MD,as directed by  Penni Homans, MD while in the presence of Penni Homans, MD.  I, Penni Homans, MD, personally preformed the services described in this documentation.  All medical record entries made by the scribe  were at my direction and in my presence.  I have reviewed the chart and discharge instructions (if applicable) and agree that the record reflects my personal performance and is accurate and complete. 10/11/2022.  Penni Homans, MD [facility] [place] (phone) [phone] (fax)  [place]

## 2022-10-10 NOTE — Assessment & Plan Note (Signed)
hgba1c acceptable, minimize simple carbs. Increase exercise as tolerated.  

## 2022-10-10 NOTE — Assessment & Plan Note (Signed)
Has used wegovy and that has worked well she is allowed refills and Encouraged DASH or MIND diet, decrease po intake and increase exercise as tolerated. Needs 7-8 hours of sleep nightly. Avoid trans fats, eat small, frequent meals every 4-5 hours with lean proteins, complex carbs and healthy fats. Minimize simple carbs, high fat foods and processed foods

## 2022-10-10 NOTE — Assessment & Plan Note (Signed)
On Levothyroxine, continue to monitor 

## 2022-10-10 NOTE — Assessment & Plan Note (Signed)
Supplement and monitor 

## 2022-10-10 NOTE — Assessment & Plan Note (Signed)
Well controlled, no changes to meds. Encouraged heart healthy diet such as the DASH diet and exercise as tolerated.  °

## 2022-10-10 NOTE — Assessment & Plan Note (Signed)
Tolerating statin, encouraged heart healthy diet, avoid trans fats, minimize simple carbs and saturated fats. Increase exercise as tolerated 

## 2022-10-11 ENCOUNTER — Other Ambulatory Visit: Payer: Self-pay

## 2022-10-11 ENCOUNTER — Telehealth (INDEPENDENT_AMBULATORY_CARE_PROVIDER_SITE_OTHER): Payer: 59 | Admitting: Family Medicine

## 2022-10-11 ENCOUNTER — Other Ambulatory Visit (HOSPITAL_BASED_OUTPATIENT_CLINIC_OR_DEPARTMENT_OTHER): Payer: Self-pay

## 2022-10-11 ENCOUNTER — Encounter: Payer: Self-pay | Admitting: Family Medicine

## 2022-10-11 DIAGNOSIS — R739 Hyperglycemia, unspecified: Secondary | ICD-10-CM | POA: Diagnosis not present

## 2022-10-11 DIAGNOSIS — I10 Essential (primary) hypertension: Secondary | ICD-10-CM

## 2022-10-11 DIAGNOSIS — F32A Depression, unspecified: Secondary | ICD-10-CM | POA: Diagnosis not present

## 2022-10-11 DIAGNOSIS — E785 Hyperlipidemia, unspecified: Secondary | ICD-10-CM

## 2022-10-11 DIAGNOSIS — E559 Vitamin D deficiency, unspecified: Secondary | ICD-10-CM

## 2022-10-11 DIAGNOSIS — E669 Obesity, unspecified: Secondary | ICD-10-CM | POA: Diagnosis not present

## 2022-10-11 DIAGNOSIS — E038 Other specified hypothyroidism: Secondary | ICD-10-CM | POA: Diagnosis not present

## 2022-10-11 DIAGNOSIS — Z6833 Body mass index (BMI) 33.0-33.9, adult: Secondary | ICD-10-CM | POA: Diagnosis not present

## 2022-10-11 DIAGNOSIS — F419 Anxiety disorder, unspecified: Secondary | ICD-10-CM

## 2022-10-11 MED ORDER — WEGOVY 0.25 MG/0.5ML ~~LOC~~ SOAJ
0.2500 mg | SUBCUTANEOUS | 0 refills | Status: DC
  Filled 2022-11-03: qty 2, 28d supply, fill #0

## 2022-10-11 NOTE — Assessment & Plan Note (Signed)
Continues to struggle with the stress of managing work and her husband's cancer but overall she feels she is managing itmuch better than she was at her last visit. No changes at this time

## 2022-10-21 ENCOUNTER — Other Ambulatory Visit: Payer: Self-pay

## 2022-10-26 ENCOUNTER — Other Ambulatory Visit: Payer: Self-pay

## 2022-11-03 ENCOUNTER — Other Ambulatory Visit: Payer: Self-pay

## 2022-11-03 ENCOUNTER — Other Ambulatory Visit (HOSPITAL_BASED_OUTPATIENT_CLINIC_OR_DEPARTMENT_OTHER): Payer: Self-pay

## 2022-11-03 ENCOUNTER — Encounter: Payer: Self-pay | Admitting: Family Medicine

## 2022-11-03 MED ORDER — WEGOVY 0.25 MG/0.5ML ~~LOC~~ SOAJ
0.2500 mg | SUBCUTANEOUS | 0 refills | Status: DC
  Filled 2022-11-03: qty 6, 84d supply, fill #0
  Filled 2022-12-15: qty 2, 28d supply, fill #0
  Filled 2023-01-10: qty 6, 84d supply, fill #0

## 2022-11-05 ENCOUNTER — Other Ambulatory Visit: Payer: Self-pay

## 2022-11-05 DIAGNOSIS — F411 Generalized anxiety disorder: Secondary | ICD-10-CM | POA: Diagnosis not present

## 2022-11-10 ENCOUNTER — Other Ambulatory Visit: Payer: Self-pay

## 2022-11-25 ENCOUNTER — Other Ambulatory Visit (HOSPITAL_BASED_OUTPATIENT_CLINIC_OR_DEPARTMENT_OTHER): Payer: Self-pay

## 2022-12-07 DIAGNOSIS — F411 Generalized anxiety disorder: Secondary | ICD-10-CM | POA: Diagnosis not present

## 2022-12-10 ENCOUNTER — Other Ambulatory Visit: Payer: Self-pay

## 2022-12-10 ENCOUNTER — Other Ambulatory Visit (HOSPITAL_BASED_OUTPATIENT_CLINIC_OR_DEPARTMENT_OTHER): Payer: Self-pay

## 2022-12-15 ENCOUNTER — Other Ambulatory Visit (HOSPITAL_BASED_OUTPATIENT_CLINIC_OR_DEPARTMENT_OTHER): Payer: Self-pay

## 2022-12-15 ENCOUNTER — Other Ambulatory Visit: Payer: Self-pay

## 2022-12-21 ENCOUNTER — Other Ambulatory Visit: Payer: Self-pay | Admitting: Family Medicine

## 2022-12-21 ENCOUNTER — Encounter: Payer: Self-pay | Admitting: Family Medicine

## 2022-12-21 DIAGNOSIS — R739 Hyperglycemia, unspecified: Secondary | ICD-10-CM

## 2022-12-21 DIAGNOSIS — E038 Other specified hypothyroidism: Secondary | ICD-10-CM

## 2022-12-21 DIAGNOSIS — E785 Hyperlipidemia, unspecified: Secondary | ICD-10-CM

## 2022-12-21 DIAGNOSIS — E559 Vitamin D deficiency, unspecified: Secondary | ICD-10-CM

## 2022-12-21 DIAGNOSIS — F411 Generalized anxiety disorder: Secondary | ICD-10-CM | POA: Diagnosis not present

## 2022-12-21 DIAGNOSIS — I10 Essential (primary) hypertension: Secondary | ICD-10-CM

## 2022-12-22 ENCOUNTER — Other Ambulatory Visit (INDEPENDENT_AMBULATORY_CARE_PROVIDER_SITE_OTHER): Payer: 59

## 2022-12-22 DIAGNOSIS — E785 Hyperlipidemia, unspecified: Secondary | ICD-10-CM | POA: Diagnosis not present

## 2022-12-22 DIAGNOSIS — E559 Vitamin D deficiency, unspecified: Secondary | ICD-10-CM | POA: Diagnosis not present

## 2022-12-22 DIAGNOSIS — I10 Essential (primary) hypertension: Secondary | ICD-10-CM | POA: Diagnosis not present

## 2022-12-22 DIAGNOSIS — R739 Hyperglycemia, unspecified: Secondary | ICD-10-CM

## 2022-12-22 LAB — LIPID PANEL
Cholesterol: 162 mg/dL (ref 0–200)
HDL: 49.4 mg/dL (ref >39.00)
LDL Cholesterol: 89 mg/dL (ref 0–99)
NonHDL: 112.67
Total CHOL/HDL Ratio: 3
Triglycerides: 116 mg/dL (ref 0.0–149.0)
VLDL: 23.2 mg/dL (ref 0.0–40.0)

## 2022-12-22 LAB — CBC WITH DIFFERENTIAL/PLATELET
Basophils Absolute: 0.1 10*3/uL (ref 0.0–0.1)
Basophils Relative: 0.8 % (ref 0.0–3.0)
Eosinophils Absolute: 0.2 10*3/uL (ref 0.0–0.7)
Eosinophils Relative: 2.3 % (ref 0.0–5.0)
HCT: 43.1 % (ref 36.0–46.0)
Hemoglobin: 14.3 g/dL (ref 12.0–15.0)
Lymphocytes Relative: 29.7 % (ref 12.0–46.0)
Lymphs Abs: 1.9 10*3/uL (ref 0.7–4.0)
MCHC: 33.2 g/dL (ref 30.0–36.0)
MCV: 92.7 fl (ref 78.0–100.0)
Monocytes Absolute: 0.4 10*3/uL (ref 0.1–1.0)
Monocytes Relative: 5.8 % (ref 3.0–12.0)
Neutro Abs: 4 10*3/uL (ref 1.4–7.7)
Neutrophils Relative %: 61.4 % (ref 43.0–77.0)
Platelets: 449 10*3/uL — ABNORMAL HIGH (ref 150.0–400.0)
RBC: 4.64 Mil/uL (ref 3.87–5.11)
RDW: 12.6 % (ref 11.5–15.5)
WBC: 6.5 10*3/uL (ref 4.0–10.5)

## 2022-12-22 LAB — COMPREHENSIVE METABOLIC PANEL
ALT: 15 U/L (ref 0–35)
AST: 12 U/L (ref 0–37)
Albumin: 4 g/dL (ref 3.5–5.2)
Alkaline Phosphatase: 64 U/L (ref 39–117)
BUN: 11 mg/dL (ref 6–23)
CO2: 22 mEq/L (ref 19–32)
Calcium: 9.6 mg/dL (ref 8.4–10.5)
Chloride: 104 mEq/L (ref 96–112)
Creatinine, Ser: 0.65 mg/dL (ref 0.40–1.20)
GFR: 101.56 mL/min (ref >60.00)
Glucose, Bld: 88 mg/dL (ref 70–99)
Potassium: 4.2 mEq/L (ref 3.5–5.1)
Sodium: 138 mEq/L (ref 135–145)
Total Bilirubin: 0.5 mg/dL (ref 0.2–1.2)
Total Protein: 7.1 g/dL (ref 6.0–8.3)

## 2022-12-22 LAB — HEMOGLOBIN A1C: Hgb A1c MFr Bld: 4.7 % (ref 4.6–6.5)

## 2022-12-22 LAB — TSH: TSH: 3.21 u[IU]/mL (ref 0.35–5.50)

## 2022-12-25 LAB — VITAMIN D 1,25 DIHYDROXY
Vitamin D 1, 25 (OH)2 Total: 56 pg/mL (ref 18–72)
Vitamin D2 1, 25 (OH)2: <8 pg/mL
Vitamin D3 1, 25 (OH)2: 56 pg/mL

## 2023-01-07 DIAGNOSIS — F411 Generalized anxiety disorder: Secondary | ICD-10-CM | POA: Diagnosis not present

## 2023-01-10 ENCOUNTER — Other Ambulatory Visit: Payer: Self-pay

## 2023-01-17 DIAGNOSIS — F411 Generalized anxiety disorder: Secondary | ICD-10-CM | POA: Diagnosis not present

## 2023-01-25 ENCOUNTER — Other Ambulatory Visit: Payer: Self-pay | Admitting: Family Medicine

## 2023-01-25 ENCOUNTER — Other Ambulatory Visit: Payer: Self-pay | Admitting: Family

## 2023-01-25 ENCOUNTER — Other Ambulatory Visit: Payer: Self-pay

## 2023-01-25 MED ORDER — SYNTHROID 175 MCG PO TABS
175.0000 ug | ORAL_TABLET | Freq: Every day | ORAL | 0 refills | Status: DC
  Filled 2023-01-25: qty 30, 30d supply, fill #0
  Filled 2023-03-08: qty 30, 30d supply, fill #1
  Filled 2023-04-25: qty 30, 30d supply, fill #2

## 2023-01-27 ENCOUNTER — Other Ambulatory Visit: Payer: Self-pay

## 2023-01-27 MED ORDER — ATORVASTATIN CALCIUM 10 MG PO TABS
10.0000 mg | ORAL_TABLET | Freq: Every day | ORAL | 0 refills | Status: DC
  Filled 2023-01-27: qty 90, 90d supply, fill #0

## 2023-01-31 ENCOUNTER — Other Ambulatory Visit: Payer: Self-pay

## 2023-01-31 DIAGNOSIS — F411 Generalized anxiety disorder: Secondary | ICD-10-CM | POA: Diagnosis not present

## 2023-02-08 DIAGNOSIS — F411 Generalized anxiety disorder: Secondary | ICD-10-CM | POA: Diagnosis not present

## 2023-02-13 NOTE — Assessment & Plan Note (Signed)
hgba1c acceptable, minimize simple carbs. Increase exercise as tolerated.  

## 2023-02-13 NOTE — Assessment & Plan Note (Signed)
Is struggling with significant stressors including her husband's health as he is sick with cancer again

## 2023-02-13 NOTE — Progress Notes (Unsigned)
Subjective:    Patient ID: Katie Henry, female    DOB: [birth year], 52 y.o.   MRN: [id number]  No chief complaint on file.   HPI Discussed the use of AI scribe software for clinical note transcription with the patient, who gave verbal consent to proceed.  History of Present Illness     Patient is a 52 yo female in today for annual preventative exam and for follow up on chronic medical concerns. No recent febrile illness or hospitalizations. Denies CP/palp/SOB/HA/congestion/fevers/GI or GU c/o. Taking meds as prescribed        Past Medical History:  Diagnosis Date  . Anxiety   . Back pain   . Dental infection 05/28/2011  . Endometriosis   . Fainting episodes    Dr Morton Peters related  . Graves disease   . Grief reaction 11/15/2016  . History of kidney stones 2003   onset with pregnacy   . History of UTI    last 4-5-weeks ago  . Hyperglycemia 11/15/2016   cbg 100  . Hyperthyroidism   . 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 IBS (irritable bowel syndrome)   . Migraine   . Syncope   . Vitamin D deficiency     Past Surgical History:  Procedure Laterality Date  .  treatment  1998  . APPENDECTOMY  1991  . COLONOSCOPY  2020   [facility]  . CYSTOSCOPY/URETEROSCOPY/HOLMIUM LASER/STENT PLACEMENT Left 08/09/2017   Procedure: CYSTOSCOPY/RETROGRADE/URETEROSCOPY/HOLMIUM LASER/STENT PLACEMENT;  Surgeon: Jerilee Field, MD;  Location: [facility];  Service: Urology;  Laterality: Left;  ONLY NEEDS 60 MIN  . ENDOMETRIAL ABLATION  01/2007  . EXTRACORPOREAL SHOCK WAVE LITHOTRIPSY Left 07/21/2017   Procedure: LEFT EXTRACORPOREAL SHOCK WAVE LITHOTRIPSY (ESWL);  Surgeon: Alfredo Martinez, MD;  Location: [facility];  Service: Urology;  Laterality: Left;  Marland Kitchen VAGINAL HYSTERECTOMY  2010   ovaries still in place    Family History  Problem Relation Age of Onset  . Hyperlipidemia Mother   . Heart disease Mother        bicuspid mitral valve  . Hypertension Father   . Dementia Father    . Seizures Father        s/p TBI  . Graves' disease Sister   . Obesity Brother   . Heart attack Maternal Aunt        deceased age 45  . Heart disease Maternal Aunt   . Stomach cancer Paternal Uncle   . Hyperlipidemia Maternal Grandmother   . Diabetes Maternal Grandmother   . Kidney disease Maternal Grandmother   . Hyperlipidemia Maternal Grandfather   . Heart disease Maternal Grandfather 63       MI  . Prostate cancer Maternal Grandfather   . Hypertension Paternal Grandfather   . Prostate cancer Other        maternal and paternal grandparents  . Colon cancer Neg Hx   . Esophageal cancer Neg Hx   . Colon polyps Neg Hx   . Rectal cancer Neg Hx     Social History   Socioeconomic History  . Marital status: Married    Spouse name: Adayah Genson  . Number of children: 2  . Years of education: 65  . Highest education level: Not on file  Occupational History    Employer: [employer]  Tobacco Use  . Smoking status: Former    Current packs/day: 0.25    Average packs/day: 0.3 packs/day for 6.0 years (1.5 ttl pk-yrs)    Types: Cigarettes  .  Smokeless tobacco: Never  . Tobacco comments:    quit 2010  Vaping Use  . Vaping status: Never Used  Substance and Sexual Activity  . Alcohol use: Not Currently    Comment: Ocassionally  . Drug use: Never  . Sexual activity: Yes    Partners: Male    Birth control/protection: Post-menopausal    Comment: work at [employer], lives with fiance and son  Other Topics Concern  . Not on file  Social History Narrative   Engaged, two sons.   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   No Tob, occ alcohol, no drugs.   Social Determinants of Health   Financial Resource Strain: Not on file  Food Insecurity: Not on file  Transportation Needs: Not on file  Physical Activity: Not on file  Stress: Not on file  Social Connections: Not on file  Intimate Partner Violence: Not on file    Outpatient  Medications Prior to Visit  Medication Sig Dispense Refill  . acetaminophen (TYLENOL) 500 MG tablet Take 1,000 mg by mouth every 6 (six) hours as needed for mild pain or moderate pain.    Marland Kitchen ALPRAZolam (XANAX) 0.25 MG tablet Take 1 tablet (0.25 mg total) by mouth 2 (two) times daily as needed for anxiety. 30 tablet 1  . amLODipine (NORVASC) 5 MG tablet Take 1 tablet (5 mg total) by mouth daily. 90 tablet 1  . atorvastatin (LIPITOR) 10 MG tablet Take 1 tablet (10 mg total) by mouth daily. 90 tablet 0  . cetirizine (ZYRTEC) 10 MG tablet Take 1 tablet (10 mg total) by mouth daily. (Patient taking differently: Take 10 mg by mouth daily as needed.) 100 tablet 0  . cholecalciferol (VITAMIN D3) 25 MCG (1000 UT) tablet Take 5,000 Units by mouth daily.    . fluticasone (FLONASE) 50 MCG/ACT nasal spray Place 2 sprays into both nostrils daily. 16 g 6  . hydrochlorothiazide (MICROZIDE) 12.5 MG capsule Take 1 capsule (12.5 mg total) by mouth daily as needed. For BP>145/90 90 capsule 1  . meclizine (ANTIVERT) 50 MG tablet Take 1 tablet (50 mg total) by mouth 2 (two) times daily as needed for dizziness or nausea. 30 tablet 0  . Multiple Vitamins-Minerals (MULTIVITAMIN WITH MINERALS) tablet Take 1 tablet by mouth daily.    . promethazine (PHENERGAN) 12.5 MG tablet Take 1 tablet (12.5 mg total) by mouth every 8 (eight) hours as needed for nausea or vomiting. 15 tablet 0  . Semaglutide-Weight Management (WEGOVY) 0.25 MG/0.5ML SOAJ Inject 0.25 mg into the skin once a week. 6 mL 0  . SYNTHROID 175 MCG tablet Take 1 tablet (175 mcg total) by mouth daily before breakfast. 90 tablet 0  . valACYclovir (VALTREX) 1000 MG tablet Take 1 tablet (1,000 mg total) by mouth 2 (two) times daily as needed. 30 tablet 2  . Vitamin D, Ergocalciferol, (DRISDOL) 1.25 MG (50000 UT) CAPS capsule Take 1 capsule (50,000 Units total) by mouth every 7 (seven) days. 4 capsule 4   No facility-administered medications prior to visit.     Allergies  Allergen Reactions  . Zofran [Ondansetron Hcl] Other (See Comments)    Pt has hx of migraines; medication increases headaches and nausea   . Amoxicillin Nausea And Vomiting and Other (See Comments)    Has patient had a PCN reaction causing immediate rash, facial/tongue/throat swelling, SOB or lightheadedness with hypotension: Unknown Has patient had a PCN reaction causing severe rash involving mucus membranes or skin necrosis:  No Has patient had a PCN reaction that required hospitalization: No Has patient had a PCN reaction occurring within the last 10 years: No If all of the above answers are "NO", then may proceed with Cephalosporin use.   . Codeine Nausea And Vomiting  . Imitrex [Sumatriptan] Nausea And Vomiting  . Ultram [Tramadol Hcl] Nausea And Vomiting    Review of Systems  Constitutional:  Negative for chills, fever and malaise/fatigue.  HENT:  Negative for congestion and hearing loss.   Eyes:  Negative for discharge.  Respiratory:  Negative for cough, sputum production and shortness of breath.   Cardiovascular:  Negative for chest pain, palpitations and leg swelling.  Gastrointestinal:  Negative for abdominal pain, blood in stool, constipation, diarrhea, heartburn, nausea and vomiting.  Genitourinary:  Negative for dysuria, frequency, hematuria and urgency.  Musculoskeletal:  Negative for back pain, falls and myalgias.  Skin:  Negative for rash.  Neurological:  Negative for dizziness, sensory change, loss of consciousness, weakness and headaches.  Endo/Heme/Allergies:  Negative for environmental allergies. Does not bruise/bleed easily.  Psychiatric/Behavioral:  Negative for depression and suicidal ideas. The patient is nervous/anxious. The patient does not have insomnia.       Objective:    Physical Exam Constitutional:      General: She is not in acute distress.    Appearance: Normal appearance. She is not diaphoretic.  HENT:     Head: Normocephalic  and atraumatic.     Right Ear: Tympanic membrane, ear canal and external ear normal.     Left Ear: Tympanic membrane, ear canal and external ear normal.     Nose: Nose normal.     Mouth/Throat:     Mouth: Mucous membranes are moist.     Pharynx: Oropharynx is clear. No oropharyngeal exudate.  Eyes:     General: No scleral icterus.       Right eye: No discharge.        Left eye: No discharge.     Conjunctiva/sclera: Conjunctivae normal.     Pupils: Pupils are equal, round, and reactive to light.  Neck:     Thyroid: No thyromegaly.  Cardiovascular:     Rate and Rhythm: Normal rate and regular rhythm.     Heart sounds: Normal heart sounds. No murmur heard. Pulmonary:     Effort: Pulmonary effort is normal. No respiratory distress.     Breath sounds: Normal breath sounds. No wheezing or rales.  Abdominal:     General: Bowel sounds are normal. There is no distension.     Palpations: Abdomen is soft. There is no mass.     Tenderness: There is no abdominal tenderness.  Musculoskeletal:        General: No tenderness. Normal range of motion.     Cervical back: Normal range of motion and neck supple.  Lymphadenopathy:     Cervical: No cervical adenopathy.  Skin:    General: Skin is warm and dry.     Findings: No rash.  Neurological:     General: No focal deficit present.     Mental Status: She is alert and oriented to person, place, and time.     Cranial Nerves: No cranial nerve deficit.     Coordination: Coordination normal.     Deep Tendon Reflexes: Reflexes are normal and symmetric. Reflexes normal.  Psychiatric:        Mood and Affect: Mood normal.        Behavior: Behavior normal.  Thought Content: Thought content normal.        Judgment: Judgment normal.   There were no vitals taken for this visit. Wt Readings from Last 3 Encounters:  05/17/22 190 lb (86.2 kg)  02/22/22 196 lb (88.9 kg)  02/09/22 190 lb (86.2 kg)    Diabetic Foot Exam - Simple   No data filed     Lab Results  Component Value Date   WBC 6.5 12/22/2022   HGB 14.3 12/22/2022   HCT 43.1 12/22/2022   PLT 449.0 (H) 12/22/2022   GLUCOSE 88 12/22/2022   CHOL 162 12/22/2022   TRIG 116.0 12/22/2022   HDL 49.40 12/22/2022   LDLDIRECT 167.0 07/24/2019   LDLCALC 89 12/22/2022   ALT 15 12/22/2022   AST 12 12/22/2022   NA 138 12/22/2022   K 4.2 12/22/2022   CL 104 12/22/2022   CREATININE 0.65 12/22/2022   BUN 11 12/22/2022   CO2 22 12/22/2022   TSH 3.21 12/22/2022   HGBA1C 4.7 12/22/2022    Lab Results  Component Value Date   TSH 3.21 12/22/2022   Lab Results  Component Value Date   WBC 6.5 12/22/2022   HGB 14.3 12/22/2022   HCT 43.1 12/22/2022   MCV 92.7 12/22/2022   PLT 449.0 (H) 12/22/2022   Lab Results  Component Value Date   NA 138 12/22/2022   K 4.2 12/22/2022   CO2 22 12/22/2022   GLUCOSE 88 12/22/2022   BUN 11 12/22/2022   CREATININE 0.65 12/22/2022   BILITOT 0.5 12/22/2022   ALKPHOS 64 12/22/2022   AST 12 12/22/2022   ALT 15 12/22/2022   PROT 7.1 12/22/2022   ALBUMIN 4.0 12/22/2022   CALCIUM 9.6 12/22/2022   ANIONGAP 10 11/25/2019   GFR 101.56 12/22/2022   Lab Results  Component Value Date   CHOL 162 12/22/2022   Lab Results  Component Value Date   HDL 49.40 12/22/2022   Lab Results  Component Value Date   LDLCALC 89 12/22/2022   Lab Results  Component Value Date   TRIG 116.0 12/22/2022   Lab Results  Component Value Date   CHOLHDL 3 12/22/2022   Lab Results  Component Value Date   HGBA1C 4.7 12/22/2022       Assessment & Plan:  Anxiety and depression Assessment & Plan: Is struggling with significant stressors including her husband's health as he is sick with cancer again   Essential hypertension Assessment & Plan: Well controlled, no changes to meds. Encouraged heart healthy diet such as the DASH diet and exercise as tolerated.     Hyperglycemia Assessment & Plan: hgba1c acceptable, minimize simple carbs. Increase  exercise as tolerated.    Hyperlipidemia, unspecified hyperlipidemia type Assessment & Plan: Tolerating statin, encouraged heart healthy diet, avoid trans fats, minimize simple carbs and saturated fats. Increase exercise as tolerated   Other specified hypothyroidism Assessment & Plan: On Levothyroxine, continue to monitor    Preventative health care Assessment & Plan: Patient encouraged to maintain heart healthy diet, regular exercise, adequate sleep. Consider daily probiotics. Take medications as prescribed. Labs ordered and reviewed. Pap due she will proceed with OB/GYN. Colonoscopy 12/2018 is proceeding with repeat colonoscopy next month. MGM March 2023 repeat this year Colonoscopy August 2023 repeat in 5 years Pap       Assessment and Plan              Danise Edge, MD

## 2023-02-13 NOTE — Assessment & Plan Note (Signed)
Tolerating statin, encouraged heart healthy diet, avoid trans fats, minimize simple carbs and saturated fats. Increase exercise as tolerated 

## 2023-02-13 NOTE — Assessment & Plan Note (Signed)
Well controlled, no changes to meds. Encouraged heart healthy diet such as the DASH diet and exercise as tolerated.  °

## 2023-02-13 NOTE — Assessment & Plan Note (Signed)
On Levothyroxine, continue to monitor 

## 2023-02-13 NOTE — Assessment & Plan Note (Signed)
Patient encouraged to maintain heart healthy diet, regular exercise, adequate sleep. Consider daily probiotics. Take medications as prescribed. Labs ordered and reviewed. Pap due she will proceed with OB/GYN. Colonoscopy 12/2018 is proceeding with repeat colonoscopy next month. MGM March 2023 repeat this year Colonoscopy August 2023 repeat in 5 years Pap, had a partial hysterectomy for endometriosis, will f/u OB/GYN

## 2023-02-14 ENCOUNTER — Other Ambulatory Visit: Payer: Self-pay

## 2023-02-14 ENCOUNTER — Ambulatory Visit (INDEPENDENT_AMBULATORY_CARE_PROVIDER_SITE_OTHER): Payer: 59 | Admitting: Family Medicine

## 2023-02-14 ENCOUNTER — Encounter: Payer: Self-pay | Admitting: Family Medicine

## 2023-02-14 VITALS — BP 126/74 | HR 73 | Temp 98.0°F | Resp 16 | Ht 63.0 in | Wt 183.0 lb

## 2023-02-14 DIAGNOSIS — R739 Hyperglycemia, unspecified: Secondary | ICD-10-CM

## 2023-02-14 DIAGNOSIS — E038 Other specified hypothyroidism: Secondary | ICD-10-CM

## 2023-02-14 DIAGNOSIS — I10 Essential (primary) hypertension: Secondary | ICD-10-CM | POA: Diagnosis not present

## 2023-02-14 DIAGNOSIS — F419 Anxiety disorder, unspecified: Secondary | ICD-10-CM | POA: Diagnosis not present

## 2023-02-14 DIAGNOSIS — F32A Depression, unspecified: Secondary | ICD-10-CM | POA: Diagnosis not present

## 2023-02-14 DIAGNOSIS — Z01419 Encounter for gynecological examination (general) (routine) without abnormal findings: Secondary | ICD-10-CM

## 2023-02-14 DIAGNOSIS — E785 Hyperlipidemia, unspecified: Secondary | ICD-10-CM

## 2023-02-14 DIAGNOSIS — Z0000 Encounter for general adult medical examination without abnormal findings: Secondary | ICD-10-CM

## 2023-02-14 DIAGNOSIS — Z1231 Encounter for screening mammogram for malignant neoplasm of breast: Secondary | ICD-10-CM

## 2023-02-14 MED ORDER — ALPRAZOLAM 0.25 MG PO TABS
0.2500 mg | ORAL_TABLET | Freq: Two times a day (BID) | ORAL | 2 refills | Status: DC | PRN
  Filled 2023-02-14: qty 30, 15d supply, fill #0
  Filled 2023-04-25: qty 30, 15d supply, fill #1
  Filled 2023-08-11: qty 30, 15d supply, fill #2

## 2023-02-14 MED ORDER — WEGOVY 0.5 MG/0.5ML ~~LOC~~ SOAJ: 0.5000 mg | SUBCUTANEOUS | Status: DC

## 2023-02-14 NOTE — Patient Instructions (Addendum)
Shingrix is the new shingles shot, 2 shots over 2-6 months, confirm coverage with insurance and document, then can return here for shots with nurse appt or at pharmacy  [employer], [employer] for clean products  Co Q 10 enzyme, aged or blackened garlic, fish or krill oild   Preventive Care 75-52 Years Old, Female Preventive care refers to lifestyle choices and visits with your health care provider that can promote health and wellness. Preventive care visits are also called wellness exams. What can I expect for my preventive care visit? Counseling Your health care provider may ask you questions about your: Medical history, including: Past medical problems. Family medical history. Pregnancy history. Current health, including: Menstrual cycle. Method of birth control. Emotional well-being. Home life and relationship well-being. Sexual activity and sexual health. Lifestyle, including: Alcohol, nicotine or tobacco, and drug use. Access to firearms. Diet, exercise, and sleep habits. Work and work Astronomer. Sunscreen use. Safety issues such as seatbelt and bike helmet use. Physical exam Your health care provider will check your: Height and weight. These may be used to calculate your BMI (body mass index). BMI is a measurement that tells if you are at a healthy weight. Waist circumference. This measures the distance around your waistline. This measurement also tells if you are at a healthy weight and may help predict your risk of certain diseases, such as type 2 diabetes and high blood pressure. Heart rate and blood pressure. Body temperature. Skin for abnormal spots. What immunizations do I need?  Vaccines are usually given at various ages, according to a schedule. Your health care provider will recommend vaccines for you based on your age, medical history, and lifestyle or other factors, such as travel or where you work. What tests do I need? Screening Your health care  provider may recommend screening tests for certain conditions. This may include: Lipid and cholesterol levels. Diabetes screening. This is done by checking your blood sugar (glucose) after you have not eaten for a while (fasting). Pelvic exam and Pap test. Hepatitis B test. Hepatitis C test. HIV (human immunodeficiency virus) test. STI (sexually transmitted infection) testing, if you are at risk. Lung cancer screening. Colorectal cancer screening. Mammogram. Talk with your health care provider about when you should start having regular mammograms. This may depend on whether you have a family history of breast cancer. BRCA-related cancer screening. This may be done if you have a family history of breast, ovarian, tubal, or peritoneal cancers. Bone density scan. This is done to screen for osteoporosis. Talk with your health care provider about your test results, treatment options, and if necessary, the need for more tests. Follow these instructions at home: Eating and drinking  Eat a diet that includes fresh fruits and vegetables, whole grains, lean protein, and low-fat dairy products. Take vitamin and mineral supplements as recommended by your health care provider. Do not drink alcohol if: Your health care provider tells you not to drink. You are pregnant, may be pregnant, or are planning to become pregnant. If you drink alcohol: Limit how much you have to 0-1 drink a day. Know how much alcohol is in your drink. In the [place] one drink equals one 12 oz bottle of beer (355 mL), one 5 oz glass of wine (148 mL), or one 1 oz glass of hard liquor (44 mL). Lifestyle Brush your teeth every morning and night with fluoride toothpaste. Floss one time each day. Exercise for at least 30 minutes 5 or more days each week. Do  not use any products that contain nicotine or tobacco. These products include cigarettes, chewing tobacco, and vaping devices, such as e-cigarettes. If you need help quitting, ask  your health care provider. Do not use drugs. If you are sexually active, practice safe sex. Use a condom or other form of protection to prevent STIs. If you do not wish to become pregnant, use a form of birth control. If you plan to become pregnant, see your health care provider for a prepregnancy visit. Take aspirin only as told by your health care provider. Make sure that you understand how much to take and what form to take. Work with your health care provider to find out whether it is safe and beneficial for you to take aspirin daily. Find healthy ways to manage stress, such as: Meditation, yoga, or listening to music. Journaling. Talking to a trusted person. Spending time with friends and family. Minimize exposure to UV radiation to reduce your risk of skin cancer. Safety Always wear your seat belt while driving or riding in a vehicle. Do not drive: If you have been drinking alcohol. Do not ride with someone who has been drinking. When you are tired or distracted. While texting. If you have been using any mind-altering substances or drugs. Wear a helmet and other protective equipment during sports activities. If you have firearms in your house, make sure you follow all gun safety procedures. Seek help if you have been physically or sexually abused. What's next? Visit your health care provider once a year for an annual wellness visit. Ask your health care provider how often you should have your eyes and teeth checked. Stay up to date on all vaccines. This information is not intended to replace advice given to you by your health care provider. Make sure you discuss any questions you have with your health care provider. Document Revised: 12/31/2020 Document Reviewed: 12/31/2020 Elsevier Patient Education  2024 [employer].

## 2023-02-15 ENCOUNTER — Other Ambulatory Visit: Payer: Self-pay

## 2023-02-18 ENCOUNTER — Other Ambulatory Visit: Payer: Self-pay

## 2023-02-18 MED ORDER — ZOSTER VAC RECOMB ADJUVANTED 50 MCG/0.5ML IM SUSR
0.5000 mL | Freq: Once | INTRAMUSCULAR | 0 refills | Status: AC
  Filled 2023-02-18: qty 0.5, 1d supply, fill #0

## 2023-02-25 DIAGNOSIS — F411 Generalized anxiety disorder: Secondary | ICD-10-CM | POA: Diagnosis not present

## 2023-03-01 ENCOUNTER — Ambulatory Visit (HOSPITAL_BASED_OUTPATIENT_CLINIC_OR_DEPARTMENT_OTHER)
Admission: RE | Admit: 2023-03-01 | Discharge: 2023-03-01 | Disposition: A | Discharge status: Home / Self Care | Payer: 59 | Source: Ambulatory Visit | Attending: Family Medicine | Admitting: Family Medicine

## 2023-03-01 ENCOUNTER — Encounter (HOSPITAL_BASED_OUTPATIENT_CLINIC_OR_DEPARTMENT_OTHER): Payer: Self-pay

## 2023-03-01 DIAGNOSIS — Z1231 Encounter for screening mammogram for malignant neoplasm of breast: Secondary | ICD-10-CM | POA: Diagnosis not present

## 2023-03-08 ENCOUNTER — Other Ambulatory Visit: Payer: Self-pay | Admitting: Family Medicine

## 2023-03-08 ENCOUNTER — Other Ambulatory Visit: Payer: Self-pay

## 2023-03-09 ENCOUNTER — Other Ambulatory Visit: Payer: Self-pay

## 2023-03-09 MED ORDER — AMLODIPINE BESYLATE 5 MG PO TABS
5.0000 mg | ORAL_TABLET | Freq: Every day | ORAL | 1 refills | Status: DC
  Filled 2023-03-09: qty 90, 90d supply, fill #0
  Filled 2023-04-25 – 2023-06-08 (×2): qty 90, 90d supply, fill #1

## 2023-03-10 ENCOUNTER — Telehealth: Payer: Self-pay

## 2023-03-10 NOTE — Telephone Encounter (Signed)
Called patient to schedule new patient appointment. Left voicemail with our contact information to call back and schedule.  

## 2023-03-11 DIAGNOSIS — F411 Generalized anxiety disorder: Secondary | ICD-10-CM | POA: Diagnosis not present

## 2023-03-25 DIAGNOSIS — F411 Generalized anxiety disorder: Secondary | ICD-10-CM | POA: Diagnosis not present

## 2023-04-25 ENCOUNTER — Other Ambulatory Visit: Payer: Self-pay | Admitting: Family Medicine

## 2023-04-25 ENCOUNTER — Other Ambulatory Visit: Payer: Self-pay

## 2023-04-26 ENCOUNTER — Other Ambulatory Visit: Payer: Self-pay

## 2023-04-26 MED ORDER — ATORVASTATIN CALCIUM 10 MG PO TABS
10.0000 mg | ORAL_TABLET | Freq: Every day | ORAL | 0 refills | Status: DC
  Filled 2023-04-26: qty 90, 90d supply, fill #0

## 2023-05-06 DIAGNOSIS — F411 Generalized anxiety disorder: Secondary | ICD-10-CM | POA: Diagnosis not present

## 2023-05-19 ENCOUNTER — Ambulatory Visit: Payer: 59 | Admitting: Family Medicine

## 2023-05-25 DIAGNOSIS — F411 Generalized anxiety disorder: Secondary | ICD-10-CM | POA: Diagnosis not present

## 2023-06-02 ENCOUNTER — Other Ambulatory Visit: Payer: Self-pay

## 2023-06-02 MED ORDER — ZOSTER VAC RECOMB ADJUVANTED 50 MCG/0.5ML IM SUSR
0.5000 mL | Freq: Once | INTRAMUSCULAR | 0 refills | Status: AC
  Filled 2023-06-02: qty 0.5, 1d supply, fill #0

## 2023-06-08 ENCOUNTER — Other Ambulatory Visit: Payer: Self-pay | Admitting: Family Medicine

## 2023-06-08 ENCOUNTER — Other Ambulatory Visit: Payer: Self-pay

## 2023-06-08 MED ORDER — SYNTHROID 175 MCG PO TABS
175.0000 ug | ORAL_TABLET | Freq: Every day | ORAL | 2 refills | Status: DC
  Filled 2023-06-08: qty 90, 90d supply, fill #0
  Filled 2023-06-13: qty 30, 30d supply, fill #0
  Filled 2023-08-11: qty 30, 30d supply, fill #1
  Filled 2023-09-28: qty 30, 30d supply, fill #2
  Filled 2023-11-30: qty 30, 30d supply, fill #3
  Filled 2024-01-13: qty 30, 30d supply, fill #4

## 2023-06-13 ENCOUNTER — Other Ambulatory Visit: Payer: Self-pay

## 2023-06-29 DIAGNOSIS — F411 Generalized anxiety disorder: Secondary | ICD-10-CM | POA: Diagnosis not present

## 2023-07-15 DIAGNOSIS — F411 Generalized anxiety disorder: Secondary | ICD-10-CM | POA: Diagnosis not present

## 2023-08-10 DIAGNOSIS — F411 Generalized anxiety disorder: Secondary | ICD-10-CM | POA: Diagnosis not present

## 2023-08-11 ENCOUNTER — Other Ambulatory Visit: Payer: Self-pay

## 2023-08-12 ENCOUNTER — Other Ambulatory Visit: Payer: Self-pay

## 2023-09-16 ENCOUNTER — Other Ambulatory Visit: Payer: Self-pay

## 2023-09-16 MED ORDER — PREDNISONE 10 MG (21) PO TBPK
ORAL_TABLET | ORAL | 0 refills | Status: AC
  Filled 2023-09-16: qty 21, 6d supply, fill #0

## 2023-09-20 DIAGNOSIS — F411 Generalized anxiety disorder: Secondary | ICD-10-CM | POA: Diagnosis not present

## 2023-09-22 ENCOUNTER — Other Ambulatory Visit: Payer: Self-pay

## 2023-09-28 ENCOUNTER — Other Ambulatory Visit: Payer: Self-pay | Admitting: Family Medicine

## 2023-09-28 ENCOUNTER — Other Ambulatory Visit: Payer: Self-pay | Admitting: Family

## 2023-09-28 ENCOUNTER — Other Ambulatory Visit: Payer: Self-pay

## 2023-09-28 DIAGNOSIS — J0100 Acute maxillary sinusitis, unspecified: Secondary | ICD-10-CM

## 2023-09-28 MED ORDER — FLUTICASONE PROPIONATE 50 MCG/ACT NA SUSP
2.0000 | Freq: Every day | NASAL | 6 refills | Status: AC
  Filled 2023-09-28: qty 16, 30d supply, fill #0

## 2023-09-28 MED ORDER — HYDROCHLOROTHIAZIDE 12.5 MG PO CAPS
12.5000 mg | ORAL_CAPSULE | Freq: Every day | ORAL | 1 refills | Status: AC | PRN
  Filled 2023-09-28: qty 90, 90d supply, fill #0
  Filled 2024-01-13: qty 90, 90d supply, fill #1

## 2023-09-28 MED ORDER — CETIRIZINE HCL 10 MG PO TABS
10.0000 mg | ORAL_TABLET | Freq: Every day | ORAL | 1 refills | Status: AC
  Filled 2023-09-28: qty 90, 90d supply, fill #0

## 2023-09-28 MED ORDER — ATORVASTATIN CALCIUM 10 MG PO TABS
10.0000 mg | ORAL_TABLET | Freq: Every day | ORAL | 0 refills | Status: DC
  Filled 2023-09-28: qty 90, 90d supply, fill #0

## 2023-09-28 MED ORDER — AMLODIPINE BESYLATE 5 MG PO TABS
5.0000 mg | ORAL_TABLET | Freq: Every day | ORAL | 1 refills | Status: DC
  Filled 2023-09-28: qty 90, 90d supply, fill #0
  Filled 2024-01-13: qty 90, 90d supply, fill #1

## 2023-09-29 ENCOUNTER — Other Ambulatory Visit: Payer: Self-pay

## 2023-10-03 ENCOUNTER — Other Ambulatory Visit: Payer: Self-pay

## 2023-10-03 ENCOUNTER — Telehealth: Admitting: Family Medicine

## 2023-10-03 DIAGNOSIS — M545 Low back pain, unspecified: Secondary | ICD-10-CM

## 2023-10-03 MED ORDER — CYCLOBENZAPRINE HCL 10 MG PO TABS
10.0000 mg | ORAL_TABLET | Freq: Three times a day (TID) | ORAL | 0 refills | Status: DC | PRN
  Filled 2023-10-03: qty 30, 10d supply, fill #0

## 2023-10-03 MED ORDER — NAPROXEN 500 MG PO TABS
500.0000 mg | ORAL_TABLET | Freq: Two times a day (BID) | ORAL | 0 refills | Status: DC
Start: 2023-10-03 — End: 2024-03-02
  Filled 2023-10-03: qty 30, 15d supply, fill #0

## 2023-10-03 NOTE — Progress Notes (Signed)
E-Visit for Back Pain   We are sorry that you are not feeling well.  Here is how we plan to help!  Based on what you have shared with me it looks like you mostly have acute back pain.  Acute back pain is defined as musculoskeletal pain that can resolve in 1-3 weeks with conservative treatment.  I have prescribed Naprosyn 500 mg take one by mouth twice a day non-steroid anti-inflammatory (NSAID) as well as Flexeril 10 mg every eight hours as needed which is a muscle relaxer  Some patients experience stomach irritation or in increased heartburn with anti-inflammatory drugs.  Please keep in mind that muscle relaxer's can cause fatigue and should not be taken while at work or driving.  Back pain is very common.  The pain often gets better over time.  The cause of back pain is usually not dangerous.  Most people can learn to manage their back pain on their own.  Home Care Stay active.  Start with short walks on flat ground if you can.  Try to walk farther each day. Do not sit, drive or stand in one place for more than 30 minutes.  Do not stay in bed. Do not avoid exercise or work.  Activity can help your back heal faster. Be careful when you bend or lift an object.  Bend at your knees, keep the object close to you, and do not twist. Sleep on a firm mattress.  Lie on your side, and bend your knees.  If you lie on your back, put a pillow under your knees. Only take medicines as told by your doctor. Put ice on the injured area. Put ice in a plastic bag Place a towel between your skin and the bag Leave the ice on for 15-20 minutes, 3-4 times a day for the first 2-3 days. 210 After that, you can switch between ice and heat packs. Ask your doctor about back exercises or massage. Avoid feeling anxious or stressed.  Find good ways to deal with stress, such as exercise.  Get Help Right Way If: Your pain does not go away with rest or medicine. Your pain does not go away in 1 week. You have new  problems. You do not feel well. The pain spreads into your legs. You cannot control when you poop (bowel movement) or pee (urinate) You feel sick to your stomach (nauseous) or throw up (vomit) You have belly (abdominal) pain. You feel like you may pass out (faint). If you develop a fever.  Make Sure you: Understand these instructions. Will watch your condition Will get help right away if you are not doing well or get worse.  Your e-visit answers were reviewed by a board certified advanced clinical practitioner to complete your personal care plan.  Depending on the condition, your plan could have included both over the counter or prescription medications.  If there is a problem please reply  once you have received a response from your provider.  Your safety is important to [place].  If you have drug allergies check your prescription carefully.    You can use MyChart to ask questions about today's visit, request a non-urgent call back, or ask for a work or school excuse for 24 hours related to this e-Visit. If it has been greater than 24 hours you will need to follow up with your provider, or enter a new e-Visit to address those concerns.  You will get an e-mail in the next two days asking about  your experience.  I hope that your e-visit has been valuable and will speed your recovery. Thank you for using e-visits.  I provided 5 minutes of non face-to-face time during this encounter for chart review, medication and order placement, as well as and documentation.

## 2023-10-21 DIAGNOSIS — F331 Major depressive disorder, recurrent, moderate: Secondary | ICD-10-CM | POA: Diagnosis not present

## 2023-10-27 ENCOUNTER — Other Ambulatory Visit: Payer: Self-pay

## 2023-10-27 ENCOUNTER — Ambulatory Visit (HOSPITAL_BASED_OUTPATIENT_CLINIC_OR_DEPARTMENT_OTHER)
Admission: RE | Admit: 2023-10-27 | Discharge: 2023-10-27 | Disposition: A | Discharge status: Home / Self Care | Source: Ambulatory Visit | Attending: Family | Admitting: Family

## 2023-10-27 ENCOUNTER — Encounter: Payer: Self-pay | Admitting: Family

## 2023-10-27 ENCOUNTER — Ambulatory Visit: Admitting: Family

## 2023-10-27 ENCOUNTER — Ambulatory Visit: Payer: Self-pay

## 2023-10-27 VITALS — BP 120/82 | HR 82 | Temp 98.0°F | Resp 16 | Ht 63.0 in | Wt 183.0 lb

## 2023-10-27 DIAGNOSIS — M545 Low back pain, unspecified: Secondary | ICD-10-CM | POA: Insufficient documentation

## 2023-10-27 DIAGNOSIS — M5136 Other intervertebral disc degeneration, lumbar region with discogenic back pain only: Secondary | ICD-10-CM | POA: Diagnosis not present

## 2023-10-27 DIAGNOSIS — M25551 Pain in right hip: Secondary | ICD-10-CM | POA: Diagnosis not present

## 2023-10-27 DIAGNOSIS — I878 Other specified disorders of veins: Secondary | ICD-10-CM | POA: Diagnosis not present

## 2023-10-27 DIAGNOSIS — M4317 Spondylolisthesis, lumbosacral region: Secondary | ICD-10-CM | POA: Diagnosis not present

## 2023-10-27 DIAGNOSIS — G8929 Other chronic pain: Secondary | ICD-10-CM

## 2023-10-27 DIAGNOSIS — M1611 Unilateral primary osteoarthritis, right hip: Secondary | ICD-10-CM | POA: Diagnosis not present

## 2023-10-27 DIAGNOSIS — M48061 Spinal stenosis, lumbar region without neurogenic claudication: Secondary | ICD-10-CM | POA: Diagnosis not present

## 2023-10-27 MED ORDER — METHYLPREDNISOLONE 4 MG PO TBPK
ORAL_TABLET | ORAL | 0 refills | Status: AC
  Filled 2023-10-27: qty 21, 6d supply, fill #0

## 2023-10-27 MED ORDER — TIZANIDINE HCL 2 MG PO CAPS
2.0000 mg | ORAL_CAPSULE | Freq: Three times a day (TID) | ORAL | 0 refills | Status: DC | PRN
  Filled 2023-10-27: qty 30, 10d supply, fill #0
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
## 2023-10-27 NOTE — Telephone Encounter (Signed)
 Copied from CRM [id number]. Topic: Clinical - Red Word Triage >> Oct 27, 2023  9:05 AM Geroge Baseman wrote: Red Word that prompted transfer to Nurse Triage: Patient is having severe pain in her lower back  Chief Complaint: Lower back pain Symptoms: Pain Frequency: Today Pertinent Negatives: Patient denies numbness/tingling Disposition: [] ED /[] Urgent Care (no appt availability in office) / [x] Appointment(In office/virtual)/ []  [facility]/ [] Home Care/ [] Refused Recommended Disposition /[] [facility]/ []  Follow-up with PCP Additional Notes: Patient called in to report exacerbation of lower back pain. 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 Patient rated pain 3-4 at this time, but stated it typically progresses. Patient denied neurological symptoms. Advised patient to be seen in office. Patient requested appointment today. No availability with PCP. Scheduled same day appointment with alternate provider in office. Provided care advice and instructed patient to call back if symptoms worsen. Patient complied.   Reason for Disposition  Back pain is a chronic symptom (recurrent or ongoing AND present > 4 weeks)  Answer Assessment - Initial Assessment Questions 1. ONSET: "When did the pain begin?"      This morning and progressing 2. LOCATION: "Where does it hurt?" (upper, mid or lower back)     Right lower back, pain radiating down- states it feels like she pulled a muscle 3. SEVERITY: "How bad is the pain?"  (e.g., Scale 1-10; mild, moderate, or severe)   - MILD (1-3): Doesn't interfere with normal activities.    - MODERATE (4-7): Interferes with normal activities or awakens from sleep.    - SEVERE (8-10): Excruciating pain, unable to do any normal activities.      Rates pain 3-4 4. PATTERN: "Is the pain constant?" (e.g., yes, no; constant, intermittent)      States pain is constant upon movement  5. RADIATION: "Does the pain  shoot into your legs or somewhere else?"     States pain radiates down towards buttocks 6. CAUSE:  "What do you think is causing the back pain?"      Overuse 7. 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 MEDICINES: "What have you taken so far for the pain?" (e.g., nothing, acetaminophen, NSAIDS)     Motrin 9. NEUROLOGIC SYMPTOMS: "Do you have any weakness, numbness, or problems with bowel/bladder control?"     Denies 10. OTHER SYMPTOMS: "Do you have any other symptoms?" (e.g., fever, abdomen pain, burning with urination, blood in urine)       Denies  Protocols used: Back Pain-A-AH

## 2023-10-31 ENCOUNTER — Encounter: Payer: Self-pay | Admitting: Family

## 2023-12-02 ENCOUNTER — Other Ambulatory Visit: Payer: Self-pay

## 2023-12-17 ENCOUNTER — Encounter: Payer: Self-pay | Admitting: Family

## 2023-12-19 ENCOUNTER — Other Ambulatory Visit: Payer: Self-pay | Admitting: Family

## 2023-12-19 DIAGNOSIS — G8929 Other chronic pain: Secondary | ICD-10-CM

## 2024-01-13 ENCOUNTER — Other Ambulatory Visit: Payer: Self-pay

## 2024-01-13 ENCOUNTER — Other Ambulatory Visit: Payer: Self-pay | Admitting: Family Medicine

## 2024-01-13 MED ORDER — ATORVASTATIN CALCIUM 10 MG PO TABS
10.0000 mg | ORAL_TABLET | Freq: Every day | ORAL | 0 refills | Status: DC
  Filled 2024-01-13: qty 90, 90d supply, fill #0

## 2024-01-13 MED ORDER — VALACYCLOVIR HCL 1 G PO TABS
1000.0000 mg | ORAL_TABLET | Freq: Two times a day (BID) | ORAL | 0 refills | Status: AC | PRN
  Filled 2024-01-13: qty 90, 45d supply, fill #0

## 2024-01-13 MED ORDER — ALPRAZOLAM 0.25 MG PO TABS
0.2500 mg | ORAL_TABLET | Freq: Two times a day (BID) | ORAL | 2 refills | Status: AC | PRN
  Filled 2024-01-13: qty 30, 15d supply, fill #0
  Filled 2024-06-24: qty 30, 15d supply, fill #1

## 2024-01-13 NOTE — Telephone Encounter (Signed)
 Requesting: alprazolam  0.25mg   Contract: None UDS: None Last Visit: 10/27/23 Next Visit: 02/16/24 Last Refill: 02/14/23 #30 and 0RF  Please Advise

## 2024-01-19 ENCOUNTER — Other Ambulatory Visit: Payer: Self-pay

## 2024-01-24 DIAGNOSIS — F411 Generalized anxiety disorder: Secondary | ICD-10-CM | POA: Diagnosis not present

## 2024-02-16 ENCOUNTER — Encounter: Payer: 59 | Admitting: Family Medicine

## 2024-02-24 DIAGNOSIS — M5431 Sciatica, right side: Secondary | ICD-10-CM | POA: Diagnosis not present

## 2024-02-24 DIAGNOSIS — Z6833 Body mass index (BMI) 33.0-33.9, adult: Secondary | ICD-10-CM | POA: Diagnosis not present

## 2024-02-26 NOTE — Assessment & Plan Note (Signed)
 Tolerating statin, encouraged heart healthy diet, avoid trans fats, minimize simple carbs and saturated fats. Increase exercise as tolerated

## 2024-02-26 NOTE — Assessment & Plan Note (Signed)
 Supplement and monitor

## 2024-02-26 NOTE — Progress Notes (Addendum)
 Subjective:     Patient ID: Katie Henry, female    DOB: 21-Feb-1971, 53 y.o.   MRN: [id number]  Chief Complaint  Patient presents with   Annual Exam    HPI   Katie Henry a 53 y.o.  female presents today for annual physical exam. Pt reports consuming a general diet. Pt generally feels fairly well. Reports sleeping poorly.  Patient reports her husband passed away 17-May-2025colon cancer.  She was providing care for him for quite some time.  Patient reports currently being in counseling, but expresses interest in additional counseling focused on grief. New Surgeries or Family Hx: Denies   Anxiety and depression-follows with behavioral health/psychotherapy Alprazolam  0.25 mg twice daily as needed for anxiety Taking sparingly  HTN Amlodipine  5 mg daily, HCTZ 12.5 mg as needed for BP greater than 145/90  Hypothyroidism-Synthroid  175 mcg daily  Allergies-on cetirizine  (Zyrtec ) 10 mg daily, Flonase   Vitamin D  deficiency-vitamin D3 5000 units daily  Chronic low back pain-she was recently referred to neurosurgery 12/19/2023 Scheduled for MRI next week.  Patient denies fever, chills, SOB, CP, palpitations, dyspnea, edema, HA, vision changes, N/V/D, abdominal pain, urinary symptoms, rash, weight changes, and recent illness or hospitalizations.      History of Present Illness              Health Maintenance Due  Topic Date Due   Hepatitis B Vaccines 19-59 Average Risk (1 of 3 - 19+ 3-dose series) Never done   Pneumococcal Vaccine: 50+ Years (1 of 1 - PCV) Never done    Past Medical History:  Diagnosis Date   Anxiety    Back pain    Dental infection 05/28/2011   Endometriosis    Fainting episodes    Dr Lorenza related   Graves disease    Grief reaction 11/15/2016   History of kidney stones 2003   onset with pregnacy    History of UTI    last 4-5-weeks ago   Hyperglycemia 11/15/2016   cbg 100   Hypertension 2023   Taking medicine   Hyperthyroidism     Hypothyroidism    hypo after radioactibe iodine   IBS (irritable bowel syndrome)    Migraine    Syncope    Vitamin D  deficiency     Past Surgical History:  Procedure Laterality Date    treatment  1998   ABDOMINAL HYSTERECTOMY  2010   Partical   APPENDECTOMY  1991   COLONOSCOPY  2020   ssp   CYSTOSCOPY/URETEROSCOPY/HOLMIUM LASER/STENT PLACEMENT Left 08/09/2017   Procedure: CYSTOSCOPY/RETROGRADE/URETEROSCOPY/HOLMIUM LASER/STENT PLACEMENT;  Surgeon: Nieves Cough, MD;  Location: [facility];  Service: Urology;  Laterality: Left;  ONLY NEEDS 60 MIN   ENDOMETRIAL ABLATION  01/2007   EXTRACORPOREAL SHOCK WAVE LITHOTRIPSY Left 07/21/2017   Procedure: LEFT EXTRACORPOREAL SHOCK WAVE LITHOTRIPSY (ESWL);  Surgeon: Gaston Hamilton, MD;  Location: [facility];  Service: Urology;  Laterality: Left;   VAGINAL HYSTERECTOMY  2010   ovaries still in place    Family History  Problem Relation Age of Onset   Hyperlipidemia Mother    Heart disease Mother        bicuspid mitral valve   Hypertension Father    Dementia Father    Seizures Father        s/p TBI   Graves' disease Sister    Obesity Brother    Heart attack Maternal Aunt        deceased age 19   Heart disease Maternal Aunt  Stomach cancer Paternal Uncle    Hyperlipidemia Maternal Grandmother    Diabetes Maternal Grandmother    Kidney disease Maternal Grandmother    Hyperlipidemia Maternal Grandfather    Heart disease Maternal Grandfather 88       MI   Prostate cancer Maternal Grandfather    Cancer Maternal Grandfather    Hypertension Paternal Grandfather    Cancer Paternal Grandfather    Prostate cancer Other        maternal and paternal grandparents   Colon cancer Neg Hx    Esophageal cancer Neg Hx    Colon polyps Neg Hx    Rectal cancer Neg Hx     Social History   Socioeconomic History   Marital status: Widowed    Spouse name: Lynwood- deceased 16-Nov-2023  Number of children: 2   Years of education: 33   Highest  education level: 12th grade  Occupational History    Employer:   Tobacco Use   Smoking status: Former    Current packs/day: 0.25    Average packs/day: 0.3 packs/day for 7.0 years (1.8 ttl pk-yrs)    Types: Cigarettes   Smokeless tobacco: Never   Tobacco comments:    quit 2010  Vaping Use   Vaping status: Never Used  Substance and Sexual Activity   Alcohol use: Not Currently    Comment: Ocassionally   Drug use: Never   Sexual activity: Not Currently    Partners: Male    Birth control/protection: Post-menopausal    Comment: work at [employer], lives with fiance and son  Other Topics Concern   Not on file  Social History Narrative   Engaged, two sons.   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   No Tob, occ alcohol, no drugs.   Social Drivers of Corporate investment banker Strain: Low Risk  (03/02/2024)   Overall Financial Resource Strain (CARDIA)    Difficulty of Paying Living Expenses: Not very hard  Food Insecurity: No Food Insecurity (03/02/2024)   Hunger Vital Sign    Worried About Running Out of Food in the Last Year: Never true    Ran Out of Food in the Last Year: Never true  Transportation Needs: No Transportation Needs (03/02/2024)   PRAPARE - Administrator, Civil Service (Medical): No    Lack of Transportation (Non-Medical): No  Physical Activity: Sufficiently Active (03/02/2024)   Exercise Vital Sign    Days of Exercise per Week: 7 days    Minutes of Exercise per Session: 30 min  Stress: Stress Concern Present (03/02/2024)   [employer] of Occupational Health - Occupational Stress Questionnaire    Feeling of Stress: Very much  Social Connections: Moderately Integrated (03/02/2024)   Social Connection and Isolation Panel    Frequency of Communication with Friends and Family: Three times a week    Frequency of Social Gatherings with Friends and Family: Once a week    Attends Religious Services: 1 to  4 times per year    Active Member of [employer] or Organizations: Yes    Attends Banker Meetings: More than 4 times per year    Marital Status: Widowed  Intimate Partner Violence: Not on file    Outpatient Medications Prior to Visit  Medication Sig Dispense Refill   acetaminophen  (TYLENOL ) 500 MG tablet Take 1,000 mg by mouth every 6 (six) hours as needed for mild pain or moderate pain.  ALPRAZolam  (XANAX ) 0.25 MG tablet Take 1 tablet (0.25 mg total) by mouth 2 (two) times daily as needed for anxiety. 30 tablet 2   amLODipine  (NORVASC ) 5 MG tablet Take 1 tablet (5 mg total) by mouth daily. 90 tablet 1   atorvastatin  (LIPITOR) 10 MG tablet Take 1 tablet (10 mg total) by mouth daily. 90 tablet 0   cetirizine  (ZYRTEC ) 10 MG tablet Take 1 tablet (10 mg total) by mouth daily. 90 tablet 1   cholecalciferol (VITAMIN D3) 25 MCG (1000 UT) tablet Take 5,000 Units by mouth daily.     fluticasone  (FLONASE ) 50 MCG/ACT nasal spray Place 2 sprays into both nostrils daily. 16 g 6   hydrochlorothiazide  (MICROZIDE ) 12.5 MG capsule Take 1 capsule (12.5 mg total) by mouth daily as needed. For BP>145/90 90 capsule 1   meclizine  (ANTIVERT ) 50 MG tablet Take 1 tablet (50 mg total) by mouth 2 (two) times daily as needed for dizziness or nausea. 30 tablet 0   Multiple Vitamins-Minerals (MULTIVITAMIN WITH MINERALS) tablet Take 1 tablet by mouth daily.     promethazine  (PHENERGAN ) 12.5 MG tablet Take 1 tablet (12.5 mg total) by mouth every 8 (eight) hours as needed for nausea or vomiting. 15 tablet 0   SYNTHROID  175 MCG tablet Take 1 tablet (175 mcg total) by mouth daily before breakfast. 90 tablet 2   valACYclovir  (VALTREX ) 1000 MG tablet Take 1 tablet (1,000 mg total) by mouth 2 (two) times daily as needed. 90 tablet 0   Vitamin D , Ergocalciferol , (DRISDOL ) 1.25 MG (50000 UT) CAPS capsule Take 1 capsule (50,000 Units total) by mouth every 7 (seven) days. 4 capsule 4   naproxen  (NAPROSYN ) 500 MG tablet Take  1 tablet (500 mg total) by mouth 2 (two) times daily with a meal. 30 tablet 0   tizanidine  (ZANAFLEX ) 2 MG capsule Take 1 capsule (2 mg total) by mouth 3 (three) times daily as needed for muscle spasms. 30 capsule 0   No facility-administered medications prior to visit.    Allergies  Allergen Reactions   Zofran  [Ondansetron  Hcl] Other (See Comments)    Pt has hx of migraines; medication increases headaches and nausea    Amoxicillin  Nausea And Vomiting and Other (See Comments)    Has patient had a PCN reaction causing immediate rash, facial/tongue/throat swelling, SOB or lightheadedness with hypotension: Unknown Has patient had a PCN reaction causing severe rash involving mucus membranes or skin necrosis: No Has patient had a PCN reaction that required hospitalization: No Has patient had a PCN reaction occurring within the last 10 years: No If all of the above answers are NO, then may proceed with Cephalosporin use.    Codeine Nausea And Vomiting   Imitrex [Sumatriptan] Nausea And Vomiting   Ultram [Tramadol Hcl] Nausea And Vomiting    ROS See HPI    Objective:    Physical Exam Constitutional:      General: She is not in acute distress.    Appearance: She is obese. She is not ill-appearing, toxic-appearing or diaphoretic.  HENT:     Head: Normocephalic and atraumatic.     Right Ear: Tympanic membrane, ear canal and external ear normal.     Left Ear: Tympanic membrane, ear canal and external ear normal.     Nose: Nose normal. No congestion.     Mouth/Throat:     Mouth: Mucous membranes are moist.     Pharynx: Oropharynx is clear.  Eyes:     Extraocular Movements: Extraocular movements intact.  Right eye: Normal extraocular motion.     Left eye: Normal extraocular motion.     Conjunctiva/sclera: Conjunctivae normal.     Pupils: Pupils are equal, round, and reactive to light.  Neck:     Thyroid : No thyroid  mass or thyromegaly.     Vascular: No carotid bruit or JVD.   Cardiovascular:     Rate and Rhythm: Normal rate and regular rhythm.     Pulses: Normal pulses.          Radial pulses are 2+ on the right side and 2+ on the left side.       Dorsalis pedis pulses are 2+ on the right side and 2+ on the left side.     Heart sounds: Normal heart sounds, S1 normal and S2 normal. No murmur heard.    No friction rub. No gallop.  Pulmonary:     Effort: Pulmonary effort is normal. No respiratory distress.     Breath sounds: Normal breath sounds.  Abdominal:     General: Bowel sounds are normal. There is no distension.     Palpations: Abdomen is soft.     Tenderness: There is no abdominal tenderness. There is no guarding.  Musculoskeletal:        General: Normal range of motion.     Cervical back: Full passive range of motion without pain and normal range of motion. No edema or erythema.     Right lower leg: No edema.     Left lower leg: No edema.  Lymphadenopathy:     Cervical: No cervical adenopathy.  Skin:    General: Skin is warm and dry.     Capillary Refill: Capillary refill takes less than 2 seconds.  Neurological:     General: No focal deficit present.     Mental Status: She is alert and oriented to person, place, and time.     Cranial Nerves: No cranial nerve deficit.     Motor: No weakness.     Coordination: Coordination normal.     Gait: Gait normal.     Deep Tendon Reflexes: Reflexes normal.  Psychiatric:        Mood and Affect: Mood normal.        Behavior: Behavior normal.        Thought Content: Thought content normal.      BP 126/80 (BP Location: Right Arm, Patient Position: Sitting, Cuff Size: Normal)   Pulse 94   Temp 98.1 F (36.7 C) (Oral)   Resp 12   Ht 5' 3 (1.6 m)   Wt 187 lb (84.8 kg)   SpO2 97%   BMI 33.13 kg/m  Wt Readings from Last 3 Encounters:  03/02/24 187 lb (84.8 kg)  10/27/23 183 lb (83 kg)  02/14/23 183 lb (83 kg)       Assessment & Plan:   Problem List Items Addressed This Visit     Anxiety  and depression   Significant stressors including her husband's health, terminal illness.  Alprazolam  prn has been helpful and she limits use so refill given. Continue walking and add Yoga back       Class 1 obesity with serious comorbidity and body mass index (BMI) of 33.0 to 33.9 in adult   Encouraged DASH or MIND diet, decrease po intake and increase exercise as tolerated. Needs 7-8 hours of sleep nightly. Avoid trans fats, eat small, frequent meals every 4-5 hours with lean proteins, complex carbs and healthy fats. Minimize simple carbs, high fat  foods and processed foods       Relevant Orders   Comprehensive metabolic panel with GFR   Lipid panel   Essential hypertension   Well controlled, no changes to meds. Encouraged heart healthy diet such as the DASH diet and exercise as tolerated.        Relevant Orders   CBC with Differential/Platelet   High risk medication use   UDS and contract updated today      Relevant Orders   Drug Monitoring Panel [id number] , Urine   Hyperglycemia   hgba1c acceptable, minimize simple carbs. Increase exercise as tolerated. Continue current meds       Relevant Orders   Hemoglobin A1c   Hyperlipidemia   Tolerating statin, encouraged heart healthy diet, avoid trans fats, minimize simple carbs and saturated fats. Increase exercise as tolerated        Relevant Orders   Comprehensive metabolic panel with GFR   Lipid panel   Hypothyroidism   On Levothyroxine , continue to monitor         Relevant Orders   TSH   Low back pain   Follows with neurosurgery.  Patient has MRI scheduled for next week.  Continue follow-up.      Preventative health care   Patient encouraged to maintain heart healthy diet, regular exercise, adequate sleep. Consider daily probiotics. Take medications as prescribed. Labs ordered and reviewed    HCM: -Mammogram: Last perfromed 02/2023,WNL.  Due 8/ 2026. -Pap: Follows with GYN, plans to schedule - Colonoscopy: Last  02/2022; Next due 02/2027 (q 5 years)      Vitamin D  deficiency - Primary   Supplement and monitor.      Relevant Orders   VITAMIN D  25 Hydroxy (Vit-D Deficiency, Fractures)   Right hip pain, chronic Patient recently had imaging of the right hip revealing mild bone spurring.  Over the past 2 weeks, she has experienced worseinng pain in the right hip and upper right leg, She will continue with the planned MRI with NeuroSx next week. At this time, new hip pain most likely muscular. Recommend avoidance of overexertion, light stretching, use of OTC anti-inflammatories for symptoms. If symptoms persist or worsen RTC for further evaluation.    FU 6 months  Portions of this note were dictated using DRAGON voice recognition software. Please disregard any errors in transcription.     I have discontinued Arshia M. Messler's naproxen  and tizanidine . I am also having her maintain her acetaminophen , cholecalciferol, multivitamin with minerals, Vitamin D  (Ergocalciferol ), meclizine , promethazine , Synthroid , amLODipine , hydrochlorothiazide , cetirizine , fluticasone , valACYclovir , ALPRAZolam , and atorvastatin .  No orders of the defined types were placed in this encounter.

## 2024-02-26 NOTE — Assessment & Plan Note (Signed)
 Well controlled, no changes to meds. Encouraged heart healthy diet such as the DASH diet and exercise as tolerated.

## 2024-02-26 NOTE — Assessment & Plan Note (Addendum)
 Patient encouraged to maintain heart healthy diet, regular exercise, adequate sleep. Consider daily probiotics. Take medications as prescribed. Labs ordered and reviewed    HCM: -Mammogram: Last perfromed 02/2023,WNL.  Due 8/ 2026. -Pap: Follows with GYN, plans to schedule - Colonoscopy: Last 02/2022; Next due 02/2027 (q 5 years)

## 2024-02-26 NOTE — Assessment & Plan Note (Signed)
 Encouraged DASH or MIND diet, decrease po intake and increase exercise as tolerated. Needs 7-8 hours of sleep nightly. Avoid trans fats, eat small, frequent meals every 4-5 hours with lean proteins, complex carbs and healthy fats. Minimize simple carbs, high fat foods and processed foods

## 2024-02-26 NOTE — Assessment & Plan Note (Signed)
 Significant stressors including her husband's health, terminal illness.  Alprazolam  prn has been helpful and she limits use so refill given. Continue walking and add Yoga back

## 2024-02-26 NOTE — Assessment & Plan Note (Signed)
 hgba1c acceptable, minimize simple carbs. Increase exercise as tolerated. Continue current meds

## 2024-02-26 NOTE — Assessment & Plan Note (Signed)
 On Levothyroxine, continue to monitor

## 2024-03-02 ENCOUNTER — Ambulatory Visit (INDEPENDENT_AMBULATORY_CARE_PROVIDER_SITE_OTHER): Admitting: Student

## 2024-03-02 VITALS — BP 126/80 | HR 94 | Temp 98.1°F | Resp 12 | Ht 63.0 in | Wt 187.0 lb

## 2024-03-02 DIAGNOSIS — E038 Other specified hypothyroidism: Secondary | ICD-10-CM

## 2024-03-02 DIAGNOSIS — R739 Hyperglycemia, unspecified: Secondary | ICD-10-CM | POA: Diagnosis not present

## 2024-03-02 DIAGNOSIS — I10 Essential (primary) hypertension: Secondary | ICD-10-CM | POA: Diagnosis not present

## 2024-03-02 DIAGNOSIS — E559 Vitamin D deficiency, unspecified: Secondary | ICD-10-CM | POA: Diagnosis not present

## 2024-03-02 DIAGNOSIS — Z0000 Encounter for general adult medical examination without abnormal findings: Secondary | ICD-10-CM | POA: Diagnosis not present

## 2024-03-02 DIAGNOSIS — E66811 Obesity, class 1: Secondary | ICD-10-CM

## 2024-03-02 DIAGNOSIS — E785 Hyperlipidemia, unspecified: Secondary | ICD-10-CM

## 2024-03-02 DIAGNOSIS — Z79899 Other long term (current) drug therapy: Secondary | ICD-10-CM | POA: Insufficient documentation

## 2024-03-02 DIAGNOSIS — Z6833 Body mass index (BMI) 33.0-33.9, adult: Secondary | ICD-10-CM

## 2024-03-02 DIAGNOSIS — F419 Anxiety disorder, unspecified: Secondary | ICD-10-CM | POA: Diagnosis not present

## 2024-03-02 DIAGNOSIS — M545 Low back pain, unspecified: Secondary | ICD-10-CM | POA: Diagnosis not present

## 2024-03-02 DIAGNOSIS — F32A Depression, unspecified: Secondary | ICD-10-CM

## 2024-03-02 NOTE — Assessment & Plan Note (Signed)
 UDS and contract updated today

## 2024-03-02 NOTE — Patient Instructions (Addendum)
  [facility] Providers: [facility] located at [address], [place], [place].  [phone].    [facility] [address], [place], [place] [address] - [phone]  [facility] located at [address], [place], [place]. [phone].  [facility] located at [address], [place], [place].  [phone].  [facility] located at [address] [place], [place], [place].  [phone].  [facility] located at [address], [place], [place].  [phone].    [facility] located at [address] [place], [place], [place].  [phone].    [facility] - 4 local practices located at: [address] [place] [address] [place], [place].  [phone]. [address] [place], [place], [place].  [phone]. [address], [place], [place].  [phone]. [address], [place], [place]. [phone].  [facility] located at [address], [place], [place]. [phone].    [facility] located at [address], [place], [place] [address]- [phone]   [facility] located at [address] [place], [place] [address] [phone]   [facility] located at [address] [place], [place], [place] [address] = [phone]   [facility] located at [address], [place], [place] [address] = [phone]   [facility] [address] [place], [place] [address] - [phone]  [facility] [address] [place], [place], [place] [address]- [phone]   [facility] [address] [place], [place] [address]- [phone]  [facility] [address] [place], [place] -[phone]  [facility]- [address], [place], [place] [address]- [phone]  [facility]- [address] [place], [place], [place]- [phone] (several Locations)  [facility] - [address] [place], [place] [address]- [phone]   [facility]- [address] [place], [place], [place] [phone]  [facility]- [address], [place], [place] [address] [phone]    [facility]- [address], [place], [place] [address]- [phone]  [facility]- [address] [place], [place] [address]- [phone]  [facility] [address], [place], [place] [address]- [phone]. [facility] [place] [facility] [place] [phone] [facility] [address] [place] [phone] [facility]- [address] [place], [place], [place] - [phone]  [facility]- [address] [place], [place], [place] [address]- [phone]  [facility]- [address], [place] -  [phone]  [facility] - [address], [place]- [phone]. [facility] [email or web address] Address: [address], [place], [place] [address] Phone: [phone]. [phone]

## 2024-03-02 NOTE — Assessment & Plan Note (Signed)
 Follows with neurosurgery.  Patient has MRI scheduled for next week.  Continue follow-up.

## 2024-03-03 LAB — LIPID PANEL
Cholesterol: 213 mg/dL — ABNORMAL HIGH (ref <200)
HDL: 61 mg/dL (ref >=50)
LDL Cholesterol (Calc): 115 mg/dL — ABNORMAL HIGH
Non-HDL Cholesterol (Calc): 152 mg/dL — ABNORMAL HIGH (ref <130)
Total CHOL/HDL Ratio: 3.5 (calc) (ref <5.0)
Triglycerides: 246 mg/dL — ABNORMAL HIGH (ref <150)

## 2024-03-03 LAB — CBC WITH DIFFERENTIAL/PLATELET
Absolute Lymphocytes: 2105 {cells}/uL (ref 850–3900)
Absolute Monocytes: 555 {cells}/uL (ref 200–950)
Basophils Absolute: 61 {cells}/uL (ref 0–200)
Basophils Relative: 0.8 %
Eosinophils Absolute: 129 {cells}/uL (ref 15–500)
Eosinophils Relative: 1.7 %
HCT: 44.8 % (ref 35.0–45.0)
Hemoglobin: 14.8 g/dL (ref 11.7–15.5)
MCH: 31.2 pg (ref 27.0–33.0)
MCHC: 33 g/dL (ref 32.0–36.0)
MCV: 94.3 fL (ref 80.0–100.0)
MPV: 9.6 fL (ref 7.5–12.5)
Monocytes Relative: 7.3 %
Neutro Abs: 4750 {cells}/uL (ref 1500–7800)
Neutrophils Relative %: 62.5 %
Platelets: 422 Thousand/uL — ABNORMAL HIGH (ref 140–400)
RBC: 4.75 Million/uL (ref 3.80–5.10)
RDW: 11.6 % (ref 11.0–15.0)
Total Lymphocyte: 27.7 %
WBC: 7.6 Thousand/uL (ref 3.8–10.8)

## 2024-03-03 LAB — COMPREHENSIVE METABOLIC PANEL WITH GFR
AG Ratio: 1.5 (calc) (ref 1.0–2.5)
ALT: 18 U/L (ref 6–29)
AST: 16 U/L (ref 10–35)
Albumin: 4.3 g/dL (ref 3.6–5.1)
Alkaline phosphatase (APISO): 74 U/L (ref 37–153)
BUN: 10 mg/dL (ref 7–25)
CO2: 26 mmol/L (ref 20–32)
Calcium: 9.8 mg/dL (ref 8.6–10.4)
Chloride: 104 mmol/L (ref 98–110)
Creat: 0.71 mg/dL (ref 0.50–1.03)
Globulin: 2.9 g/dL (ref 1.9–3.7)
Glucose, Bld: 92 mg/dL (ref 65–99)
Potassium: 4.4 mmol/L (ref 3.5–5.3)
Sodium: 139 mmol/L (ref 135–146)
Total Bilirubin: 0.3 mg/dL (ref 0.2–1.2)
Total Protein: 7.2 g/dL (ref 6.1–8.1)
eGFR: 102 mL/min/1.73m2 (ref >=60)

## 2024-03-03 LAB — VITAMIN D 25 HYDROXY (VIT D DEFICIENCY, FRACTURES): Vit D, 25-Hydroxy: 38 ng/mL (ref 30–100)

## 2024-03-03 LAB — HEMOGLOBIN A1C
Hgb A1c MFr Bld: 5.1 % (ref <5.7)
Mean Plasma Glucose: 100 mg/dL
eAG (mmol/L): 5.5 mmol/L

## 2024-03-03 LAB — TSH: TSH: 5.19 m[IU]/L — ABNORMAL HIGH

## 2024-03-04 LAB — DRUG MONITORING PANEL 376104, URINE
Amphetamines: NEGATIVE ng/mL (ref <500)
Barbiturates: NEGATIVE ng/mL (ref <300)
Benzodiazepines: NEGATIVE ng/mL (ref <100)
Cocaine Metabolite: NEGATIVE ng/mL (ref <150)
Desmethyltramadol: NEGATIVE ng/mL (ref <100)
Opiates: NEGATIVE ng/mL (ref <100)
Oxycodone: NEGATIVE ng/mL (ref <100)
Tramadol: NEGATIVE ng/mL (ref <100)

## 2024-03-04 LAB — DM TEMPLATE

## 2024-03-07 ENCOUNTER — Other Ambulatory Visit: Payer: Self-pay

## 2024-03-07 ENCOUNTER — Ambulatory Visit: Payer: Self-pay | Admitting: Student

## 2024-03-07 DIAGNOSIS — E038 Other specified hypothyroidism: Secondary | ICD-10-CM

## 2024-03-07 DIAGNOSIS — F411 Generalized anxiety disorder: Secondary | ICD-10-CM | POA: Diagnosis not present

## 2024-03-07 MED ORDER — LEVOTHYROXINE SODIUM 200 MCG PO TABS
200.0000 ug | ORAL_TABLET | Freq: Every day | ORAL | 3 refills | Status: DC
Start: 2024-03-07 — End: 2024-03-08
  Filled 2024-03-07: qty 90, 90d supply, fill #0

## 2024-03-08 ENCOUNTER — Other Ambulatory Visit: Payer: Self-pay | Admitting: Student

## 2024-03-08 ENCOUNTER — Other Ambulatory Visit: Payer: Self-pay

## 2024-03-08 ENCOUNTER — Encounter: Payer: Self-pay | Admitting: Student

## 2024-03-08 DIAGNOSIS — M48061 Spinal stenosis, lumbar region without neurogenic claudication: Secondary | ICD-10-CM | POA: Diagnosis not present

## 2024-03-08 DIAGNOSIS — M5431 Sciatica, right side: Secondary | ICD-10-CM | POA: Diagnosis not present

## 2024-03-08 DIAGNOSIS — E038 Other specified hypothyroidism: Secondary | ICD-10-CM

## 2024-03-08 DIAGNOSIS — M5126 Other intervertebral disc displacement, lumbar region: Secondary | ICD-10-CM | POA: Diagnosis not present

## 2024-03-08 DIAGNOSIS — M5136 Other intervertebral disc degeneration, lumbar region with discogenic back pain only: Secondary | ICD-10-CM | POA: Diagnosis not present

## 2024-03-08 DIAGNOSIS — R29898 Other symptoms and signs involving the musculoskeletal system: Secondary | ICD-10-CM | POA: Diagnosis not present

## 2024-03-08 MED ORDER — SYNTHROID 200 MCG PO TABS
200.0000 ug | ORAL_TABLET | Freq: Every day | ORAL | 3 refills | Status: AC
  Filled 2024-03-08: qty 90, 90d supply, fill #0
  Filled 2024-03-12: qty 30, 30d supply, fill #0
  Filled 2024-04-26: qty 30, 30d supply, fill #1
  Filled 2024-06-12: qty 30, 30d supply, fill #2
  Filled 2024-07-23: qty 30, 30d supply, fill #3

## 2024-03-12 ENCOUNTER — Other Ambulatory Visit: Payer: Self-pay

## 2024-03-20 DIAGNOSIS — M5431 Sciatica, right side: Secondary | ICD-10-CM | POA: Diagnosis not present

## 2024-03-20 DIAGNOSIS — Z6832 Body mass index (BMI) 32.0-32.9, adult: Secondary | ICD-10-CM | POA: Diagnosis not present

## 2024-04-26 ENCOUNTER — Other Ambulatory Visit: Payer: Self-pay

## 2024-05-28 DIAGNOSIS — F411 Generalized anxiety disorder: Secondary | ICD-10-CM | POA: Diagnosis not present

## 2024-06-12 ENCOUNTER — Other Ambulatory Visit: Payer: Self-pay

## 2024-06-24 ENCOUNTER — Encounter: Payer: Self-pay | Admitting: Student

## 2024-06-24 ENCOUNTER — Other Ambulatory Visit: Payer: Self-pay | Admitting: Family Medicine

## 2024-06-25 ENCOUNTER — Encounter: Payer: Self-pay | Admitting: Sports Medicine

## 2024-06-25 ENCOUNTER — Ambulatory Visit: Admitting: Sports Medicine

## 2024-06-25 ENCOUNTER — Other Ambulatory Visit: Payer: Self-pay

## 2024-06-25 ENCOUNTER — Ambulatory Visit: Payer: Self-pay

## 2024-06-25 VITALS — BP 145/95 | HR 90 | Temp 98.3°F | Wt 177.8 lb

## 2024-06-25 DIAGNOSIS — N898 Other specified noninflammatory disorders of vagina: Secondary | ICD-10-CM | POA: Diagnosis not present

## 2024-06-25 DIAGNOSIS — I10 Essential (primary) hypertension: Secondary | ICD-10-CM | POA: Diagnosis not present

## 2024-06-25 DIAGNOSIS — N3000 Acute cystitis without hematuria: Secondary | ICD-10-CM

## 2024-06-25 LAB — POC URINALSYSI DIPSTICK (AUTOMATED)
Bilirubin, UA: NEGATIVE
Glucose, UA: NEGATIVE
Ketones, UA: NEGATIVE
Nitrite, UA: NEGATIVE
Protein, UA: POSITIVE — AB
Spec Grav, UA: 1.02 (ref 1.010–1.025)
Urobilinogen, UA: 0.2 U/dL
pH, UA: 6 (ref 5.0–8.0)

## 2024-06-25 MED ORDER — AMLODIPINE BESYLATE 5 MG PO TABS
5.0000 mg | ORAL_TABLET | Freq: Every day | ORAL | 1 refills | Status: AC
  Filled 2024-06-25: qty 90, 90d supply, fill #0

## 2024-06-25 MED ORDER — FLUCONAZOLE 150 MG PO TABS
150.0000 mg | ORAL_TABLET | Freq: Once | ORAL | 0 refills | Status: AC
  Filled 2024-06-25: qty 1, 1d supply, fill #0

## 2024-06-25 MED ORDER — NITROFURANTOIN MONOHYD MACRO 100 MG PO CAPS
100.0000 mg | ORAL_CAPSULE | Freq: Two times a day (BID) | ORAL | 0 refills | Status: AC
  Filled 2024-06-25: qty 10, 5d supply, fill #0

## 2024-06-25 MED ORDER — ATORVASTATIN CALCIUM 10 MG PO TABS
10.0000 mg | ORAL_TABLET | Freq: Every day | ORAL | 1 refills | Status: AC
  Filled 2024-06-25: qty 90, 90d supply, fill #0

## 2024-06-25 NOTE — Telephone Encounter (Signed)
 FYI Only or Action Required?: FYI only for provider: appointment scheduled on 06/25/24.  Patient was last seen in primary care on 03/02/2024 by Wheeler Harlene CROME, NP.  Called Nurse Triage reporting Vaginal Discharge.  Symptoms began yesterday.  Interventions attempted: Nothing.  Symptoms are: stable.  Triage Disposition: See PCP When Office is Open (Within 3 Days)  Patient/caregiver understands and will follow disposition?: Yes  Reason for Disposition  Bad smelling vaginal discharge  Answer Assessment - Initial Assessment Questions Patient has not taken any OTC treatments. Reports drinking a lot of coffee with sugar/creamer the other week and is unsure if that's contributing.   1. DISCHARGE: Describe the discharge. (e.g., white, yellow, green, gray, foamy, cottage cheese-like)     White   2. ODOR: Is there a bad odor?     Abnormal  3. ONSET: When did the discharge begin?     Yesterday  4. RASH: Is there a rash in the genital area? If Yes, ask: Describe it. (e.g., redness, blisters, sores, bumps)     Denies  5. ABDOMEN PAIN: Are you having any abdomen pain? If Yes, ask: What does it feel like?  (e.g., crampy, dull, intermittent, constant)      Denies  6. ABDOMEN PAIN SEVERITY: If present, ask: How bad is it? (e.g., Scale 1-10; mild, moderate, or severe)     N/a  7. CAUSE: What do you think is causing the discharge? Have you had the same problem before? What happened then?     Unsure  8. OTHER SYMPTOMS: Do you have any other symptoms? (e.g., fever, itching, urination pain, vaginal bleeding, vaginal foreign body)     Itching, thick white vaginal discharge  Protocols used: Vaginal Discharge-A-AH

## 2024-06-25 NOTE — Telephone Encounter (Signed)
 Disconnected upon transfer to NT. Attempted to call patient back, left message with office call back number.  Copied from CRM [id number]. Topic: Clinical - Red Word Triage >> Jun 25, 2024  8:07 AM Katie Henry wrote: Red Word that prompted transfer to Nurse Triage: having UTI symptoms burning, irritation with discharge ( white discharge)

## 2024-06-25 NOTE — Progress Notes (Signed)
 Careteam: Patient Care Team: Domenica Harlene LABOR, MD as PCP - General (Family Medicine)  Allergies  Allergen Reactions   Zofran  [Ondansetron  Hcl] Other (See Comments)    Pt has hx of migraines; medication increases headaches and nausea    Amoxicillin  Nausea And Vomiting and Other (See Comments)    Has patient had a PCN reaction causing immediate rash, facial/tongue/throat swelling, SOB or lightheadedness with hypotension: Unknown Has patient had a PCN reaction causing severe rash involving mucus membranes or skin necrosis: No Has patient had a PCN reaction that required hospitalization: No Has patient had a PCN reaction occurring within the last 10 years: No If all of the above answers are NO, then may proceed with Cephalosporin use.    Codeine Nausea And Vomiting   Imitrex [Sumatriptan] Nausea And Vomiting   Ultram [Tramadol Hcl] Nausea And Vomiting    Chief Complaint  Patient presents with   Vaginal Itching    Pt started having vaginal itching and discharge that started yesterday.    Discussed the use of AI scribe software for clinical note transcription with the patient, who gave verbal consent to proceed.  History of Present Illness    Katie Henry is a 53 year old female with hypertension who presents with vaginal irritation and discharge.  She has experienced vaginal irritation and discharge since yesterday, describing the discharge as whitish with an unpleasant smell. She associates these symptoms with increased consumption of coffee with sugar and creamer, as well as wine. No fever, lower abdominal pain, nausea, vomiting, lower back pain, blood in urine or stools, or dizziness. Increased frequency of urination is noted since yesterday, but there is no burning sensation during urination. She feels she empties her bladder completely and attributes frequent urination to increased water intake.  She has not taken any recent antibiotics and is allergic to penicillin.  She  takes blood pressure medication, usually at lunchtime, and monitors her blood pressure at home, noting it has been slightly elevated, possibly due to stress from the holidays and the recent loss of her husband. She reports sleeping adequately and maintains a stable appetite, eating one good meal a day and snacking. No significant weight loss is noted, and she reports improvement in her emotional state.  She is not diabetic and monitors her sugar intake, using monkfruit sweetener instead of sugar. Recently, she consumed more sugar than usual, drinking cappuccinos and wine over the past two weeks. Lab Results  Component Value Date   HGBA1C 5.1 03/02/2024   HGBA1C 4.7 12/22/2022   HGBA1C 4.8 05/13/2022     Review of Systems:  Review of Systems  Constitutional:  Negative for chills and fever.  HENT:  Negative for congestion and sore throat.   Eyes:  Negative for double vision.  Respiratory:  Negative for cough, sputum production and shortness of breath.   Cardiovascular:  Negative for chest pain, palpitations and leg swelling.  Gastrointestinal:  Negative for abdominal pain, heartburn and nausea.  Genitourinary:  Positive for frequency and urgency. Negative for dysuria and hematuria.       Vaginal discharge - whitish, odor + Vaginal itching +  Musculoskeletal:  Negative for falls and myalgias.  Neurological:  Negative for dizziness, sensory change and focal weakness.   Negative unless indicated in HPI.   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   Colon polyp 02/04/2021   Decreased visual acuity 02/04/2021  Acute pain of right knee 09/26/2019   Low back pain 05/20/2019   Essential hypertension 05/15/2019   Class 1 obesity with serious comorbidity and body mass index (BMI) of 33.0 to 33.9 in adult 05/15/2019   Tinea corporis 11/27/2018   Rectal bleeding 11/27/2018   Hyperlipidemia 12/13/2017   Plantar  fasciitis 01/11/2017   Tailor's bunion 01/11/2017   Grief reaction 11/15/2016   Hyperglycemia 11/15/2016   Preventative health care 10/14/2013   Graves disease    Endometriosis    IBS (irritable bowel syndrome)    Syncope    Heel pain 07/17/2012   Migraines 01/13/2012   Kidney stones 01/13/2012   Breast cancer screening 07/08/2011   Vitamin D  deficiency 05/23/2011   Hypothyroidism 05/23/2011   Anxiety and depression 05/23/2011   Past Medical History:  Diagnosis Date   Anxiety    Back pain    Dental infection 05/28/2011   Endometriosis    Fainting episodes    Dr Lorenza related   Graves disease    Grief reaction 11/15/2016   History of kidney stones 2003   onset with pregnacy    History of UTI    last 4-5-weeks ago   Hyperglycemia 11/15/2016   cbg 100   Hypertension 2023   Taking medicine   Hyperthyroidism    Hypothyroidism    hypo after radioactibe iodine   IBS (irritable bowel syndrome)    Migraine    Syncope    Vitamin D  deficiency    Past Surgical History:  Procedure Laterality Date    treatment  1998   ABDOMINAL HYSTERECTOMY  2010   [facility]   APPENDECTOMY  1991   COLONOSCOPY  2020   [facility]   CYSTOSCOPY/URETEROSCOPY/HOLMIUM LASER/STENT PLACEMENT Left 08/09/2017   Procedure: CYSTOSCOPY/RETROGRADE/URETEROSCOPY/HOLMIUM LASER/STENT PLACEMENT;  Surgeon: Nieves Cough, MD;  Location: [facility];  Service: Urology;  Laterality: Left;  ONLY NEEDS 60 MIN   ENDOMETRIAL ABLATION  01/2007   EXTRACORPOREAL SHOCK WAVE LITHOTRIPSY Left 07/21/2017   Procedure: LEFT EXTRACORPOREAL SHOCK WAVE LITHOTRIPSY (ESWL);  Surgeon: Gaston Hamilton, MD;  Location: [facility];  Service: Urology;  Laterality: Left;   VAGINAL HYSTERECTOMY  2010   ovaries still in place   Social History   Tobacco Use   Smoking status: Former    Current packs/day: 0.25    Average packs/day: 0.3 packs/day for 7.0 years (1.8 ttl pk-yrs)    Types: Cigarettes   Smokeless tobacco: Never   Tobacco  comments:    quit 2010  Vaping Use   Vaping status: Never Used  Substance Use Topics   Alcohol use: Not Currently    Comment: Ocassionally   Drug use: Never   Family History  Problem Relation Age of Onset   Hyperlipidemia Mother    Heart disease Mother        bicuspid mitral valve   Hypertension Father    Dementia Father    Seizures Father        s/p TBI   Graves' disease Sister    Obesity Brother    Heart attack Maternal Aunt        deceased age 41   Heart disease Maternal Aunt    Stomach cancer Paternal Uncle    Hyperlipidemia Maternal Grandmother    Diabetes Maternal Grandmother    Kidney disease Maternal Grandmother    Hyperlipidemia Maternal Grandfather    Heart disease Maternal Grandfather 32       MI   Prostate cancer Maternal Grandfather    Cancer Maternal Grandfather  Hypertension Paternal Grandfather    Cancer Paternal Grandfather    Prostate cancer Other        maternal and paternal grandparents   Colon cancer Neg Hx    Esophageal cancer Neg Hx    Colon polyps Neg Hx    Rectal cancer Neg Hx    Allergies  Allergen Reactions   Zofran  [Ondansetron  Hcl] Other (See Comments)    Pt has hx of migraines; medication increases headaches and nausea    Amoxicillin  Nausea And Vomiting and Other (See Comments)    Has patient had a PCN reaction causing immediate rash, facial/tongue/throat swelling, SOB or lightheadedness with hypotension: Unknown Has patient had a PCN reaction causing severe rash involving mucus membranes or skin necrosis: No Has patient had a PCN reaction that required hospitalization: No Has patient had a PCN reaction occurring within the last 10 years: No If all of the above answers are NO, then may proceed with Cephalosporin use.    Codeine Nausea And Vomiting   Imitrex [Sumatriptan] Nausea And Vomiting   Ultram [Tramadol Hcl] Nausea And Vomiting    Medications: Patient's Medications  New Prescriptions   FLUCONAZOLE  (DIFLUCAN ) 150 MG  TABLET    Take 1 tablet (150 mg total) by mouth once for 1 dose.   NITROFURANTOIN , MACROCRYSTAL-MONOHYDRATE, (MACROBID ) 100 MG CAPSULE    Take 1 capsule (100 mg total) by mouth 2 (two) times daily for 5 days.  Previous Medications   ACETAMINOPHEN  (TYLENOL ) 500 MG TABLET    Take 1,000 mg by mouth every 6 (six) hours as needed for mild pain or moderate pain.   ALPRAZOLAM  (XANAX ) 0.25 MG TABLET    Take 1 tablet (0.25 mg total) by mouth 2 (two) times daily as needed for anxiety.   AMLODIPINE  (NORVASC ) 5 MG TABLET    Take 1 tablet (5 mg total) by mouth daily.   ATORVASTATIN  (LIPITOR) 10 MG TABLET    Take 1 tablet (10 mg total) by mouth daily.   CETIRIZINE  (ZYRTEC ) 10 MG TABLET    Take 1 tablet (10 mg total) by mouth daily.   CHOLECALCIFEROL (VITAMIN D3) 25 MCG (1000 UT) TABLET    Take 5,000 Units by mouth daily.   FLUTICASONE  (FLONASE ) 50 MCG/ACT NASAL SPRAY    Place 2 sprays into both nostrils daily.   HYDROCHLOROTHIAZIDE  (MICROZIDE ) 12.5 MG CAPSULE    Take 1 capsule (12.5 mg total) by mouth daily as needed. For BP>145/90   MECLIZINE  (ANTIVERT ) 50 MG TABLET    Take 1 tablet (50 mg total) by mouth 2 (two) times daily as needed for dizziness or nausea.   MULTIPLE VITAMINS-MINERALS (MULTIVITAMIN WITH MINERALS) TABLET    Take 1 tablet by mouth daily.   PROMETHAZINE  (PHENERGAN ) 12.5 MG TABLET    Take 1 tablet (12.5 mg total) by mouth every 8 (eight) hours as needed for nausea or vomiting.   SYNTHROID  200 MCG TABLET    Take 1 tablet (200 mcg total) by mouth daily before breakfast.   VALACYCLOVIR  (VALTREX ) 1000 MG TABLET    Take 1 tablet (1,000 mg total) by mouth 2 (two) times daily as needed.   VITAMIN D , ERGOCALCIFEROL , (DRISDOL ) 1.25 MG (50000 UT) CAPS CAPSULE    Take 1 capsule (50,000 Units total) by mouth every 7 (seven) days.  Modified Medications   No medications on file  Discontinued Medications   No medications on file    Physical Exam: Vitals:   06/25/24 1016  BP: (!) 160/100  Pulse: 90   Temp:  98.3 F (36.8 C)  TempSrc: Oral  SpO2: 97%  Weight: 177 lb 12.8 oz (80.6 kg)   Body mass index is 31.5 kg/m. BP Readings from Last 3 Encounters:  06/25/24 (!) 160/100  03/02/24 126/80  10/27/23 120/82   Wt Readings from Last 3 Encounters:  06/25/24 177 lb 12.8 oz (80.6 kg)  03/02/24 187 lb (84.8 kg)  10/27/23 183 lb (83 kg)    Physical Exam Constitutional:      Appearance: Normal appearance.  HENT:     Head: Normocephalic and atraumatic.  Cardiovascular:     Rate and Rhythm: Normal rate and regular rhythm.  Pulmonary:     Effort: Pulmonary effort is normal. No respiratory distress.     Breath sounds: Normal breath sounds. No wheezing.  Abdominal:     General: Bowel sounds are normal. There is no distension.     Tenderness: There is no abdominal tenderness. There is no guarding or rebound.     Comments:    Musculoskeletal:        General: No swelling or tenderness.  Skin:    Findings: No rash.  Neurological:     Mental Status: She is alert. Mental status is at baseline.     Sensory: No sensory deficit.     Motor: No weakness.     Labs reviewed: Basic Metabolic Panel: Recent Labs    03/02/24 1537  NA 139  K 4.4  CL 104  CO2 26  GLUCOSE 92  BUN 10  CREATININE 0.71  CALCIUM  9.8  TSH 5.19*   Liver Function Tests: Recent Labs    03/02/24 1537  AST 16  ALT 18  BILITOT 0.3  PROT 7.2   No results for input(s): LIPASE, AMYLASE in the last 8760 hours. No results for input(s): AMMONIA in the last 8760 hours. CBC: Recent Labs    03/02/24 1537  WBC 7.6  NEUTROABS 4,750  HGB 14.8  HCT 44.8  MCV 94.3  PLT 422*   Lipid Panel: Recent Labs    03/02/24 1537  CHOL 213*  HDL 61  LDLCALC 115*  TRIG 246*  CHOLHDL 3.5   TSH: Recent Labs    03/02/24 1537  TSH 5.19*   A1C: Lab Results  Component Value Date   HGBA1C 5.1 03/02/2024    Assessment & Plan Vaginal itching Pt c/o vaginal discharge and irritation  Will send  fluconazole  to her pharmacy Orders:   POCT Urinalysis Dipstick (Automated)  Vaginal discharge Denies lower abdominal pain, fever Afebrile  Will send fluconazole  to her pharmacy [facility]  Orders:   NuSwab Vaginitis Plus (VG+)   fluconazole  (DIFLUCAN ) 150 MG tablet; Take 1 tablet (150 mg total) by mouth once for 1 dose.  Primary hypertension She did not take morning meds Cont with bp meds Monitor bp regularly , follow up if bp stays above 140/80 Avoid salty foods Exercise regularly    Acute cystitis without hematuria Urine dipstick neg Will send macrobid   Will send urine for culture Orders:   nitrofurantoin , macrocrystal-monohydrate, (MACROBID ) 100 MG capsule; Take 1 capsule (100 mg total) by mouth 2 (two) times daily for 5 days.   Urine Culture

## 2024-06-25 NOTE — Telephone Encounter (Signed)
 Appt scheduled

## 2024-06-27 ENCOUNTER — Encounter: Payer: Self-pay | Admitting: Sports Medicine

## 2024-06-27 ENCOUNTER — Other Ambulatory Visit: Payer: Self-pay | Admitting: Sports Medicine

## 2024-06-27 ENCOUNTER — Other Ambulatory Visit: Payer: Self-pay

## 2024-06-27 MED ORDER — METRONIDAZOLE 500 MG PO TABS
500.0000 mg | ORAL_TABLET | Freq: Two times a day (BID) | ORAL | 0 refills | Status: AC
  Filled 2024-06-27: qty 14, 7d supply, fill #0

## 2024-06-27 NOTE — Progress Notes (Signed)
 Sent metronidazole  500 mg twice daily to pharmacy

## 2024-06-27 NOTE — Telephone Encounter (Signed)
 No further action needed at this time.

## 2024-06-28 ENCOUNTER — Ambulatory Visit: Payer: Self-pay | Admitting: Sports Medicine

## 2024-06-28 LAB — NUSWAB VAGINITIS PLUS (VG+)
Atopobium vaginae: HIGH {score} — AB
BVAB 2: HIGH {score} — AB
Candida albicans, NAA: NEGATIVE
Candida glabrata, NAA: NEGATIVE
Chlamydia trachomatis, NAA: NEGATIVE
Megasphaera 1: HIGH {score} — AB
Neisseria gonorrhoeae, NAA: NEGATIVE
Trich vag by NAA: POSITIVE — AB

## 2024-07-23 ENCOUNTER — Other Ambulatory Visit: Payer: Self-pay

## 2024-08-16 ENCOUNTER — Encounter: Payer: Self-pay | Admitting: Sports Medicine

## 2024-09-06 ENCOUNTER — Ambulatory Visit: Admitting: Family Medicine

## 2024-09-14 ENCOUNTER — Encounter: Admitting: Sports Medicine
